# Patient Record
Sex: Female | Born: 1955
Health system: Southern US, Community
[De-identification: ages and names within clinical notes are randomized; demographics above are authoritative.]

## PROBLEM LIST (undated history)

## (undated) DIAGNOSIS — D649 Anemia, unspecified: Secondary | ICD-10-CM

## (undated) DIAGNOSIS — K2970 Gastritis, unspecified, without bleeding: Secondary | ICD-10-CM

## (undated) DIAGNOSIS — M549 Dorsalgia, unspecified: Secondary | ICD-10-CM

## (undated) DIAGNOSIS — M1990 Unspecified osteoarthritis, unspecified site: Secondary | ICD-10-CM

## (undated) DIAGNOSIS — E119 Type 2 diabetes mellitus without complications: Secondary | ICD-10-CM

## (undated) DIAGNOSIS — I10 Essential (primary) hypertension: Secondary | ICD-10-CM

## (undated) DIAGNOSIS — M25569 Pain in unspecified knee: Secondary | ICD-10-CM

## (undated) HISTORY — DX: Type 2 diabetes mellitus without complications: E11.9

## (undated) HISTORY — DX: Pain in unspecified knee: M25.569

## (undated) HISTORY — PX: OTHER SURGICAL HISTORY: SHX169

## (undated) HISTORY — DX: Essential (primary) hypertension: I10

## (undated) HISTORY — DX: Dorsalgia, unspecified: M54.9

## (undated) HISTORY — DX: Anemia, unspecified: D64.9

## (undated) HISTORY — DX: Gastritis, unspecified, without bleeding: K29.70

---

## 1999-06-21 ENCOUNTER — Encounter: Admission: RE | Admit: 1999-06-21 | Discharge: 1999-06-21 | Payer: Self-pay | Admitting: Neurosurgery

## 2000-10-22 ENCOUNTER — Other Ambulatory Visit: Admission: RE | Admit: 2000-10-22 | Discharge: 2000-10-22 | Payer: Self-pay | Admitting: *Deleted

## 2001-05-22 ENCOUNTER — Encounter: Payer: Self-pay | Admitting: Family Medicine

## 2001-05-22 ENCOUNTER — Ambulatory Visit (HOSPITAL_COMMUNITY): Admission: RE | Admit: 2001-05-22 | Discharge: 2001-05-22 | Payer: Self-pay | Admitting: Family Medicine

## 2001-06-18 ENCOUNTER — Other Ambulatory Visit: Admission: RE | Admit: 2001-06-18 | Discharge: 2001-06-18 | Payer: Self-pay | Admitting: Family Medicine

## 2002-01-27 ENCOUNTER — Emergency Department (HOSPITAL_COMMUNITY): Admission: EM | Admit: 2002-01-27 | Discharge: 2002-01-27 | Payer: Self-pay | Admitting: Emergency Medicine

## 2002-01-27 ENCOUNTER — Encounter: Payer: Self-pay | Admitting: Emergency Medicine

## 2003-05-18 ENCOUNTER — Ambulatory Visit (HOSPITAL_COMMUNITY): Admission: RE | Admit: 2003-05-18 | Discharge: 2003-05-18 | Payer: Self-pay | Admitting: Family Medicine

## 2004-10-05 ENCOUNTER — Ambulatory Visit (HOSPITAL_COMMUNITY): Admission: RE | Admit: 2004-10-05 | Discharge: 2004-10-05 | Payer: Self-pay | Admitting: Family Medicine

## 2005-09-18 ENCOUNTER — Ambulatory Visit (HOSPITAL_COMMUNITY): Admission: RE | Admit: 2005-09-18 | Discharge: 2005-09-18 | Payer: Self-pay | Admitting: Family Medicine

## 2006-09-24 ENCOUNTER — Emergency Department (HOSPITAL_COMMUNITY): Admission: EM | Admit: 2006-09-24 | Discharge: 2006-09-24 | Payer: Self-pay | Admitting: Emergency Medicine

## 2007-04-08 ENCOUNTER — Other Ambulatory Visit: Admission: RE | Admit: 2007-04-08 | Discharge: 2007-04-08 | Payer: Self-pay | Admitting: Obstetrics and Gynecology

## 2007-07-28 ENCOUNTER — Emergency Department (HOSPITAL_COMMUNITY): Admission: EM | Admit: 2007-07-28 | Discharge: 2007-07-28 | Payer: Self-pay | Admitting: Family Medicine

## 2008-04-23 ENCOUNTER — Ambulatory Visit (HOSPITAL_COMMUNITY): Admission: RE | Admit: 2008-04-23 | Discharge: 2008-04-23 | Payer: Self-pay | Admitting: Family Medicine

## 2008-04-27 ENCOUNTER — Other Ambulatory Visit: Admission: RE | Admit: 2008-04-27 | Discharge: 2008-04-27 | Payer: Self-pay | Admitting: Obstetrics and Gynecology

## 2008-05-01 ENCOUNTER — Ambulatory Visit (HOSPITAL_COMMUNITY): Admission: RE | Admit: 2008-05-01 | Discharge: 2008-05-01 | Payer: Self-pay | Admitting: Obstetrics and Gynecology

## 2010-06-29 ENCOUNTER — Other Ambulatory Visit
Admission: RE | Admit: 2010-06-29 | Discharge: 2010-06-29 | Payer: Self-pay | Source: Home / Self Care | Admitting: Obstetrics & Gynecology

## 2011-01-19 ENCOUNTER — Encounter: Payer: Self-pay | Admitting: Gastroenterology

## 2011-01-19 ENCOUNTER — Ambulatory Visit (INDEPENDENT_AMBULATORY_CARE_PROVIDER_SITE_OTHER): Payer: BC Managed Care – PPO | Admitting: Gastroenterology

## 2011-01-19 VITALS — BP 126/77 | HR 86 | Temp 97.7°F | Ht 66.0 in | Wt 272.0 lb

## 2011-01-19 DIAGNOSIS — D649 Anemia, unspecified: Secondary | ICD-10-CM

## 2011-01-19 NOTE — Progress Notes (Signed)
Referring Provider: Ardyth Gal, MD Primary Care Physician:  Harlow Asa, MD, MD Primary Gastroenterologist:  Dr. Darrick Penna   Chief Complaint  Patient presents with  . Anemia    HPI:   Tracey Turner presents today for a visit prior to initial screening colonoscopy. Somewhat overdue. Recent labs with incidental findings of anemia, Hgb 10, Hct 33, Iron slightly low at 33, Ferritin normal at 52. She has started taking supplemental iron. She actually saw Dr. Elnoria Howard prior to [place] for colonoscopy; however, they required payment up front. She has insurance, but was unable to pay the amount they were requesting. She denies abdominal pain. Hx of paper hematochezia in past. Reports hx of hemorrhoids. Denies exacerbation currently. BM at least twice per day. No melena. No N/V, no lack of appetite, has lost 30-40 lbs reportedly over last few years due to DM diagnosis. Denies dysphagia/odynophagia. No reflux.   Hemoccult negative X 3 from Dr. Fletcher Anon office. Remote hx of naprosyn for knee pain in past, stopped after blood work received in June.   Past Medical History  Diagnosis Date  . Knee pain   . Anemia   . Hypertension   . Diabetes mellitus   . Glaucoma     questionable, getting second opinion    Past Surgical History  Procedure Date  . None     Current Outpatient Prescriptions  Medication Sig Dispense Refill  . amLODipine (NORVASC) 10 MG tablet Take 10 mg by mouth daily.        Marland Kitchen aspirin 81 MG tablet Take 81 mg by mouth daily.        . ferrous sulfate 325 (65 FE) MG tablet Take 325 mg by mouth daily with breakfast.        . metFORMIN (GLUCOPHAGE) 1000 MG tablet Take 1,000 mg by mouth 2 (two) times daily with a meal.        . metoprolol (LOPRESSOR) 50 MG tablet Take 50 mg by mouth 2 (two) times daily.        . ranitidine (ZANTAC) 150 MG capsule Take 150 mg by mouth 2 (two) times daily.        . traMADol (ULTRAM) 50 MG tablet Take 50 mg by mouth every 6 (six) hours as needed.        .  Vitamin D, Ergocalciferol, (DRISDOL) 50000 UNITS CAPS Take 50,000 Units by mouth.        . cyclobenzaprine (FLEXERIL) 5 MG tablet       . naproxen (NAPROSYN) 500 MG tablet         Allergies as of 01/19/2011 - Review Complete 01/19/2011  Allergen Reaction Noted  . Ace inhibitors Shortness Of Breath and Swelling 01/19/2011    Family History  Problem Relation Age of Onset  . Colon cancer Neg Hx   . Prostate cancer Father     History   Social History  . Marital Status: Single    Spouse Name: N/A    Number of Children: N/A  . Years of Education: N/A   Occupational History  . [employer]    Social History Main Topics  . Smoking status: Former Smoker -- 1.0 packs/day    Types: Cigarettes  . Smokeless tobacco: Former Neurosurgeon    Quit date: 06/21/2007  . Alcohol Use: No  . Drug Use: No  . Sexually Active: Not on file   Other Topics Concern  . Not on file   Social History Narrative  . No narrative on file  Review of Systems: Gen: Denies any fever, chills, loss of appetite, fatigue, unintentional weight loss. CV: Denies chest pain, heart palpitations, syncope, peripheral edema. Resp: Denies shortness of breath with rest, cough, wheezing. +DOE, relieved with rest.  GI: Denies dysphagia or odynophagia. Denies hematemesis, fecal incontinence, or jaundice.  GU : Denies urinary burning, urinary frequency, urinary incontinence.  MS: Denies joint pain, muscle weakness, cramps, limited movement Derm: Denies rash, itching, dry skin Psych: Denies depression, anxiety, confusion or memory loss  Heme: Denies bruising, bleeding, and enlarged lymph nodes.  Physical Exam: BP 126/77  Pulse 86  Temp(Src) 97.7 F (36.5 C) (Temporal)  Ht 5\' 6"  (1.676 m)  Wt 272 lb (123.378 kg)  BMI 43.90 kg/m2 General:   Alert and oriented. Well-developed, well-nourished, pleasant and cooperative. Obese.  Head:  Normocephalic and atraumatic. Eyes:  Conjunctiva pink, sclera clear, no icterus.      Ears:  Normal auditory acuity. Nose:  No deformity, discharge,  or lesions. Mouth:  No deformity or lesions, mucosa pink and moist.  Neck:  Supple, without mass or thyromegaly. Lungs:  Clear to auscultation bilaterally, without wheezing, rales, or rhonchi.  Heart:  S1, S2 present without murmurs noted.  Abdomen:  +BS, soft, obese, non-tender and non-distended. Without mass or HSM. No rebound or guarding. No hernias noted. Rectal:  Deferred  Msk:  Symmetrical without gross deformities. Normal posture. Extremities:  Without clubbing or edema. Neurologic:  Alert and  oriented x4;  grossly normal neurologically. Skin:  Intact, warm and dry without significant lesions or rashes Cervical Nodes:  No significant cervical adenopathy. Psych:  Alert and cooperative. Normal mood and affect.

## 2011-01-19 NOTE — Patient Instructions (Addendum)
We have set you up for a colonoscopy and possible an endoscopy depending on the findings at time of the procedure.  Do not take your diabetes medicine the morning of the procedure.   Further recommendations to follow.

## 2011-01-20 DIAGNOSIS — D649 Anemia, unspecified: Secondary | ICD-10-CM | POA: Insufficient documentation

## 2011-01-20 NOTE — Assessment & Plan Note (Signed)
55 year old female with likely iron deficiency anemia, found incidentally on routine labs. No prior hx of colonoscopy. Completely devoid of upper GI symptoms at this time. No abdominal pain, constipation, change in bowel habits, melena. Has noted paper hematochezia in past; self-reports hx of hemorrhoids. Taking iron currently. Hemoccult cards negative X 3 from outside office. Needs colonoscopy, possible upper endoscopy depending on findings to assess for occult malignancy. Other non-GI source for anemia remains in differential.  Proceed with colonoscopy (possible EGD) with Dr. Darrick Penna in the near future. The risks, benefits, and alternatives have been discussed in detail with the patient. They state understanding and desire to proceed.  Hold iron X 7 days prior to procedure No diabetes medication morning of procedure

## 2011-01-20 NOTE — Progress Notes (Signed)
Cc to PCP 

## 2011-01-23 ENCOUNTER — Other Ambulatory Visit: Payer: Self-pay | Admitting: General Practice

## 2011-01-23 DIAGNOSIS — D509 Iron deficiency anemia, unspecified: Secondary | ICD-10-CM

## 2011-01-26 NOTE — Progress Notes (Signed)
?   EARLY FeDA V. ACD in light of normal Ferritin. Agree with TCS/?EGD. Pt may have metformin on AM of her procedure.

## 2011-02-10 MED ORDER — SODIUM CHLORIDE 0.45 % IV SOLN
Freq: Once | INTRAVENOUS | Status: AC
  Administered 2011-02-13: 12:00:00 via INTRAVENOUS

## 2011-02-13 ENCOUNTER — Encounter (HOSPITAL_COMMUNITY)
Admission: RE | Disposition: A | Discharge status: Home / Self Care | Payer: Self-pay | Source: Ambulatory Visit | Attending: Gastroenterology

## 2011-02-13 ENCOUNTER — Encounter (HOSPITAL_COMMUNITY): Payer: Self-pay | Admitting: *Deleted

## 2011-02-13 ENCOUNTER — Ambulatory Visit (HOSPITAL_COMMUNITY)
Admission: RE | Admit: 2011-02-13 | Discharge: 2011-02-13 | Disposition: A | Discharge status: Home / Self Care | Payer: BC Managed Care – PPO | Source: Ambulatory Visit | Attending: Gastroenterology | Admitting: Gastroenterology

## 2011-02-13 ENCOUNTER — Other Ambulatory Visit: Payer: Self-pay | Admitting: Gastroenterology

## 2011-02-13 DIAGNOSIS — K648 Other hemorrhoids: Secondary | ICD-10-CM

## 2011-02-13 DIAGNOSIS — K2980 Duodenitis without bleeding: Secondary | ICD-10-CM | POA: Insufficient documentation

## 2011-02-13 DIAGNOSIS — K2970 Gastritis, unspecified, without bleeding: Secondary | ICD-10-CM

## 2011-02-13 DIAGNOSIS — E119 Type 2 diabetes mellitus without complications: Secondary | ICD-10-CM | POA: Insufficient documentation

## 2011-02-13 DIAGNOSIS — Z79899 Other long term (current) drug therapy: Secondary | ICD-10-CM | POA: Insufficient documentation

## 2011-02-13 DIAGNOSIS — D509 Iron deficiency anemia, unspecified: Secondary | ICD-10-CM | POA: Insufficient documentation

## 2011-02-13 DIAGNOSIS — K921 Melena: Secondary | ICD-10-CM | POA: Insufficient documentation

## 2011-02-13 DIAGNOSIS — Z7982 Long term (current) use of aspirin: Secondary | ICD-10-CM | POA: Insufficient documentation

## 2011-02-13 DIAGNOSIS — K2940 Chronic atrophic gastritis without bleeding: Secondary | ICD-10-CM | POA: Insufficient documentation

## 2011-02-13 DIAGNOSIS — Z01812 Encounter for preprocedural laboratory examination: Secondary | ICD-10-CM | POA: Insufficient documentation

## 2011-02-13 DIAGNOSIS — I10 Essential (primary) hypertension: Secondary | ICD-10-CM | POA: Insufficient documentation

## 2011-02-13 DIAGNOSIS — D649 Anemia, unspecified: Secondary | ICD-10-CM

## 2011-02-13 DIAGNOSIS — R195 Other fecal abnormalities: Secondary | ICD-10-CM

## 2011-02-13 DIAGNOSIS — K2990 Gastroduodenitis, unspecified, without bleeding: Secondary | ICD-10-CM

## 2011-02-13 HISTORY — PX: ESOPHAGOGASTRODUODENOSCOPY: SHX5428

## 2011-02-13 HISTORY — PX: COLONOSCOPY: SHX5424

## 2011-02-13 HISTORY — DX: Unspecified osteoarthritis, unspecified site: M19.90

## 2011-02-13 LAB — GLUCOSE, CAPILLARY: Glucose-Capillary: 143 mg/dL — ABNORMAL HIGH (ref 70–99)

## 2011-02-13 SURGERY — COLONOSCOPY
Anesthesia: Moderate Sedation

## 2011-02-13 MED ORDER — OMEPRAZOLE 20 MG PO CPDR: DELAYED_RELEASE_CAPSULE | ORAL | Status: DC

## 2011-02-13 MED ORDER — BUTAMBEN-TETRACAINE-BENZOCAINE 2-2-14 % EX AERO
INHALATION_SPRAY | CUTANEOUS | Status: DC | PRN
  Administered 2011-02-13: 2 via TOPICAL

## 2011-02-13 MED ORDER — MEPERIDINE HCL 100 MG/ML IJ SOLN
INTRAMUSCULAR | Status: DC | PRN
  Administered 2011-02-13: 25 mg via INTRAVENOUS
  Administered 2011-02-13: 50 mg via INTRAVENOUS
  Administered 2011-02-13: 25 mg via INTRAVENOUS

## 2011-02-13 MED ORDER — MIDAZOLAM HCL 5 MG/5ML IJ SOLN
INTRAMUSCULAR | Status: DC | PRN
  Administered 2011-02-13: 1 mg via INTRAVENOUS
  Administered 2011-02-13: 2 mg via INTRAVENOUS
  Administered 2011-02-13 (×2): 1 mg via INTRAVENOUS
  Administered 2011-02-13: 2 mg via INTRAVENOUS

## 2011-02-13 MED ORDER — PROMETHAZINE HCL 25 MG/ML IJ SOLN
INTRAMUSCULAR | Status: DC | PRN
  Administered 2011-02-13: 12.5 mg via INTRAVENOUS

## 2011-02-13 MED ORDER — PROMETHAZINE HCL 25 MG/ML IJ SOLN
INTRAMUSCULAR | Status: AC
  Filled 2011-02-13: qty 1

## 2011-02-13 MED ORDER — MEPERIDINE HCL 100 MG/ML IJ SOLN
INTRAMUSCULAR | Status: AC
  Filled 2011-02-13: qty 2

## 2011-02-13 MED ORDER — MIDAZOLAM HCL 5 MG/5ML IJ SOLN
INTRAMUSCULAR | Status: AC
  Filled 2011-02-13: qty 10

## 2011-02-13 NOTE — H&P (Signed)
Current Vitals       Recorded User        01/19/2011  3:30 PM  Ginger L Walker, NT           BP Pulse Temp (Src) Resp Ht Wt    126/77  86  97.7 F (36.5 C) (Temporal)  N/A  5\' 6"  (1.676 m)  272 lb (123.378 kg)       BMI SpO2 PF LMP    43.90 kg/m2  N/A  N/A  N/A          Progress Notes     Gerrit Halls, NP  01/20/2011 11:36 AM  Signed   Referring Provider: Ardyth Gal, MD Primary Care Physician:  Harlow Asa, MD, MD Primary Gastroenterologist:  Dr. Darrick Penna     Chief Complaint   Patient presents with   .  Anemia      HPI:    Tracey Turner presents today for a visit prior to initial screening colonoscopy. Somewhat overdue. Recent labs with incidental findings of anemia, Hgb 10, Hct 33, Iron slightly low at 33, Ferritin normal at 52. She has started taking supplemental iron. She actually saw Dr. Elnoria Howard prior to [place] for colonoscopy; however, they required payment up front. She has insurance, but was unable to pay the amount they were requesting. She denies abdominal pain. Hx of paper hematochezia in past. Reports hx of hemorrhoids. Denies exacerbation currently. BM at least twice per day. No melena. No N/V, no lack of appetite, has lost 30-40 lbs reportedly over last few years due to DM diagnosis. Denies dysphagia/odynophagia. No reflux.    Hemoccult negative X 3 from Dr. Fletcher Anon office. Remote hx of naprosyn for knee pain in past, stopped after blood work received in June.     Past Medical History   Diagnosis  Date   .  Knee pain     .  Anemia     .  Hypertension     .  Diabetes mellitus     .  Glaucoma         questionable, getting second opinion       Past Surgical History   Procedure  Date   .  None         Current Outpatient Prescriptions   Medication  Sig  Dispense  Refill   .  amLODipine (NORVASC) 10 MG tablet  Take 10 mg by mouth daily.           Marland Kitchen  aspirin 81 MG tablet  Take 81 mg by mouth daily.           .  ferrous sulfate 325 (65 FE) MG tablet  Take 325 mg by  mouth daily with breakfast.           .  metFORMIN (GLUCOPHAGE) 1000 MG tablet  Take 1,000 mg by mouth 2 (two) times daily with a meal.           .  metoprolol (LOPRESSOR) 50 MG tablet  Take 50 mg by mouth 2 (two) times daily.           .  ranitidine (ZANTAC) 150 MG capsule  Take 150 mg by mouth 2 (two) times daily.           .  traMADol (ULTRAM) 50 MG tablet  Take 50 mg by mouth every 6 (six) hours as needed.           .  Vitamin D, Ergocalciferol, (DRISDOL) 50000 UNITS CAPS  Take 50,000 Units by mouth.           .  cyclobenzaprine (FLEXERIL) 5 MG tablet           .  naproxen (NAPROSYN) 500 MG tablet               Allergies as of 01/19/2011 - Review Complete 01/19/2011   Allergen  Reaction  Noted   .  Ace inhibitors  Shortness Of Breath and Swelling  01/19/2011       Family History   Problem  Relation  Age of Onset   .  Colon cancer  Neg Hx     .  Prostate cancer  Father         History       Social History   .  Marital Status:  Single       Spouse Name:  N/A       Number of Children:  N/A   .  Years of Education:  N/A       Occupational History   .  [employer]         Social History Main Topics   .  Smoking status:  Former Smoker -- 1.0 packs/day       Types:  Cigarettes   .  Smokeless tobacco:  Former Neurosurgeon       Quit date:  06/21/2007   .  Alcohol Use:  No   .  Drug Use:  No   .  Sexually Active:  Not on file       Other Topics  Concern   .  Not on file       Social History Narrative   .  No narrative on file      Review of Systems: Gen: Denies any fever, chills, loss of appetite, fatigue, unintentional weight loss. CV: Denies chest pain, heart palpitations, syncope, peripheral edema. Resp: Denies shortness of breath with rest, cough, wheezing. +DOE, relieved with rest.   GI: Denies dysphagia or odynophagia. Denies hematemesis, fecal incontinence, or jaundice.   GU : Denies urinary burning, urinary frequency, urinary incontinence.   MS: Denies  joint pain, muscle weakness, cramps, limited movement Derm: Denies rash, itching, dry skin Psych: Denies depression, anxiety, confusion or memory loss   Heme: Denies bruising, bleeding, and enlarged lymph nodes.   Physical Exam: BP 126/77  Pulse 86  Temp(Src) 97.7 F (36.5 C) (Temporal)  Ht 5\' 6"  (1.676 m)  Wt 272 lb (123.378 kg)  BMI 43.90 kg/m2 General:   Alert and oriented. Well-developed, well-nourished, pleasant and cooperative. Obese.   Head:  Normocephalic and atraumatic. Eyes:  Conjunctiva pink, sclera clear, no icterus.     Ears:  Normal auditory acuity. Nose:  No deformity, discharge,  or lesions. Mouth:  No deformity or lesions, mucosa pink and moist.   Neck:  Supple, without mass or thyromegaly. Lungs:  Clear to auscultation bilaterally, without wheezing, rales, or rhonchi.   Heart:  S1, S2 present without murmurs noted.   Abdomen:  +BS, soft, obese, non-tender and non-distended. Without mass or HSM. No rebound or guarding. No hernias noted. Rectal:  Deferred   Msk:  Symmetrical without gross deformities. Normal posture. Extremities:  Without clubbing or edema. Neurologic:  Alert and  oriented x4;  grossly normal neurologically. Skin:  Intact, warm and dry without significant lesions or rashes Cervical Nodes:  No significant cervical adenopathy. Psych:  Alert and cooperative. Normal mood and affect.  [place] Score  01/20/2011 11:43 AM  Signed Cc to PCP  Jonette Eva, MD  01/26/2011  2:24 PM  Signed ? EARLY FeDA V. ACD in light of normal Ferritin. Agree with TCS/?EGD. Pt may have metformin on AM of her procedure.        Anemia - Gerrit Halls, NP  01/20/2011 11:35 AM  Signed 54 year old female with likely iron deficiency anemia, found incidentally on routine labs. No prior hx of colonoscopy. Completely devoid of upper GI symptoms at this time. No abdominal pain, constipation, change in bowel habits, melena. Has noted paper hematochezia in past; self-reports hx of  hemorrhoids. Taking iron currently. Hemoccult cards negative X 3 from outside office. Needs colonoscopy, possible upper endoscopy depending on findings to assess for occult malignancy. Other non-GI source for anemia remains in differential.   Proceed with colonoscopy (possible EGD) with Dr. Darrick Penna in the near future. The risks, benefits, and alternatives have been discussed in detail with the patient. They state understanding and desire to proceed.  Hold iron X 7 days prior to procedure No diabetes medication morning of procedure

## 2011-02-13 NOTE — Interval H&P Note (Signed)
History and Physical Interval Note:   02/13/2011   11:53 AM   Franne Grip  has presented today for surgery, with the diagnosis of SCREENING COLONOSCOPY,IDA  The various methods of treatment have been discussed with the patient and family. After consideration of risks, benefits and other options for treatment, the patient has consented to  Procedure(s): COLONOSCOPY ESOPHAGOGASTRODUODENOSCOPY (EGD) as a surgical intervention .  I have reviewed the patients' chart and labs.  Questions were answered to the patient's satisfaction.     Jonette Eva  MD

## 2011-02-14 ENCOUNTER — Telehealth: Payer: Self-pay | Admitting: Gastroenterology

## 2011-02-14 NOTE — Telephone Encounter (Signed)
Please call pt. His Bx shows mild gastritis AND DUODENITIS 2o to ASA and Naproxen. Continue OMP 30 minutes prior to meals. OPV in 3 mos and will recheck blood (CBC/Ferritin) at that time. If her blood count or iron is low, she will need the capsule study.

## 2011-02-15 NOTE — Telephone Encounter (Signed)
Results Cc to PCP  

## 2011-02-16 ENCOUNTER — Telehealth: Payer: Self-pay

## 2011-02-16 NOTE — Telephone Encounter (Signed)
LMOM at home and on mobile for a return call.

## 2011-02-16 NOTE — Telephone Encounter (Signed)
Pt returned call and was informed of results. Please see phone note of 02/14/2011. Needs OV in 3 months.

## 2011-02-17 ENCOUNTER — Encounter (HOSPITAL_COMMUNITY): Payer: Self-pay | Admitting: Gastroenterology

## 2011-02-21 NOTE — Telephone Encounter (Signed)
Pt is aware of OV on 12/3 @ 1000 with AS

## 2011-02-23 ENCOUNTER — Encounter: Payer: Self-pay | Admitting: Orthopedic Surgery

## 2011-02-23 ENCOUNTER — Ambulatory Visit (INDEPENDENT_AMBULATORY_CARE_PROVIDER_SITE_OTHER): Payer: BC Managed Care – PPO | Admitting: Orthopedic Surgery

## 2011-02-23 VITALS — Resp 16 | Ht 65.5 in | Wt 270.0 lb

## 2011-02-23 DIAGNOSIS — M1710 Unilateral primary osteoarthritis, unspecified knee: Secondary | ICD-10-CM

## 2011-02-23 MED ORDER — METHYLPREDNISOLONE ACETATE 40 MG/ML IJ SUSP: 40.0000 mg | Freq: Once | INTRAMUSCULAR | Status: DC

## 2011-02-23 NOTE — Patient Instructions (Signed)
You have received a steroid shot. 15% of patients experience increased pain at the injection site with in the next 24 hours. This is best treated with ice and tylenol extra strength 2 tabs every 8 hours. If you are still having pain please call the office.    

## 2011-02-23 NOTE — Telephone Encounter (Signed)
Pt aware of OV for 12/3 at 10 with AS

## 2011-02-23 NOTE — Progress Notes (Signed)
Chief complaint: RIGHT knee pain HPI:(4) 58 female with RIGHT knee pain came on gradually, pain has become constant and associated with swelling.  She was treated with injection and Naprosyn and her pain is now 2/10 she also takes tramadol.  She also take some glucosamine and says that since then Her pain has decreased significantly  ROS:(2) Seasonal ALLERGIES and joint pain and swelling  PFSH: (1)  Past Medical History  Diagnosis Date  . Knee pain   . Anemia   . Hypertension   . Diabetes mellitus   . Glaucoma     questionable, getting second opinion  . Arthritis   . Gastritis   . Back pain    Past Surgical History  Procedure Date  . None   . Colonoscopy 02/13/2011    Procedure: COLONOSCOPY;  Surgeon: Arlyce Harman, MD;  Location: [facility];  Service: Endoscopy;  Laterality: N/A;  11:00AM  . Esophagogastroduodenoscopy 02/13/2011    Procedure: ESOPHAGOGASTRODUODENOSCOPY (EGD);  Surgeon: Arlyce Harman, MD;  Location: [facility];  Service: Endoscopy;  Laterality: N/A;     Physical Exam(12) GENERAL: normal development   CDV: pulses are normal   Skin: normal  Lymph: nodes were not palpable/normal  Psychiatric: awake, alert and oriented  Neuro: normal sensation  MSK RIGHT knee ambulation is normal.  There is no tenderness in the knee.  There is no joint effusion.  The knee flexion is 120.  Strength is normal knee is stable McMurray sign is negative  Imaging: Office films show patellofemoral arthritis with normal tibiofemoral joint space  Assessment: Osteoarthritis    Plan: Continue Ultram and Tylenol arthritis.  Patient is in sitting tolerance with anemia.  Injection today and every 3-4 months as needed.  Orthopaedic education from the [employer] website was given.  Knee  Injection Procedure Note  Pre-operative Diagnosis: right knee oa  Post-operative Diagnosis: same  Indications: pain  Anesthesia: ethyl chloride   Procedure Details   Verbal consent  was obtained for the procedure. Time out was completed.The joint was prepped with alcohol, followed by  Ethyl chloride spray and A 20 gauge needle was inserted into the knee via lateral approach; 4ml 1% lidocaine and 1 ml of depomedrol  was then injected into the joint . The needle was removed and the area cleansed and dressed.  Complications:  None; patient tolerated the procedure well.

## 2011-03-10 LAB — POCT URINALYSIS DIP (DEVICE)
Nitrite: NEGATIVE
Protein, ur: 30 — AB
Urobilinogen, UA: 1
pH: 6

## 2011-05-22 ENCOUNTER — Ambulatory Visit: Payer: BC Managed Care – PPO | Admitting: Gastroenterology

## 2011-11-17 ENCOUNTER — Encounter (HOSPITAL_COMMUNITY): Payer: Self-pay | Admitting: Emergency Medicine

## 2011-11-17 ENCOUNTER — Emergency Department (HOSPITAL_COMMUNITY)
Admission: EM | Admit: 2011-11-17 | Discharge: 2011-11-17 | Disposition: A | Discharge status: Home / Self Care | Payer: No Typology Code available for payment source | Attending: Emergency Medicine | Admitting: Emergency Medicine

## 2011-11-17 ENCOUNTER — Emergency Department (HOSPITAL_COMMUNITY): Payer: No Typology Code available for payment source

## 2011-11-17 DIAGNOSIS — S39012A Strain of muscle, fascia and tendon of lower back, initial encounter: Secondary | ICD-10-CM

## 2011-11-17 DIAGNOSIS — E119 Type 2 diabetes mellitus without complications: Secondary | ICD-10-CM | POA: Insufficient documentation

## 2011-11-17 DIAGNOSIS — I10 Essential (primary) hypertension: Secondary | ICD-10-CM | POA: Insufficient documentation

## 2011-11-17 DIAGNOSIS — S335XXA Sprain of ligaments of lumbar spine, initial encounter: Secondary | ICD-10-CM | POA: Insufficient documentation

## 2011-11-17 DIAGNOSIS — M549 Dorsalgia, unspecified: Secondary | ICD-10-CM | POA: Insufficient documentation

## 2011-11-17 MED ORDER — HYDROCODONE-ACETAMINOPHEN 5-325 MG PO TABS
1.0000 | ORAL_TABLET | Freq: Once | ORAL | Status: AC
  Administered 2011-11-17: 1 via ORAL
  Filled 2011-11-17: qty 1

## 2011-11-17 MED ORDER — OXYCODONE-ACETAMINOPHEN 5-325 MG PO TABS: 1.0000 | ORAL_TABLET | ORAL | Status: AC | PRN
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
## 2011-11-17 NOTE — ED Provider Notes (Signed)
Medical screening examination/treatment/procedure(s) were performed by non-physician practitioner and as supervising physician I was immediately available for consultation/collaboration.   Dayton Bailiff, MD 11/17/11 1341

## 2012-02-09 ENCOUNTER — Encounter: Payer: Self-pay | Admitting: Gastroenterology

## 2012-02-12 ENCOUNTER — Ambulatory Visit (INDEPENDENT_AMBULATORY_CARE_PROVIDER_SITE_OTHER): Payer: BC Managed Care – PPO | Admitting: Gastroenterology

## 2012-02-12 ENCOUNTER — Encounter: Payer: Self-pay | Admitting: Gastroenterology

## 2012-02-12 VITALS — BP 158/83 | HR 90 | Temp 97.6°F | Ht 66.0 in | Wt 269.4 lb

## 2012-02-12 DIAGNOSIS — D649 Anemia, unspecified: Secondary | ICD-10-CM

## 2012-02-12 MED ORDER — PANTOPRAZOLE SODIUM 40 MG PO TBEC: 40.0000 mg | DELAYED_RELEASE_TABLET | Freq: Every day | ORAL | Status: DC
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
## 2012-02-12 NOTE — Assessment & Plan Note (Addendum)
56 year old female with recent reports of heme + stools, historical anemia with questionable iron deficiency anemia. No overt signs of GI bleeding. Hgb overall stable from last year, iron slightly decreased to 29. Need updated ferritin. TCS, EGD on file. Discussed next step as capsule study to complete GI evaluation. As of note, hemoccults obtained while eating red meat. Will obtain updated ifobt.   ifobt Ferritin Possible capsule study in near future

## 2012-02-12 NOTE — Patient Instructions (Addendum)
Please complete the stool sample and return to our office.  Please complete the blood work; once this is done, we may decide if we need to do a capsule endoscopy to look at your small intestines for any source of blood loss.  Follow-up with the nurse practitioner at your job site once your sinus issues have calmed down to assess for any enlarged lymph nodes. I think this is just a reactive process.  Finally, stop Zantac. Start taking Protonix daily. Call us with any issues taking this. We will see you back in 3 months.     Gastroesophageal Reflux Disease, Adult Gastroesophageal reflux disease (GERD) happens when acid from your stomach flows up into the esophagus. When acid comes in contact with the esophagus, the acid causes soreness (inflammation) in the esophagus. Over time, GERD may create small holes (ulcers) in the lining of the esophagus. CAUSES    Increased body weight. This puts pressure on the stomach, making acid rise from the stomach into the esophagus.   Smoking. This increases acid production in the stomach.   Drinking alcohol. This causes decreased pressure in the lower esophageal sphincter (valve or ring of muscle between the esophagus and stomach), allowing acid from the stomach into the esophagus.   Late evening meals and a full stomach. This increases pressure and acid production in the stomach.   A malformed lower esophageal sphincter.  Sometimes, no cause is found. SYMPTOMS    Burning pain in the lower part of the mid-chest behind the breastbone and in the mid-stomach area. This may occur twice a week or more often.   Trouble swallowing.   Sore throat.   Dry cough.   Asthma-like symptoms including chest tightness, shortness of breath, or wheezing.  DIAGNOSIS   Your caregiver may be able to diagnose GERD based on your symptoms. In some cases, X-rays and other tests may be done to check for complications or to check the condition of your stomach and  esophagus. TREATMENT   Your caregiver may recommend over-the-counter or prescription medicines to help decrease acid production. Ask your caregiver before starting or adding any new medicines.   HOME CARE INSTRUCTIONS    Change the factors that you can control. Ask your caregiver for guidance concerning weight loss, quitting smoking, and alcohol consumption.   Avoid foods and drinks that make your symptoms worse, such as:   Caffeine or alcoholic drinks.   Chocolate.   Peppermint or mint flavorings.   Garlic and onions.   Spicy foods.   Citrus fruits, such as oranges, lemons, or limes.   Tomato-based foods such as sauce, chili, salsa, and pizza.   Fried and fatty foods.   Avoid lying down for the 3 hours prior to your bedtime or prior to taking a nap.   Eat small, frequent meals instead of large meals.   Wear loose-fitting clothing. Do not wear anything tight around your waist that causes pressure on your stomach.   Raise the head of your bed 6 to 8 inches with wood blocks to help you sleep. Extra pillows will not help.   Only take over-the-counter or prescription medicines for pain, discomfort, or fever as directed by your caregiver.   Do not take aspirin, ibuprofen, or other nonsteroidal anti-inflammatory drugs (NSAIDs).  SEEK IMMEDIATE MEDICAL CARE IF:    You have pain in your arms, neck, jaw, teeth, or back.   Your pain increases or changes in intensity or duration.   You develop nausea, vomiting, or sweating (  diaphoresis).   You develop shortness of breath, or you faint.   Your vomit is green, yellow, black, or looks like coffee grounds or blood.   Your stool is red, bloody, or black.  These symptoms could be signs of other problems, such as heart disease, gastric bleeding, or esophageal bleeding. MAKE SURE YOU:    Understand these instructions.   Will watch your condition.   Will get help right away if you are not doing well or get worse.  Document  Released: 03/15/2005 Document Revised: 05/25/2011 Document Reviewed: 12/23/2010 [facility] Patient Information 2012 [place], [place].

## 2012-02-13 LAB — CBC WITH DIFFERENTIAL/PLATELET
Hemoglobin: 10.9 g/dL — AB (ref 12.0–16.0)
Iron: 27
MCH: 24.9
MCHC: 30.9
RDW: 19.1

## 2012-02-13 NOTE — Progress Notes (Signed)
Faxed to PCP

## 2012-02-22 NOTE — Progress Notes (Signed)
Please have pt complete the ifobt. I haven't seen that come across yet.  Received updated ferritin, which is 46, slightly decreased but still normal.  Iron low at 27, decreased,  sats 7, decreased.   If ifobt +, consider capsule.

## 2012-02-23 NOTE — Progress Notes (Signed)
Pt called and said she will bring in iFOBT on Mon.

## 2012-02-23 NOTE — Progress Notes (Signed)
LMOM to call.

## 2012-02-26 ENCOUNTER — Ambulatory Visit (INDEPENDENT_AMBULATORY_CARE_PROVIDER_SITE_OTHER): Payer: BC Managed Care – PPO | Admitting: Gastroenterology

## 2012-02-26 DIAGNOSIS — D649 Anemia, unspecified: Secondary | ICD-10-CM

## 2012-02-26 NOTE — Progress Notes (Signed)
Quick Note:  Negative ifobt.  Need ferritin results. If dropping/low ferritin, we will do capsule.  If stable/normal, we will recheck CBC, ferritin, and iron in 6-8 weeks. ______

## 2012-02-29 NOTE — Progress Notes (Signed)
Quick Note:  Outside lab. Reviewed. ______

## 2012-03-06 NOTE — Progress Notes (Signed)
Quick Note:  Tobi Bastos, did you say you had some ferritin results on this pt?? ______

## 2012-03-06 NOTE — Progress Notes (Signed)
Quick Note:  See note of 02/26/2012 Clinical Support. ______

## 2012-03-06 NOTE — Progress Notes (Signed)
Quick Note:  Received updated ferritin, which is 46, slightly decreased but still normal.  Iron low at 27, decreased,  sats 7, decreased.   Due to slowly drifting ferritin, iron, sats, let's proceed with capsule study to complete GI evaluation. I realize the ifobt is negative; however, I want to make sure there is no GI etiology for anemia. ______

## 2012-03-07 ENCOUNTER — Other Ambulatory Visit: Payer: Self-pay | Admitting: Gastroenterology

## 2012-03-07 DIAGNOSIS — R790 Abnormal level of blood mineral: Secondary | ICD-10-CM

## 2012-03-07 NOTE — Progress Notes (Signed)
Quick Note:  Called and informed pt. She said she is home today, and will expect the call to schedule. ______

## 2012-03-07 NOTE — Progress Notes (Signed)
Patient is scheduled for [facility] on Thursday Sept 26th at 7:30 am instructions have been mailed to the patient and she is aware

## 2012-03-08 ENCOUNTER — Encounter (HOSPITAL_COMMUNITY): Payer: Self-pay | Admitting: Pharmacy Technician

## 2012-03-13 ENCOUNTER — Telehealth: Payer: Self-pay | Admitting: Gastroenterology

## 2012-03-13 NOTE — Telephone Encounter (Signed)
Patient is wanting to cancel her [employer] because she was called by the pre service center asking for $500 with no payment options so procedure was cancelled

## 2012-03-14 ENCOUNTER — Encounter (HOSPITAL_COMMUNITY): Admission: RE | Payer: Self-pay | Source: Ambulatory Visit

## 2012-03-14 ENCOUNTER — Ambulatory Visit (HOSPITAL_COMMUNITY)
Admission: RE | Admit: 2012-03-14 | Payer: BC Managed Care – PPO | Source: Ambulatory Visit | Admitting: Gastroenterology

## 2012-03-14 SURGERY — IMAGING PROCEDURE, GI TRACT, INTRALUMINAL, VIA CAPSULE

## 2012-03-18 ENCOUNTER — Other Ambulatory Visit: Payer: Self-pay | Admitting: Gastroenterology

## 2012-03-18 DIAGNOSIS — D649 Anemia, unspecified: Secondary | ICD-10-CM

## 2012-03-26 ENCOUNTER — Ambulatory Visit (HOSPITAL_COMMUNITY)
Admission: RE | Admit: 2012-03-26 | Discharge: 2012-03-26 | Disposition: A | Discharge status: Home / Self Care | Payer: BC Managed Care – PPO | Source: Ambulatory Visit | Attending: Gastroenterology | Admitting: Gastroenterology

## 2012-03-26 ENCOUNTER — Encounter (HOSPITAL_COMMUNITY): Payer: Self-pay | Admitting: *Deleted

## 2012-03-26 ENCOUNTER — Encounter (HOSPITAL_COMMUNITY)
Admission: RE | Disposition: A | Discharge status: Home / Self Care | Payer: Self-pay | Source: Ambulatory Visit | Attending: Gastroenterology

## 2012-03-26 DIAGNOSIS — R195 Other fecal abnormalities: Secondary | ICD-10-CM | POA: Insufficient documentation

## 2012-03-26 DIAGNOSIS — K2940 Chronic atrophic gastritis without bleeding: Secondary | ICD-10-CM | POA: Insufficient documentation

## 2012-03-26 DIAGNOSIS — D649 Anemia, unspecified: Secondary | ICD-10-CM | POA: Insufficient documentation

## 2012-03-26 DIAGNOSIS — K259 Gastric ulcer, unspecified as acute or chronic, without hemorrhage or perforation: Secondary | ICD-10-CM | POA: Insufficient documentation

## 2012-03-26 HISTORY — PX: GIVENS CAPSULE STUDY: SHX5432

## 2012-03-26 SURGERY — IMAGING PROCEDURE, GI TRACT, INTRALUMINAL, VIA CAPSULE

## 2012-03-28 ENCOUNTER — Encounter (HOSPITAL_COMMUNITY): Payer: Self-pay | Admitting: Gastroenterology

## 2012-03-28 ENCOUNTER — Telehealth: Payer: Self-pay | Admitting: Gastroenterology

## 2012-03-28 DIAGNOSIS — K2990 Gastroduodenitis, unspecified, without bleeding: Secondary | ICD-10-CM

## 2012-03-28 DIAGNOSIS — K259 Gastric ulcer, unspecified as acute or chronic, without hemorrhage or perforation: Secondary | ICD-10-CM

## 2012-03-28 DIAGNOSIS — R195 Other fecal abnormalities: Secondary | ICD-10-CM

## 2012-03-28 DIAGNOSIS — K2970 Gastritis, unspecified, without bleeding: Secondary | ICD-10-CM

## 2012-03-28 DIAGNOSIS — D649 Anemia, unspecified: Secondary | ICD-10-CM

## 2012-03-28 HISTORY — PX: GIVENS CAPSULE STUDY: SHX5432

## 2012-03-28 NOTE — Procedures (Signed)
  PRE-OPERATIVE DIAGNOSIS:  ANEMIA-HB 10.5-10.9 SINCE AUG 2012  POST-OPERATIVE DIAGNOSIS:  GASTRITIS/GASTRIC ULCER  SURGEON:  Surgeon(s): Arlyce Harman, MD  PATIENT DATA: WEIGHT 269 LBS, HEIGHT: 51 IN, WAIST: 51 IN, GASTRIC PASSAGE TIME: 2 HR 41 m, SB PASSAGE TIME: 2H 58m  RESULTS: LIMITED views of gastric mucosa due to retained contents. RARE SMALL ULCER. ANTRAL GASTRITIS. No blood in the stomach. NL SB. No masses, ULCERS, or AVMs. LIMITED VIEWS OF THE COLON DUE TO RETAINED CONTENTS.  DIAGNOSIS: NORMOCYTIC ANEMIA/HEME POS STOOLS DUE TO GASTRITIS.  Plan: 1. AVOID ASA AND NSAIDS UNLESS MEDICALLY NECESSARY 2. START PRILOSEC DAILY. ZANTAC PRN. 3. LOW FAT DIET 4. OPV IN 4 MOS

## 2012-03-28 NOTE — Telephone Encounter (Signed)
PLEASE CALL PT. HER GIVENS STUDY SHOWS GASTRIC ULCERS AND GASTRITIS. THIS IS CAUSING HER BORDERLINE LOW BLOOD COUNT. HER BLOOD COUNT HAS REMAINED UNCHANGED SINCE AUG 2012.  SHE SHOULD: 1. AVOID ASA AND NSAIDS UNLESS MEDICALLY NECESSARY 2. START PRILOSEC DAILY. ZANTAC PRN. 3. FOLLOW A LOW FAT DIET. SHE CAN PICK UP A HANDOUT. 4. OPV IN 4 MOS E 30 SLF

## 2012-03-28 NOTE — Telephone Encounter (Signed)
Recall made 

## 2012-04-01 ENCOUNTER — Encounter (HOSPITAL_COMMUNITY): Payer: Self-pay | Admitting: Gastroenterology

## 2012-04-01 NOTE — Telephone Encounter (Signed)
LMOM to call.

## 2012-04-02 ENCOUNTER — Telehealth: Payer: Self-pay

## 2012-04-02 MED ORDER — LANSOPRAZOLE 30 MG PO CPDR: DELAYED_RELEASE_CAPSULE | ORAL | Status: DC

## 2012-04-02 NOTE — Telephone Encounter (Signed)
Pt would like to have prescription Prevacid prescription sent to [facility]  3 month supply at the time.

## 2012-04-02 NOTE — Telephone Encounter (Signed)
Pt called back. She is just going to get the prescription that was sent in locally. She will try first and see how she does. Then if she decides to change to [facility] she will call and let us know.

## 2012-04-02 NOTE — Telephone Encounter (Signed)
Pt returned call and was informed.  

## 2012-04-02 NOTE — Telephone Encounter (Signed)
Pt returned call and was informed. ( Diet mailed) She said she could not take the Prilosec ( it gave her diarrhea). Could not take Protonix ( It drained her and she had to stay in bed all day) She takes the Zantac 75 mg at bedtime and occasionally will take one in the morning.

## 2012-04-02 NOTE — Telephone Encounter (Signed)
LMOM to call.

## 2012-04-02 NOTE — Addendum Note (Signed)
Addended by: [place] on: 04/02/2012 09:01 AM   Modules accepted: Orders

## 2012-04-02 NOTE — Telephone Encounter (Signed)
PLEASE CALL PT. SHE SHOULD TRY PREVACID. THE ZANTAC IS NOT WORKING. I SENT A RX TO [facility].

## 2012-04-04 NOTE — Progress Notes (Signed)
Please see above rec's per Dr. Darrick Penna. (Givens study results, etc).   4 months OV with SLF

## 2012-04-04 NOTE — Progress Notes (Signed)
OCT 2013: GIVENS STUDY SHOWS GASTRIC ULCERS AND GASTRITIS. THIS IS CAUSING HER BORDERLINE LOW BLOOD COUNT. HER BLOOD COUNT HAS REMAINED UNCHANGED SINCE AUG 2012.   SHE SHOULD:  1. AVOID ASA AND NSAIDS UNLESS MEDICALLY NECESSARY  2. START PRILOSEC DAILY. ZANTAC PRN.  3. FOLLOW A LOW FAT DIET. SHE CAN PICK UP A HANDOUT.  4. OPV IN 4 MOS E 30 SLF  REVIEWED.

## 2012-04-08 NOTE — Progress Notes (Signed)
LMOM to call.

## 2012-04-08 NOTE — Progress Notes (Signed)
Pt was informed and diet was mailed on 04/02/2012.

## 2012-04-17 ENCOUNTER — Telehealth: Payer: Self-pay

## 2012-04-17 MED ORDER — LANSOPRAZOLE 30 MG PO CPDR: DELAYED_RELEASE_CAPSULE | ORAL | Status: DC

## 2012-04-17 NOTE — Telephone Encounter (Signed)
Pt called and would like a prescription for her Prevacid ( generic) sent to [facility] at [phone]. She would like a 90 day supply.

## 2012-06-17 ENCOUNTER — Encounter: Payer: Self-pay | Admitting: Gastroenterology

## 2012-09-09 ENCOUNTER — Encounter: Payer: Self-pay | Admitting: Family Medicine

## 2012-09-10 ENCOUNTER — Encounter: Payer: Self-pay | Admitting: *Deleted

## 2012-09-12 ENCOUNTER — Ambulatory Visit (INDEPENDENT_AMBULATORY_CARE_PROVIDER_SITE_OTHER): Payer: BC Managed Care – PPO | Admitting: Family Medicine

## 2012-09-12 ENCOUNTER — Encounter: Payer: Self-pay | Admitting: Family Medicine

## 2012-09-12 VITALS — BP 120/90 | HR 80 | Ht 66.0 in | Wt 273.0 lb

## 2012-09-12 DIAGNOSIS — E1165 Type 2 diabetes mellitus with hyperglycemia: Secondary | ICD-10-CM | POA: Insufficient documentation

## 2012-09-12 DIAGNOSIS — I10 Essential (primary) hypertension: Secondary | ICD-10-CM

## 2012-09-12 DIAGNOSIS — E119 Type 2 diabetes mellitus without complications: Secondary | ICD-10-CM

## 2012-09-12 MED ORDER — TRAMADOL HCL 50 MG PO TABS: 50.0000 mg | ORAL_TABLET | Freq: Every day | ORAL | Status: DC

## 2012-09-12 MED ORDER — METFORMIN HCL 1000 MG PO TABS: ORAL_TABLET | ORAL | Status: DC

## 2012-09-12 MED ORDER — AMLODIPINE BESYLATE 10 MG PO TABS: 10.0000 mg | ORAL_TABLET | Freq: Every day | ORAL | Status: DC

## 2012-09-12 MED ORDER — GLUCOSE BLOOD VI STRP: ORAL_STRIP | Status: DC

## 2012-09-12 MED ORDER — METOPROLOL TARTRATE 50 MG PO TABS: 50.0000 mg | ORAL_TABLET | Freq: Two times a day (BID) | ORAL | Status: DC

## 2012-09-12 NOTE — Patient Instructions (Signed)
Follow up for in-depth visit late July.

## 2012-09-12 NOTE — Progress Notes (Signed)
Subjective:    Patient ID: Tracey Turner, female    DOB: [birth year], 57 y.o.   MRN: [id number]  HPISugars lately running mostly good. A couple low days. But overall numbers are good. Patient arrives office because she has been advised by her work place that we need to attest to her ability to do her job. She brings in a work Camera operator. They found a handicap sticker on her vehicle. In an assisted that her primary care doctor state that she can perform her duties.    Review of Systems  All other systems reviewed and are negative.    Results for orders placed in visit on 02/29/12  CBC WITH DIFFERENTIAL      Result Value Range   WBC 8.0     RBC 4.38     Hemoglobin 10.9 (*) 12.0 - 16.0 g/dL   HCT 35     MCV 66.4     MCH 24.9     MCHC 30.9     RDW 19.1     platelet count 351     Iron 27     TIBC 391     %SAT 7     Ferritin 46.0  9.0 - 150.0 ng/mL       Objective:   Physical Exam Alert no apparent distress. HEENT normal. Lungs clear. Heart regular rate and rhythm. Ankles without edema.       Assessment & Plan:  Impression #1 type 2 diabetes. Control decent #2 workplace concerns see above. Easily 15 minutes spent most in discussion. Plan we'll fill out work note. WSL

## 2012-09-16 ENCOUNTER — Telehealth: Payer: Self-pay | Admitting: Family Medicine

## 2012-09-16 NOTE — Telephone Encounter (Signed)
Patient came by today to get the letter that you dictated for her which I found on your desk, but could not locate the papers that she dropped off to go with it from the Personnel from her work([employer]).  She has to turn the letter and that paperwork in.

## 2012-09-19 ENCOUNTER — Telehealth: Payer: Self-pay | Admitting: Family Medicine

## 2012-09-19 ENCOUNTER — Other Ambulatory Visit: Payer: Self-pay | Admitting: *Deleted

## 2012-09-19 DIAGNOSIS — E119 Type 2 diabetes mellitus without complications: Secondary | ICD-10-CM

## 2012-09-19 MED ORDER — GLUCOSE BLOOD VI STRP: ORAL_STRIP | Status: DC

## 2012-09-19 NOTE — Telephone Encounter (Signed)
Patient needs a prescription for one touch ultra strips 90 day supply sent to [facility] by the end of the day so they will honor and pay for them.  You originally sent in a 30 day.

## 2012-09-19 NOTE — Telephone Encounter (Signed)
Rx written per patient request and dr. orders

## 2013-03-18 LAB — CBC
%SAT: 8
HCT: 35 %
HGB: 10.5 g/dL
platelet count: 333

## 2013-06-10 ENCOUNTER — Encounter: Payer: Self-pay | Admitting: Family Medicine

## 2013-06-10 ENCOUNTER — Ambulatory Visit (INDEPENDENT_AMBULATORY_CARE_PROVIDER_SITE_OTHER): Payer: BC Managed Care – PPO | Admitting: Family Medicine

## 2013-06-10 VITALS — BP 110/78 | Temp 98.6°F | Ht 65.0 in | Wt 276.0 lb

## 2013-06-10 DIAGNOSIS — E119 Type 2 diabetes mellitus without complications: Secondary | ICD-10-CM

## 2013-06-10 DIAGNOSIS — M1710 Unilateral primary osteoarthritis, unspecified knee: Secondary | ICD-10-CM

## 2013-06-10 DIAGNOSIS — I10 Essential (primary) hypertension: Secondary | ICD-10-CM

## 2013-06-10 MED ORDER — CYCLOBENZAPRINE HCL 10 MG PO TABS: 10.0000 mg | ORAL_TABLET | Freq: Three times a day (TID) | ORAL | Status: DC | PRN

## 2013-06-10 MED ORDER — TRAMADOL HCL 50 MG PO TABS: 50.0000 mg | ORAL_TABLET | Freq: Two times a day (BID) | ORAL | Status: DC
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
## 2013-09-08 ENCOUNTER — Ambulatory Visit: Payer: BC Managed Care – PPO | Admitting: Family Medicine

## 2013-09-22 ENCOUNTER — Ambulatory Visit (INDEPENDENT_AMBULATORY_CARE_PROVIDER_SITE_OTHER): Payer: BC Managed Care – PPO | Admitting: Family Medicine

## 2013-09-22 ENCOUNTER — Encounter: Payer: Self-pay | Admitting: Family Medicine

## 2013-09-22 VITALS — BP 128/86 | Ht 65.0 in | Wt 276.0 lb

## 2013-09-22 DIAGNOSIS — E119 Type 2 diabetes mellitus without complications: Secondary | ICD-10-CM

## 2013-09-22 DIAGNOSIS — D649 Anemia, unspecified: Secondary | ICD-10-CM

## 2013-09-22 DIAGNOSIS — I10 Essential (primary) hypertension: Secondary | ICD-10-CM

## 2013-09-22 DIAGNOSIS — IMO0002 Reserved for concepts with insufficient information to code with codable children: Secondary | ICD-10-CM

## 2013-09-22 DIAGNOSIS — M1710 Unilateral primary osteoarthritis, unspecified knee: Secondary | ICD-10-CM

## 2013-09-22 LAB — POCT GLYCOSYLATED HEMOGLOBIN (HGB A1C): HEMOGLOBIN A1C: 7.2

## 2013-09-22 LAB — POCT HEMOGLOBIN: HEMOGLOBIN: 13.2 g/dL (ref 12.2–16.2)

## 2013-09-22 MED ORDER — CYCLOBENZAPRINE HCL 10 MG PO TABS
10.0000 mg | ORAL_TABLET | Freq: Three times a day (TID) | ORAL | Status: DC | PRN
Start: 2013-09-22 — End: 2014-03-13

## 2013-09-22 MED ORDER — METFORMIN HCL 1000 MG PO TABS: ORAL_TABLET | ORAL | Status: DC

## 2013-09-22 MED ORDER — TRAMADOL HCL 50 MG PO TABS: 50.0000 mg | ORAL_TABLET | Freq: Two times a day (BID) | ORAL | Status: DC

## 2013-09-22 MED ORDER — LANSOPRAZOLE 30 MG PO CPDR: DELAYED_RELEASE_CAPSULE | ORAL | Status: DC

## 2013-09-22 MED ORDER — METOPROLOL TARTRATE 50 MG PO TABS: 50.0000 mg | ORAL_TABLET | Freq: Two times a day (BID) | ORAL | Status: DC

## 2013-09-22 NOTE — Patient Instructions (Signed)
Renal function and liver function

## 2013-09-22 NOTE — Progress Notes (Signed)
Subjective:    Patient ID: Tracey Turner, female    DOB: Aug 14, 1955, 58 y.o.   MRN: [id number]  HPI Patient arrives for a follow up on diabetes . Patient reports most of her sugars in good control. Patient states not missing any medications. No significant low sugar spells. Mostly watching diet considerably. Results for orders placed in visit on 09/22/13  POCT GLYCOSYLATED HEMOGLOBIN (HGB A1C)      Result Value Ref Range   Hemoglobin A1C 7.2    POCT HEMOGLOBIN      Result Value Ref Range   Hemoglobin 13.2  12.2 - 16.2 g/dL    . and anemia.   Patient also states she having continued problems with knees.History of arthritis in the knees. Has had an injection in the past which definitely seemed to help.  Claims compliance with blood pressure medicine. No obvious side effects.  Sugars better recently, up at times. Watching diet. Trying to exercise, walking some with exercise.   Review of Systems No chest pain no back pain no abdominal pain no change in bowel habits no blood in stool     Objective:   Physical Exam  Alert HEENT normal vitals reviewed. Lungs clear. Heart regular rate and rhythm. Right knee crepitations palpable. No effusion. No joint laxity. C. diabetic foot exam      Assessment & Plan:  Impression 1 type 2 diabetes good control. # hypertension good control. #3 arthritis right knee discussed. #4 anemia improved. Plan maintain same meds. Diet exercise discussed. Wellness exam strongly encourage. Appropriate blood work to be done through work. Micro-proteinuria now since. Recheck as scheduled. Offered knee injection in the future if patient would like. WSL2

## 2013-09-23 LAB — MICROALBUMIN, URINE: Microalb, Ur: 2.53 mg/dL — ABNORMAL HIGH (ref 0.00–1.89)

## 2013-09-30 ENCOUNTER — Ambulatory Visit (INDEPENDENT_AMBULATORY_CARE_PROVIDER_SITE_OTHER): Payer: BC Managed Care – PPO | Admitting: Family Medicine

## 2013-09-30 ENCOUNTER — Encounter: Payer: Self-pay | Admitting: Family Medicine

## 2013-09-30 VITALS — BP 142/90 | Ht 66.0 in | Wt 282.0 lb

## 2013-09-30 DIAGNOSIS — M1710 Unilateral primary osteoarthritis, unspecified knee: Secondary | ICD-10-CM

## 2013-09-30 DIAGNOSIS — IMO0002 Reserved for concepts with insufficient information to code with codable children: Secondary | ICD-10-CM

## 2013-09-30 NOTE — Progress Notes (Signed)
Subjective:    Patient ID: Tracey Turner, female    DOB: 1955-12-08, 58 y.o.   MRN: [id number]  HPIRight knee pain. Seen on 09/22/13. Would like to get injection today.    Patient has known arthritis in right knee. Injection definitely helped in the past.  Review of Systems Otherwise negative    Objective:   Physical Exam  Vitals stable. Next  Procedure note. Patient was prepped, anesthetized, draped, sterilized, injected with 1 cc Depo-Medrol and 2 cc Xylocaine.      Assessment & Plan:  Impression knee an injection plan local measures discussed.

## 2013-10-28 ENCOUNTER — Other Ambulatory Visit: Payer: Self-pay | Admitting: Family Medicine

## 2013-10-28 DIAGNOSIS — Z1231 Encounter for screening mammogram for malignant neoplasm of breast: Secondary | ICD-10-CM

## 2013-11-03 ENCOUNTER — Ambulatory Visit (HOSPITAL_COMMUNITY)
Admission: RE | Admit: 2013-11-03 | Discharge: 2013-11-03 | Disposition: A | Discharge status: Home / Self Care | Payer: BC Managed Care – PPO | Source: Ambulatory Visit | Attending: Family Medicine | Admitting: Family Medicine

## 2013-11-03 DIAGNOSIS — Z1231 Encounter for screening mammogram for malignant neoplasm of breast: Secondary | ICD-10-CM | POA: Insufficient documentation

## 2013-11-12 ENCOUNTER — Encounter: Payer: Self-pay | Admitting: Nurse Practitioner

## 2013-11-12 ENCOUNTER — Ambulatory Visit (INDEPENDENT_AMBULATORY_CARE_PROVIDER_SITE_OTHER): Payer: BC Managed Care – PPO | Admitting: Nurse Practitioner

## 2013-11-12 VITALS — BP 128/86 | Ht 66.0 in | Wt 282.2 lb

## 2013-11-12 DIAGNOSIS — Z01419 Encounter for gynecological examination (general) (routine) without abnormal findings: Secondary | ICD-10-CM

## 2013-11-12 DIAGNOSIS — Z0000 Encounter for general adult medical examination without abnormal findings: Secondary | ICD-10-CM

## 2013-11-12 DIAGNOSIS — Z124 Encounter for screening for malignant neoplasm of cervix: Secondary | ICD-10-CM

## 2013-11-12 MED ORDER — PHENTERMINE HCL 37.5 MG PO TABS: 37.5000 mg | ORAL_TABLET | Freq: Every day | ORAL | Status: DC

## 2013-11-13 LAB — PAP IG W/ RFLX HPV ASCU

## 2013-11-15 ENCOUNTER — Encounter: Payer: Self-pay | Admitting: Nurse Practitioner

## 2013-11-15 NOTE — Progress Notes (Signed)
Subjective:    Patient ID: Tracey Turner, female    DOB: 11/05/1955, 58 y.o.   MRN: [id number]  HPI presents for her wellness exam. Has labs done at work; will have them fax [place] a copy. Unsure what was included. Had her mammogram in May. Last menses 2009. Has been with her current partner for about 1 1/2 years. Has had a neg colposcopy in the past with normal PAPs since. Regular eye exams. Only has a few teeth; remainder are partial plates. Sugars at home approx 120. No pain or numbness in the feet.     Review of Systems  Constitutional: Negative for activity change, appetite change and fatigue.  HENT: Negative for dental problem, ear pain, mouth sores, sinus pressure and sore throat.   Respiratory: Negative for cough, chest tightness, shortness of breath and wheezing.   Cardiovascular: Negative for chest pain and leg swelling.       Significant superficial varicose veins lower extremities.  Gastrointestinal: Negative for nausea, vomiting, abdominal pain, diarrhea, constipation, blood in stool and abdominal distention.  Genitourinary: Negative for dysuria, urgency, frequency, vaginal bleeding, vaginal discharge, enuresis, difficulty urinating, genital sores and pelvic pain.       Objective:   Physical Exam  Vitals reviewed. Constitutional: She is oriented to person, place, and time. She appears well-developed. No distress.  HENT:  Right Ear: External ear normal.  Left Ear: External ear normal.  Mouth/Throat: Oropharynx is clear and moist.  Neck: Normal range of motion. Neck supple. No tracheal deviation present. No thyromegaly present.  Cardiovascular: Normal rate, regular rhythm and normal heart sounds.  Exam reveals no gallop.   No murmur heard. Pulmonary/Chest: Effort normal and breath sounds normal.  Abdominal: Soft. She exhibits no distension. There is no tenderness.  Genitourinary: Vagina normal and uterus normal. No vaginal discharge found.  External GU: no lesions or rashes.  Vagina no discharge. No CMT. Bimanual exam no tenderness or obvious masses but limited due to abd girth. Rectal exam no masses; no stool for hemoccult.  Musculoskeletal: She exhibits no edema.  Lymphadenopathy:    She has no cervical adenopathy.  Neurological: She is alert and oriented to person, place, and time.  See diabetic foot exam.  Skin: Skin is warm and dry. No rash noted.  Psychiatric: She has a normal mood and affect. Her behavior is normal.  Breast exam: no masses; axillae no adenopathy.        Assessment & Plan:  Well woman exam - Plan: POC Hemoccult Bld/Stl (3-Cd Home Screen)  Screening for cervical cancer - Plan: Pap IG w/ reflex to HPV when ASC-U  Morbid obesity  Meds ordered this encounter  Medications  . phentermine (ADIPEX-P) 37.5 MG tablet    Sig: Take 1 tablet (37.5 mg total) by mouth daily before breakfast.    Dispense:  30 tablet    Refill:  0    Order Specific Question:  Supervising Provider    Answer:  [name] [[id number]]   Encouraged healthy diet, regular exercise and weight loss. Stop phentermine and call if any adverse effects (reviewed with patient).  Also recommend daily calcium and vitamin D. To have her employer fax recent labs.  Return in about 1 month (around 12/13/2013).

## 2013-12-12 ENCOUNTER — Ambulatory Visit (INDEPENDENT_AMBULATORY_CARE_PROVIDER_SITE_OTHER): Payer: BC Managed Care – PPO | Admitting: Nurse Practitioner

## 2013-12-12 ENCOUNTER — Encounter: Payer: Self-pay | Admitting: Nurse Practitioner

## 2013-12-12 VITALS — BP 128/86 | Ht 66.0 in | Wt 283.8 lb

## 2013-12-12 DIAGNOSIS — I10 Essential (primary) hypertension: Secondary | ICD-10-CM

## 2013-12-12 DIAGNOSIS — T50905A Adverse effect of unspecified drugs, medicaments and biological substances, initial encounter: Secondary | ICD-10-CM

## 2013-12-17 ENCOUNTER — Telehealth: Payer: Self-pay | Admitting: *Deleted

## 2013-12-17 NOTE — Telephone Encounter (Signed)
Please review bloodwork results on paper chart. [facility] done at [facility].

## 2013-12-18 ENCOUNTER — Encounter: Payer: Self-pay | Admitting: Nurse Practitioner

## 2013-12-18 NOTE — Progress Notes (Signed)
Subjective:  Presents for recheck. Tried taking the full dose of phentermine 37.5 mg, had to stop after 5 days due to palpitations and nausea. No chest pain or shortness of breath. No edema.  Objective:   BP 128/86  Ht 5\' 6"  (1.676 m)  Wt 283 lb 12.8 oz (128.731 kg)  BMI 45.83 kg/m2 NAD. Alert, oriented. Lungs clear. Heart regular rate rhythm. No murmur or gallop noted. No tachycardia.  Assessment:  Problem List Items Addressed This Visit     Cardiovascular and Mediastinum   HTN (hypertension) - Primary     Other   Morbid obesity    Other Visit Diagnoses   Medication side effect, initial encounter            Plan: Patient wishes to try medication again, reviewed other choices the patient states they're too expensive. Recommend she try a half of a tablet, if side effects persist stop med and call back. Lower dose is available if needed.

## 2014-02-17 NOTE — Telephone Encounter (Signed)
LMRC

## 2014-02-17 NOTE — Telephone Encounter (Signed)
Notify pt we reviewed the blood work, sorry for delay was in a stack on dr [employer]  All bw good except vit d low at 19, if pt follows carolyns advice of two ca and vit d tabs daily ths should fix itself in time for next blood draw

## 2014-02-18 NOTE — Telephone Encounter (Signed)
Discussed with patient

## 2014-03-13 ENCOUNTER — Encounter: Payer: Self-pay | Admitting: Family Medicine

## 2014-03-13 ENCOUNTER — Ambulatory Visit (INDEPENDENT_AMBULATORY_CARE_PROVIDER_SITE_OTHER): Payer: BC Managed Care – PPO | Admitting: Family Medicine

## 2014-03-13 VITALS — BP 138/70 | Ht 66.0 in | Wt 285.0 lb

## 2014-03-13 DIAGNOSIS — Z23 Encounter for immunization: Secondary | ICD-10-CM

## 2014-03-13 DIAGNOSIS — E118 Type 2 diabetes mellitus with unspecified complications: Secondary | ICD-10-CM

## 2014-03-13 DIAGNOSIS — D649 Anemia, unspecified: Secondary | ICD-10-CM

## 2014-03-13 LAB — POCT HEMOGLOBIN: HEMOGLOBIN: 10.3 g/dL — AB (ref 12.2–16.2)

## 2014-03-13 LAB — POCT GLYCOSYLATED HEMOGLOBIN (HGB A1C): Hemoglobin A1C: 7.7

## 2014-03-13 MED ORDER — TRIAMTERENE-HCTZ 37.5-25 MG PO CAPS: 1.0000 | ORAL_CAPSULE | Freq: Every day | ORAL | Status: DC

## 2014-03-13 MED ORDER — AMLODIPINE BESYLATE 10 MG PO TABS: 10.0000 mg | ORAL_TABLET | Freq: Every day | ORAL | Status: DC

## 2014-03-13 MED ORDER — CYCLOBENZAPRINE HCL 10 MG PO TABS: 10.0000 mg | ORAL_TABLET | Freq: Three times a day (TID) | ORAL | Status: DC | PRN

## 2014-03-13 MED ORDER — TRAMADOL HCL 50 MG PO TABS
50.0000 mg | ORAL_TABLET | Freq: Two times a day (BID) | ORAL | Status: DC
Start: 2014-03-13 — End: 2014-06-29

## 2014-03-13 MED ORDER — METOPROLOL TARTRATE 50 MG PO TABS: 50.0000 mg | ORAL_TABLET | Freq: Two times a day (BID) | ORAL | Status: DC

## 2014-03-13 MED ORDER — CITALOPRAM HYDROBROMIDE 20 MG PO TABS: 20.0000 mg | ORAL_TABLET | Freq: Every day | ORAL | Status: DC

## 2014-03-13 MED ORDER — AMLODIPINE BESYLATE 5 MG PO TABS: 5.0000 mg | ORAL_TABLET | Freq: Every day | ORAL | Status: DC

## 2014-03-13 MED ORDER — METFORMIN HCL 1000 MG PO TABS: ORAL_TABLET | ORAL | Status: DC

## 2014-03-13 MED ORDER — LANSOPRAZOLE 30 MG PO CPDR: DELAYED_RELEASE_CAPSULE | ORAL | Status: DC

## 2014-03-13 NOTE — Progress Notes (Signed)
Subjective:    Patient ID: Tracey Turner, female    DOB: Apr 02, 1956, 58 y.o.   MRN: [id number]  Diabetes She presents for her follow-up diabetic visit. She has type 2 diabetes mellitus. Current diabetic treatment includes oral agent (dual therapy). She is compliant with treatment all of the time. She participates in exercise three times a week. Her home blood glucose trend is increasing steadily. Her breakfast blood glucose range is generally 130-140 mg/dl. She does not see a podiatrist.Eye exam is current.  A1C 7.7 today. Patient having fatigue. Hemoglobin today 10.3  glus somewhat elevated, pt has been eating more. depr sec to brothers passing   Depression since brother passed a few months ago.  Dizziness off and on for awhile.  Pt bro had passed away suddenly fr heart disease   Left ankle swelling.  Having bloodwork done at work next month.   Pt req something mild fpor depr . Feeling down. No suicidal thoughts. He chest pain and back pain no abdominal pain no change in bowel habits no blood in stool ROS otherwise than Flu vaccine today.   Review of Systems See above    Objective:   Physical Exam   Alert vitals stable H&T normal. Lungs clear heart rare rhythm. Ankles without major edema. Trace edema actually bilateral left greater than right negative Homans good arterial pulses     Assessment & Plan:  Impression type 2 diabetes decent control but not great discussed #2 hypertension good control. #3 venous stasis exacerbated by Norvasc and discuss #4 depression plan initiate proper therapy for depression. Hemoccult cards x3 of positive will require referral back to GI due to ongoing anemia. Resume iron discussed. Cut down Norvasc and add diuretic rationale discussed. Recheck in several months. WSL

## 2014-04-15 LAB — HM DIABETES EYE EXAM

## 2014-06-15 ENCOUNTER — Ambulatory Visit (INDEPENDENT_AMBULATORY_CARE_PROVIDER_SITE_OTHER): Payer: BC Managed Care – PPO | Admitting: Family Medicine

## 2014-06-15 ENCOUNTER — Encounter: Payer: Self-pay | Admitting: Family Medicine

## 2014-06-15 VITALS — BP 142/82 | Ht 66.0 in | Wt 280.0 lb

## 2014-06-15 DIAGNOSIS — E119 Type 2 diabetes mellitus without complications: Secondary | ICD-10-CM

## 2014-06-15 DIAGNOSIS — K2100 Gastro-esophageal reflux disease with esophagitis, without bleeding: Secondary | ICD-10-CM

## 2014-06-15 DIAGNOSIS — I10 Essential (primary) hypertension: Secondary | ICD-10-CM

## 2014-06-15 DIAGNOSIS — K219 Gastro-esophageal reflux disease without esophagitis: Secondary | ICD-10-CM | POA: Insufficient documentation

## 2014-06-15 LAB — POCT GLYCOSYLATED HEMOGLOBIN (HGB A1C): Hemoglobin A1C: 8.2

## 2014-06-15 MED ORDER — METFORMIN HCL 1000 MG PO TABS: 1000.0000 mg | ORAL_TABLET | Freq: Two times a day (BID) | ORAL | Status: DC

## 2014-06-15 MED ORDER — GLIPIZIDE 5 MG PO TABS: 5.0000 mg | ORAL_TABLET | Freq: Two times a day (BID) | ORAL | Status: DC

## 2014-06-15 NOTE — Progress Notes (Signed)
Subjective:    Patient ID: Tracey Turner, female    DOB: [birth year], 58 y.o.   MRN: [id number]  Diabetes She presents for her follow-up diabetic visit. She has type 2 diabetes mellitus. Hypoglycemia symptoms include sleepiness. There are no diabetic associated symptoms. Current diabetic treatment includes oral agent (monotherapy). She is compliant with treatment all of the time. She rarely participates in exercise. She monitors blood glucose at home 3-4 x per week. Her highest blood glucose is 180-200 mg/dl. Her overall blood glucose range is 130-140 mg/dl. She does not see a podiatrist.Eye exam is current.    BM's in the eve, loose in nature  Swelling is now better with new combo  No diab complic fr eye exam, sl incr pressure eyes  Saw eye doc in oct  Patient notes elevated sugars at times.  Reports overall that her mood has improved.  Reports gastrointestinal symptoms are improved on her medication. Reflux is generally stable  Review of Systems reports loose stools intermittently. No headache no chest pain no back pain ongoing weight gain no shortness of breath no rash ROS otherwise negative    Objective:   Physical Exam Alert vitals stable HEENT normal. Lungs clear. Heart regular in rhythm. Ankles without edema.       Assessment & Plan:  Impression 1 type 2 diabetes suboptimum. #2 hypertension good control #3 reflux discussed #4 depression improved plan add glipizide rationale discussed. Decrease metformin dose. Recheck in several months. Maintain other meds. Diet exercise discussed. WSL

## 2014-06-29 ENCOUNTER — Telehealth: Payer: Self-pay | Admitting: Family Medicine

## 2014-06-29 MED ORDER — TRIAMTERENE-HCTZ 37.5-25 MG PO CAPS: 1.0000 | ORAL_CAPSULE | Freq: Every day | ORAL | Status: DC

## 2014-06-29 MED ORDER — METOPROLOL TARTRATE 50 MG PO TABS: 50.0000 mg | ORAL_TABLET | Freq: Two times a day (BID) | ORAL | Status: DC

## 2014-06-29 MED ORDER — CYCLOBENZAPRINE HCL 10 MG PO TABS: 10.0000 mg | ORAL_TABLET | Freq: Three times a day (TID) | ORAL | Status: DC | PRN

## 2014-06-29 MED ORDER — METFORMIN HCL 1000 MG PO TABS: 1000.0000 mg | ORAL_TABLET | Freq: Two times a day (BID) | ORAL | Status: DC

## 2014-06-29 MED ORDER — LANSOPRAZOLE 30 MG PO CPDR: DELAYED_RELEASE_CAPSULE | ORAL | Status: DC

## 2014-06-29 MED ORDER — AMLODIPINE BESYLATE 5 MG PO TABS: 5.0000 mg | ORAL_TABLET | Freq: Every day | ORAL | Status: DC

## 2014-06-29 MED ORDER — CITALOPRAM HYDROBROMIDE 20 MG PO TABS: 20.0000 mg | ORAL_TABLET | Freq: Every day | ORAL | Status: DC

## 2014-06-29 MED ORDER — GLIPIZIDE 5 MG PO TABS: 5.0000 mg | ORAL_TABLET | Freq: Two times a day (BID) | ORAL | Status: DC

## 2014-06-29 MED ORDER — TRAMADOL HCL 50 MG PO TABS: 50.0000 mg | ORAL_TABLET | Freq: Two times a day (BID) | ORAL | Status: DC

## 2014-06-29 NOTE — Telephone Encounter (Signed)
Rx's sent to pharmacy. Patient notified. 

## 2014-06-29 NOTE — Telephone Encounter (Addendum)
Pt is requesting that all of her meds [facility] except for  Citalopram and the cyclobenzaprine. The scripts that are sent to [facility] needs to be 90 day supply. She needs all of these as soon as possible.

## 2014-06-29 NOTE — Telephone Encounter (Signed)
The [facility] to [facility].

## 2014-06-30 ENCOUNTER — Encounter: Payer: Self-pay | Admitting: Family Medicine

## 2014-09-17 ENCOUNTER — Encounter: Payer: Self-pay | Admitting: Family Medicine

## 2014-09-17 ENCOUNTER — Ambulatory Visit (INDEPENDENT_AMBULATORY_CARE_PROVIDER_SITE_OTHER): Payer: BLUE CROSS/BLUE SHIELD | Admitting: Family Medicine

## 2014-09-17 VITALS — BP 132/84 | Ht 66.0 in | Wt 289.0 lb

## 2014-09-17 DIAGNOSIS — D509 Iron deficiency anemia, unspecified: Secondary | ICD-10-CM | POA: Diagnosis not present

## 2014-09-17 DIAGNOSIS — I10 Essential (primary) hypertension: Secondary | ICD-10-CM | POA: Diagnosis not present

## 2014-09-17 DIAGNOSIS — E119 Type 2 diabetes mellitus without complications: Secondary | ICD-10-CM

## 2014-09-17 NOTE — Progress Notes (Signed)
Subjective:    Patient ID: Tracey Turner, female    DOB: Dec 01, 1955, 59 y.o.   MRN: [id number]  Diabetes She has type 2 diabetes mellitus. Risk factors for coronary artery disease include diabetes mellitus, obesity and hypertension. Current diabetic treatment includes oral agent (dual therapy). She is compliant with treatment all of the time. She has not had a previous visit with a dietitian. She does not see a podiatrist.Eye exam is current.  HgA1c done at work 7.3  Patient having abd pain since bending over Tues. Less pain now, feels it is i a strain. Pain overall has improved somewha  Compliant with blood pressure medicine. Has cut down salt intake. No obvious side effects from the medication.  Had significant blood work at her workplace. It revealed her anemia is stable. Also of note showed low vitamin D. Was prescribed 50,000 units which is causing her difficulties t   and wants to discuss Vit D supplement. Patient states it makes her feel bad.  Review of Systems No headache no chest pain chronic back pain no change in bowel habits upper mid abdominal pain improving worse with motion    Objective:   Physical Exam Alert vital stable HEENT normal lungs clear heart regular rate and rhythm. Ankles without edema       Assessment & Plan:  Impression 1 type 2 diabetes control good #2 hypertension good control #3 abdominal pain muscle strain discussed #4 low vitamin D discussed plan add vitamin D3 1000 units daily. Stop high-dose supplementation diet exercise discussed maintain other medications. Recheck in several months.

## 2014-09-17 NOTE — Patient Instructions (Signed)
Add vitamin d 1000 units every day to the normal calcium vit d combo

## 2014-09-28 ENCOUNTER — Other Ambulatory Visit: Payer: Self-pay | Admitting: Family Medicine

## 2014-10-15 ENCOUNTER — Ambulatory Visit (INDEPENDENT_AMBULATORY_CARE_PROVIDER_SITE_OTHER): Payer: BLUE CROSS/BLUE SHIELD | Admitting: Nurse Practitioner

## 2014-10-15 ENCOUNTER — Ambulatory Visit (HOSPITAL_COMMUNITY)
Admission: RE | Admit: 2014-10-15 | Discharge: 2014-10-15 | Disposition: A | Discharge status: Home / Self Care | Payer: BLUE CROSS/BLUE SHIELD | Source: Ambulatory Visit | Attending: Nurse Practitioner | Admitting: Nurse Practitioner

## 2014-10-15 ENCOUNTER — Encounter: Payer: Self-pay | Admitting: Family Medicine

## 2014-10-15 ENCOUNTER — Encounter: Payer: Self-pay | Admitting: Nurse Practitioner

## 2014-10-15 VITALS — BP 142/80 | Ht 66.0 in | Wt 286.4 lb

## 2014-10-15 DIAGNOSIS — G8929 Other chronic pain: Secondary | ICD-10-CM | POA: Insufficient documentation

## 2014-10-15 DIAGNOSIS — M79675 Pain in left toe(s): Secondary | ICD-10-CM | POA: Diagnosis not present

## 2014-10-15 DIAGNOSIS — R600 Localized edema: Secondary | ICD-10-CM

## 2014-10-15 DIAGNOSIS — M7989 Other specified soft tissue disorders: Secondary | ICD-10-CM | POA: Diagnosis not present

## 2014-10-15 DIAGNOSIS — R0600 Dyspnea, unspecified: Secondary | ICD-10-CM

## 2014-10-15 DIAGNOSIS — M79605 Pain in left leg: Secondary | ICD-10-CM | POA: Diagnosis not present

## 2014-10-15 LAB — URIC ACID: Uric Acid: 5.3 mg/dL (ref 2.5–7.1)

## 2014-10-15 LAB — D-DIMER, QUANTITATIVE: D-DIMER: 0.58 mg/L FEU — ABNORMAL HIGH (ref 0.00–0.49)

## 2014-10-15 LAB — SEDIMENTATION RATE: Sed Rate: 38 mm/hr (ref 0–40)

## 2014-10-16 ENCOUNTER — Other Ambulatory Visit: Payer: Self-pay | Admitting: Nurse Practitioner

## 2014-10-16 ENCOUNTER — Other Ambulatory Visit: Payer: Self-pay | Admitting: *Deleted

## 2014-10-16 ENCOUNTER — Telehealth: Payer: Self-pay | Admitting: *Deleted

## 2014-10-16 DIAGNOSIS — R7989 Other specified abnormal findings of blood chemistry: Secondary | ICD-10-CM

## 2014-10-16 DIAGNOSIS — M25562 Pain in left knee: Secondary | ICD-10-CM

## 2014-10-16 DIAGNOSIS — M7989 Other specified soft tissue disorders: Secondary | ICD-10-CM

## 2014-10-16 MED ORDER — DOXYCYCLINE HYCLATE 100 MG PO TABS
100.0000 mg | ORAL_TABLET | Freq: Two times a day (BID) | ORAL | Status: DC
Start: 2014-10-16 — End: 2014-12-25

## 2014-10-16 MED ORDER — DOXYCYCLINE HYCLATE 100 MG PO TABS: 100.0000 mg | ORAL_TABLET | Freq: Two times a day (BID) | ORAL | Status: DC

## 2014-10-16 NOTE — Telephone Encounter (Signed)
See result note on leg ultrasound. Discussed results with pt. Pt notifed antibiotic sent to pharm. Repeat ultrasound on Thursday may 5th at 4 pm scheduled aph. Pt notified of appt. Pt verbalized understanding.

## 2014-10-18 ENCOUNTER — Encounter: Payer: Self-pay | Admitting: Nurse Practitioner

## 2014-10-18 NOTE — Progress Notes (Signed)
Subjective:  Presents with complaints of unilateral left lower leg edema worse over the past 48 hours. No fever. No chest pain or unusual shortness of breath. We'll get "winded" at times but relates this to being out of shape. Area is nontender except near her left great toe.  Objective:   BP 142/80 mmHg  Ht 5\' 6"  (1.676 m)  Wt 286 lb 6.4 oz (129.91 kg)  BMI 46.25 kg/m2 NAD. Alert, oriented. Lungs clear. Heart regular rate rhythm. 3+ pitting edema noted left lower leg just above the ankle into the foot area. Calf circumference: No significant difference. Strong DP pulse, toes warm with good capillary refill. Very faint erythema and warmth, nontender to palpation. No specific tenderness at the podagra but on the sole of the foot just beneath the left great toe. No lesions.  Assessment: Leg edema, left - Plan: Sedimentation rate, D-dimer, quantitative, Uric acid, Venous Img Lower Unilateral Left  Dyspnea - Plan: Sedimentation rate, D-dimer, quantitative, Uric acid  Pain of left great toe - Plan: Sedimentation rate, D-dimer, quantitative, Uric acid  Plan: Further follow-up based on stat labs. Warm compresses. Elevation. Given work note. Warning signs reviewed. Seek help immediately if any problems.

## 2014-10-22 ENCOUNTER — Ambulatory Visit (HOSPITAL_COMMUNITY)
Admission: RE | Admit: 2014-10-22 | Discharge: 2014-10-22 | Disposition: A | Discharge status: Home / Self Care | Payer: BLUE CROSS/BLUE SHIELD | Source: Ambulatory Visit | Attending: Nurse Practitioner | Admitting: Nurse Practitioner

## 2014-10-22 DIAGNOSIS — M7989 Other specified soft tissue disorders: Secondary | ICD-10-CM | POA: Diagnosis not present

## 2014-10-22 DIAGNOSIS — M79605 Pain in left leg: Secondary | ICD-10-CM | POA: Insufficient documentation

## 2014-11-04 DIAGNOSIS — Z029 Encounter for administrative examinations, unspecified: Secondary | ICD-10-CM

## 2014-12-18 ENCOUNTER — Ambulatory Visit: Payer: BLUE CROSS/BLUE SHIELD | Admitting: Family Medicine

## 2014-12-25 ENCOUNTER — Encounter: Payer: Self-pay | Admitting: Nurse Practitioner

## 2014-12-25 ENCOUNTER — Ambulatory Visit (INDEPENDENT_AMBULATORY_CARE_PROVIDER_SITE_OTHER): Payer: BLUE CROSS/BLUE SHIELD | Admitting: Nurse Practitioner

## 2014-12-25 VITALS — BP 110/82 | Ht 66.0 in | Wt 282.4 lb

## 2014-12-25 DIAGNOSIS — M129 Arthropathy, unspecified: Secondary | ICD-10-CM

## 2014-12-25 DIAGNOSIS — E119 Type 2 diabetes mellitus without complications: Secondary | ICD-10-CM

## 2014-12-25 DIAGNOSIS — K219 Gastro-esophageal reflux disease without esophagitis: Secondary | ICD-10-CM | POA: Diagnosis not present

## 2014-12-25 DIAGNOSIS — I10 Essential (primary) hypertension: Secondary | ICD-10-CM | POA: Diagnosis not present

## 2014-12-25 DIAGNOSIS — M1710 Unilateral primary osteoarthritis, unspecified knee: Secondary | ICD-10-CM

## 2014-12-25 LAB — POCT GLYCOSYLATED HEMOGLOBIN (HGB A1C): Hemoglobin A1C: 6.9

## 2014-12-25 MED ORDER — GLIPIZIDE 5 MG PO TABS: 5.0000 mg | ORAL_TABLET | Freq: Two times a day (BID) | ORAL | Status: DC

## 2014-12-25 MED ORDER — AMLODIPINE BESYLATE 5 MG PO TABS: 5.0000 mg | ORAL_TABLET | Freq: Every day | ORAL | Status: DC

## 2014-12-25 MED ORDER — LANSOPRAZOLE 30 MG PO CPDR: DELAYED_RELEASE_CAPSULE | ORAL | Status: DC

## 2014-12-25 MED ORDER — TRIAMTERENE-HCTZ 37.5-25 MG PO CAPS: 1.0000 | ORAL_CAPSULE | Freq: Every day | ORAL | Status: DC

## 2014-12-25 MED ORDER — CITALOPRAM HYDROBROMIDE 20 MG PO TABS: 20.0000 mg | ORAL_TABLET | Freq: Every day | ORAL | Status: DC

## 2014-12-25 MED ORDER — METOPROLOL TARTRATE 50 MG PO TABS: 50.0000 mg | ORAL_TABLET | Freq: Two times a day (BID) | ORAL | Status: DC

## 2014-12-25 MED ORDER — METFORMIN HCL 1000 MG PO TABS: 1000.0000 mg | ORAL_TABLET | Freq: Two times a day (BID) | ORAL | Status: DC

## 2014-12-25 MED ORDER — TRAMADOL HCL 50 MG PO TABS: 50.0000 mg | ORAL_TABLET | Freq: Two times a day (BID) | ORAL | Status: DC

## 2014-12-25 MED ORDER — CYCLOBENZAPRINE HCL 10 MG PO TABS: 10.0000 mg | ORAL_TABLET | Freq: Three times a day (TID) | ORAL | Status: DC | PRN

## 2014-12-25 MED ORDER — LATANOPROST 0.005 % OP SOLN: 1.0000 [drp] | Freq: Every day | OPHTHALMIC | Status: DC

## 2014-12-26 LAB — MICROALBUMIN, URINE: MICROALBUM., U, RANDOM: 17.3 ug/mL

## 2014-12-27 ENCOUNTER — Encounter: Payer: Self-pay | Admitting: Nurse Practitioner

## 2014-12-27 NOTE — Progress Notes (Signed)
Subjective:  Presents for routine follow up. Reflux well controlled with prevacid. Diet good. Decreased appetite. Limited activity due to chronic knee pain. Gets regular eye exams. No retinopathy. No CP/ischemic type pain or SOB. Compliant with meds.   Objective:   BP 110/82 mmHg  Ht 5\' 6"  (1.676 m)  Wt 282 lb 6 oz (128.084 kg)  BMI 45.60 kg/m2 NAD. Alert, oriented. Lungs clear. Heart RRR. Abdomen obese, non distended non tender.  Hemoglobin A1c 6.9.   Assessment:  Problem List Items Addressed This Visit      Cardiovascular and Mediastinum   HTN (hypertension)   Relevant Medications   triamterene-hydrochlorothiazide (DYAZIDE) 37.5-25 MG per capsule   amLODipine (NORVASC) 5 MG tablet   metoprolol (LOPRESSOR) 50 MG tablet     Digestive   Esophageal reflux   Relevant Medications   lansoprazole (PREVACID) 30 MG capsule     Endocrine   Diabetes - Primary   Relevant Medications   metFORMIN (GLUCOPHAGE) 1000 MG tablet   glipiZIDE (GLUCOTROL) 5 MG tablet   Other Relevant Orders   POCT glycosylated hemoglobin (Hb A1C) (Completed)   Microalbumin, urine (Completed)     Musculoskeletal and Integument   Arthritis of knee   Relevant Medications   cyclobenzaprine (FLEXERIL) 10 MG tablet   traMADol (ULTRAM) 50 MG tablet     Other   Morbid obesity   Relevant Medications   metFORMIN (GLUCOPHAGE) 1000 MG tablet   glipiZIDE (GLUCOTROL) 5 MG tablet     Plan:  Meds ordered this encounter  Medications  . DISCONTD: lansoprazole (PREVACID) 30 MG capsule    Sig: 1 po BID for acid reflux    Dispense:  270 capsule    Refill:  1    Pt had adverse reaction to PRILOSEC AND PROTONIX.    Order Specific Question:  Supervising Provider    Answer:  03-25-1992 [[id number]]  . cyclobenzaprine (FLEXERIL) 10 MG tablet    Sig: Take 1 tablet (10 mg total) by mouth 3 (three) times daily as needed. Muscle spasms    Dispense:  30 tablet    Refill:  5    Order Specific Question:  Supervising  Provider    Answer:  [name] [[id number]]  . citalopram (CELEXA) 20 MG tablet    Sig: Take 1 tablet (20 mg total) by mouth daily.    Dispense:  90 tablet    Refill:  1    Order Specific Question:  Supervising Provider    Answer:  [name] [[id number]]  . triamterene-hydrochlorothiazide (DYAZIDE) 37.5-25 MG per capsule    Sig: Take 1 each (1 capsule total) by mouth daily.    Dispense:  90 capsule    Refill:  1    Order Specific Question:  Supervising Provider    Answer:  03-25-1992 [[id number]]  . metFORMIN (GLUCOPHAGE) 1000 MG tablet    Sig: Take 1 tablet (1,000 mg total) by mouth 2 (two) times daily with a meal.    Dispense:  180 tablet    Refill:  1    Order Specific Question:  Supervising Provider    Answer:  [name] [[id number]]  . amLODipine (NORVASC) 5 MG tablet    Sig: Take 1 tablet (5 mg total) by mouth daily.    Dispense:  90 tablet    Refill:  1    Order Specific Question:  Supervising Provider    Answer:  [name] [[id number]]  . metoprolol (LOPRESSOR) 50 MG  tablet    Sig: Take 1 tablet (50 mg total) by mouth 2 (two) times daily.    Dispense:  180 tablet    Refill:  1    Order Specific Question:  Supervising Provider    Answer:  [name] [[id number]]  . glipiZIDE (GLUCOTROL) 5 MG tablet    Sig: Take 1 tablet (5 mg total) by mouth 2 (two) times daily.    Dispense:  180 tablet    Refill:  1    Order Specific Question:  Supervising Provider    Answer:  [name] [[id number]]  . lansoprazole (PREVACID) 30 MG capsule    Sig: 1 po BID for acid reflux    Dispense:  180 capsule    Refill:  1    Pt had adverse reaction to PRILOSEC AND PROTONIX.    Order Specific Question:  Supervising Provider    Answer:  [name] [[id number]]  . traMADol (ULTRAM) 50 MG tablet    Sig: Take 1 tablet (50 mg total) by mouth 2 (two) times daily. pain    Dispense:  60 tablet    Refill:  5    Order Specific Question:  Supervising Provider    Answer:  [name] [[id number]]  . latanoprost (XALATAN) 0.005 % ophthalmic solution    Sig: Place 1 drop into both eyes at bedtime.    Dispense:  7.5 mL    Refill:  1    Order Specific Question:  Supervising Provider    Answer:  [name] [[id number]]   Discussed importance of healthy diet, regular activity and weight loss. Return in about 3 months (around 03/27/2015) for physical. Plan pneumonia vaccine at that time.

## 2015-03-16 LAB — HM DIABETES EYE EXAM

## 2015-03-24 ENCOUNTER — Encounter: Payer: Self-pay | Admitting: *Deleted

## 2015-03-29 ENCOUNTER — Ambulatory Visit (INDEPENDENT_AMBULATORY_CARE_PROVIDER_SITE_OTHER): Payer: BLUE CROSS/BLUE SHIELD | Admitting: Nurse Practitioner

## 2015-03-29 ENCOUNTER — Encounter: Payer: Self-pay | Admitting: Nurse Practitioner

## 2015-03-29 VITALS — BP 130/78 | Ht 66.0 in | Wt 286.2 lb

## 2015-03-29 DIAGNOSIS — Z01419 Encounter for gynecological examination (general) (routine) without abnormal findings: Secondary | ICD-10-CM

## 2015-03-29 DIAGNOSIS — Z780 Asymptomatic menopausal state: Secondary | ICD-10-CM | POA: Diagnosis not present

## 2015-03-29 DIAGNOSIS — Z1231 Encounter for screening mammogram for malignant neoplasm of breast: Secondary | ICD-10-CM

## 2015-03-29 DIAGNOSIS — Z0000 Encounter for general adult medical examination without abnormal findings: Secondary | ICD-10-CM | POA: Diagnosis not present

## 2015-03-29 DIAGNOSIS — Z23 Encounter for immunization: Secondary | ICD-10-CM

## 2015-03-30 ENCOUNTER — Encounter: Payer: Self-pay | Admitting: Nurse Practitioner

## 2015-03-30 NOTE — Progress Notes (Signed)
Subjective:    Patient ID: Tracey Turner, female    DOB: 1955/06/27, 59 y.o.   MRN: [id number]  HPI presents for her wellness exam. Overall healthy diet. Due to have labs at work next week. Will send [place] a copy. No vaginal bleeding or pelvic pain. No new sexual partners. Regular vision exams. Has dentures. No oral lesions or sores. Sees Dr. Darrick Penna for GI problems. Walking for exercise.     Review of Systems  Constitutional: Negative for activity change, appetite change and fatigue.  HENT: Negative for dental problem, ear pain, sinus pressure and sore throat.   Respiratory: Negative for cough, chest tightness, shortness of breath and wheezing.   Cardiovascular: Negative for chest pain.       Varicose veins with occasional swelling.   Gastrointestinal: Negative for nausea, vomiting, abdominal pain, diarrhea, constipation, blood in stool and abdominal distention.  Genitourinary: Negative for dysuria, urgency, frequency, vaginal bleeding, vaginal discharge, enuresis, difficulty urinating, genital sores and pelvic pain.       Objective:   Physical Exam  Constitutional: She is oriented to person, place, and time. She appears well-developed. No distress.  Large waist circumference.   HENT:  Right Ear: External ear normal.  Left Ear: External ear normal.  Mouth/Throat: Oropharynx is clear and moist.  Neck: Normal range of motion. Neck supple. No tracheal deviation present. No thyromegaly present.  Cardiovascular: Normal rate, regular rhythm and normal heart sounds.  Exam reveals no gallop.   No murmur heard. Significant varicose veins noted. Trace pitting edema.   Pulmonary/Chest: Effort normal and breath sounds normal.  Abdominal: Soft. She exhibits no distension. There is no tenderness.  Genitourinary: Vagina normal and uterus normal. No vaginal discharge found.  External GU: no lesions or rash noted. Vagina: no discharge. Bimanual exam: no tenderness or obvious masses, but exam very  limited due to abd girth. Rectal exam: no masses; no stool for hemoccult.   Musculoskeletal: She exhibits edema.  Mild kyphosis noted.   Lymphadenopathy:    She has no cervical adenopathy.  Neurological: She is alert and oriented to person, place, and time.  Skin: Skin is warm and dry. No rash noted.  Psychiatric: She has a normal mood and affect. Her behavior is normal.  Vitals reviewed. breast exam: no masses; axillae no adenopathy. Diabetic Foot Exam - Simple   Simple Foot Form  Diabetic Foot exam was performed with the following findings:  Yes 03/29/2015  8:45 AM  Visual Inspection  No deformities, no ulcerations, no other skin breakdown bilaterally:  Yes  Sensation Testing  Intact to touch and monofilament testing bilaterally:  Yes  Pulse Check  See comments:  Yes  Comments  DP pulses present bilat. Skin very dry but intact.            Assessment & Plan:   Problem List Items Addressed This Visit      Other   Morbid obesity (HCC)    Other Visit Diagnoses    Well woman exam    -  Primary    Encounter for immunization        Encounter for screening mammogram for malignant neoplasm of breast        Relevant Orders    MM DIGITAL SCREENING BILATERAL    Menopause        Relevant Orders    DG Bone Density      Recommend weight loss, regular activity and daily vitamin D and calcium supplement.  Return in about  3 months (around 06/29/2015) for diabetes check up.

## 2015-04-05 ENCOUNTER — Ambulatory Visit (HOSPITAL_COMMUNITY)
Admission: RE | Admit: 2015-04-05 | Discharge: 2015-04-05 | Disposition: A | Discharge status: Home / Self Care | Payer: BLUE CROSS/BLUE SHIELD | Source: Ambulatory Visit | Attending: Nurse Practitioner | Admitting: Nurse Practitioner

## 2015-04-05 DIAGNOSIS — Z780 Asymptomatic menopausal state: Secondary | ICD-10-CM | POA: Insufficient documentation

## 2015-04-05 DIAGNOSIS — E559 Vitamin D deficiency, unspecified: Secondary | ICD-10-CM | POA: Diagnosis not present

## 2015-04-05 DIAGNOSIS — Z1231 Encounter for screening mammogram for malignant neoplasm of breast: Secondary | ICD-10-CM | POA: Insufficient documentation

## 2015-04-05 DIAGNOSIS — M069 Rheumatoid arthritis, unspecified: Secondary | ICD-10-CM | POA: Insufficient documentation

## 2015-04-22 ENCOUNTER — Ambulatory Visit (INDEPENDENT_AMBULATORY_CARE_PROVIDER_SITE_OTHER): Payer: BLUE CROSS/BLUE SHIELD | Admitting: Gastroenterology

## 2015-04-22 ENCOUNTER — Encounter: Payer: Self-pay | Admitting: Gastroenterology

## 2015-04-22 DIAGNOSIS — K219 Gastro-esophageal reflux disease without esophagitis: Secondary | ICD-10-CM

## 2015-04-22 NOTE — Assessment & Plan Note (Signed)
SYMPTOMS NOT IDEALLY CONTROLLED DUE TO DIETARY NONADHERENCE/WEIGHT GAIN.  DRINK WATER TO KEEP YOUR URINE LIGHT YELLOW. CONTINUE WEIGHT LOSS EFFORTS. FOLLOW A LOW FAT/DIABETIC diet.  CONTINUE PREVACID. TAKE 30 MINUTES PRIOR TO BREAKFAST and supper. AVOID FOOD THAT TRIGGERS REFLUX.  HANDOUT GIVEN. FOLLOW UP IN 1 YEAR.  CALL IF YOUR REFLUX FAILS TO RESPOND TO THE PREVACID TWICE DAILY.

## 2015-04-22 NOTE — Patient Instructions (Addendum)
CONTINUE YOUR WEIGHT LOSS EFFORTS. LOSE 10 LBS.  COMPLETE LABS AND HAVE THEM FAXED TO [phone].  DRINK WATER TO KEEP YOUR URINE LIGHT YELLOW.  FOLLOW A LOW FAT/DIABETIC diet. MEATS SHOULD BE BAKED, BROILED, OR BOILED. AVOID FRIED FOODS.  CONTINUE PREVACID. TAKE 30 MINUTES PRIOR TO BREAKFAST and supper.  AVOID FOOD THAT TRIGGERS REFLUX. SEE INFO BELOW.   FOLLOW UP IN 1 YEAR.   PLEASE CALL IF YOUR REFLUX FAILS TO RESPOND TO THE PREVACID TWICE DAILY.   Lifestyle and home remedies TO MANAGE REFLUX/CHEST PAIN  You may eliminate or reduce the frequency of heartburn by making the following lifestyle changes:  . Control your weight. Being overweight is a major risk factor for heartburn and GERD. Excess pounds put pressure on your abdomen, pushing up your stomach and causing acid to back up into your esophagus.   . Eat smaller meals. 4 TO 6 MEALS A DAY. This reduces pressure on the lower esophageal sphincter, helping to prevent the valve from opening and acid from washing back into your esophagus.   [place] Earing your belt. Clothes that fit tightly around your waist put pressure on your abdomen and the lower esophageal sphincter.   . Eliminate heartburn triggers. Everyone has specific triggers. Common triggers such as fatty or fried foods, spicy food, tomato sauce, carbonated beverages, alcohol, chocolate, mint, garlic, onion, caffeine and nicotine may make heartburn worse.   Marland Kitchen Avoid stooping or bending. Tying your shoes is OK. Bending over for longer periods to weed your garden isn't, especially soon after eating.   . Don't lie down after a meal. Wait at least three to four hours after eating before going to bed, and don't lie down right after eating.   Marland Kitchen PUT THE HEAD OF YOUR BED ON 6 INCH BLOCKS.   Alternative medicine . Several home remedies exist for treating GERD, but they provide only temporary relief. They include drinking baking soda (sodium bicarbonate) added to water or  drinking other fluids such as baking soda mixed with cream of tartar and water.  . Although these liquids create temporary relief by neutralizing, washing away or buffering acids, eventually they aggravate the situation by adding gas and fluid to your stomach, increasing pressure and causing more acid reflux. Further, adding more sodium to your diet may increase your blood pressure and add stress to your heart, and excessive bicarbonate ingestion can alter the acid-base balance in your body.   Low-Fat Diet BREADS, CEREALS, PASTA, RICE, DRIED PEAS, AND BEANS These products are high in carbohydrates and most are low in fat. Therefore, they can be increased in the diet as substitutes for fatty foods. They too, however, contain calories and should not be eaten in excess. Cereals can be eaten for snacks as well as for breakfast.   FRUITS AND VEGETABLES It is good to eat fruits and vegetables. Besides being sources of fiber, both are rich in vitamins and some minerals. They help you get the daily allowances of these nutrients. Fruits and vegetables can be used for snacks and desserts.  MEATS Limit lean meat, chicken, [place], and fish to no more than 6 ounces per day. Beef, Pork, and Lamb Use lean cuts of beef, pork, and lamb. Lean cuts include:  Extra-lean ground beef.  Arm roast.  Sirloin tip.  Center-cut ham.  Round steak.  Loin chops.  Rump roast.  Tenderloin.  Trim all fat off the outside of meats before cooking. It is not necessary to severely decrease the intake of  red meat, but lean choices should be made. Lean meat is rich in protein and contains a highly absorbable form of iron. Premenopausal women, in particular, should avoid reducing lean red meat because this could increase the risk for low red blood cells (iron-deficiency anemia).  Chicken and [place] These are good sources of protein. The fat of poultry can be reduced by removing the skin and underlying fat layers before cooking.  Chicken and [place] can be substituted for lean red meat in the diet. Poultry should not be fried or covered with high-fat sauces. Fish and Shellfish Fish is a good source of protein. Shellfish contain cholesterol, but they usually are low in saturated fatty acids. The preparation of fish is important. Like chicken and [place], they should not be fried or covered with high-fat sauces. EGGS Egg whites contain no fat or cholesterol. They can be eaten often. Try 1 to 2 egg whites instead of whole eggs in recipes or use egg substitutes that do not contain yolk. MILK AND DAIRY PRODUCTS Use skim or 1% milk instead of 2% or whole milk. Decrease whole milk, natural, and processed cheeses. Use nonfat or low-fat (2%) cottage cheese or low-fat cheeses made from vegetable oils. Choose nonfat or low-fat (1 to 2%) yogurt. Experiment with evaporated skim milk in recipes that call for heavy cream. Substitute low-fat yogurt or low-fat cottage cheese for sour cream in dips and salad dressings. Have at least 2 servings of low-fat dairy products, such as 2 glasses of skim (or 1%) milk each day to help get your daily calcium intake. FATS AND OILS Reduce the total intake of fats, especially saturated fat. Butterfat, lard, and beef fats are high in saturated fat and cholesterol. These should be avoided as much as possible. Vegetable fats do not contain cholesterol, but certain vegetable fats, such as coconut oil, palm oil, and palm kernel oil are very high in saturated fats. These should be limited. These fats are often used in bakery goods, processed foods, popcorn, oils, and nondairy creamers. Vegetable shortenings and some peanut butters contain hydrogenated oils, which are also saturated fats. Read the labels on these foods and check for saturated vegetable oils. Unsaturated vegetable oils and fats do not raise blood cholesterol. However, they should be limited because they are fats and are high in calories. Total fat should  still be limited to 30% of your daily caloric intake. Desirable liquid vegetable oils are corn oil, cottonseed oil, olive oil, canola oil, safflower oil, soybean oil, and sunflower oil. Peanut oil is not as good, but small amounts are acceptable. Buy a heart-healthy tub margarine that has no partially hydrogenated oils in the ingredients. Mayonnaise and salad dressings often are made from unsaturated fats, but they should also be limited because of their high calorie and fat content. Seeds, nuts, peanut butter, olives, and avocados are high in fat, but the fat is mainly the unsaturated type. These foods should be limited mainly to avoid excess calories and fat. OTHER EATING TIPS Snacks  Most sweets should be limited as snacks. They tend to be rich in calories and fats, and their caloric content outweighs their nutritional value. Some good choices in snacks are graham crackers, melba toast, soda crackers, bagels (no egg), English muffins, fruits, and vegetables. These snacks are preferable to snack crackers, [place] fries, TORTILLA CHIPS, and POTATO chips. Popcorn should be air-popped or cooked in small amounts of liquid vegetable oil. Desserts Eat fruit, low-fat yogurt, and fruit ices instead of pastries, cake, and  cookies. Sherbet, angel food cake, gelatin dessert, frozen low-fat yogurt, or other frozen products that do not contain saturated fat (pure fruit juice bars, frozen ice pops) are also acceptable.  COOKING METHODS Choose those methods that use little or no fat. They include: Poaching.  Braising.  Steaming.  Grilling.  Baking.  Stir-frying.  Broiling.  Microwaving.  Foods can be cooked in a nonstick pan without added fat, or use a nonfat cooking spray in regular cookware. Limit fried foods and avoid frying in saturated fat. Add moisture to lean meats by using water, broth, cooking wines, and other nonfat or low-fat sauces along with the cooking methods mentioned above. Soups and stews  should be chilled after cooking. The fat that forms on top after a few hours in the refrigerator should be skimmed off. When preparing meals, avoid using excess salt. Salt can contribute to raising blood pressure in some people.  EATING AWAY FROM HOME Order entres, potatoes, and vegetables without sauces or butter. When meat exceeds the size of a deck of cards (3 to 4 ounces), the rest can be taken home for another meal. Choose vegetable or fruit salads and ask for low-calorie salad dressings to be served on the side. Use dressings sparingly. Limit high-fat toppings, such as bacon, crumbled eggs, cheese, sunflower seeds, and olives. Ask for heart-healthy tub margarine instead of butter.

## 2015-04-22 NOTE — Progress Notes (Signed)
cc'ed to pcp °

## 2015-04-22 NOTE — Assessment & Plan Note (Addendum)
WEIGHT UP ~20 LBS SINCE 2013 & LIKELY CONTRIBUTING TO UNCONTROLLED GERD/NEED TO INCREASE PREVACID.   SCREEN FOR [facility]. DRAW HFP AT WORK AND FAX TO [facility]. DRINK WATER TO KEEP YOUR URINE LIGHT YELLOW. CONTINUE WEIGHT LOSS EFFORTS. FOLLOW A LOW FAT/DIABETIC diet.  CONTINUE PREVACID. TAKE 30 MINUTES PRIOR TO BREAKFAST and supper. AVOID FOOD THAT TRIGGERS REFLUX.  HANDOUT GIVEN. FOLLOW UP IN 1 YEAR.  CALL IF YOUR REFLUX FAILS TO RESPOND TO THE PREVACID TWICE DAILY.

## 2015-04-22 NOTE — Progress Notes (Signed)
ON RECALL  °

## 2015-04-22 NOTE — Progress Notes (Signed)
Subjective:    Patient ID: Tracey Turner, female    DOB: 12-30-1955, 59 y.o.   MRN: [id number]  Lubertha South, MD  HPI May have burning sensation in her epigastrium and under her breast. NOT SMOKING SINCE 2009. WAS HAVING TROUBLE WITH BURNING PAIN ~1 MO AGO. HAD SINUS INFECTION A  COUPLE OF WEEKS AGO. MAY GET NAUSEATED. FEELS LIKE GOING TO THROW UP BUT DOESN'T/ HAPPEN 1-2X/MO. NOW TAKING PREVACID BID(AM/PM). CHANGING TO MAIL ORDER JAN 2017. BOWEL MOVE GOOD. [employer] USE AND WITH SML AMOUNT OF BRBPR. NO DIARRHEA BUT CAN HAVE LOOSE STOOLS: 1-2X/WK FOR PAST MO. MAY HAVE ACID REGURGITATION: 1-2X/WEEK. WEIGHT: 269 LBS 2013 AND NOW 288 LBS.  PT DENIES FEVER, CHILLS, HEMATEMESIS, vomiting, melena, CHEST PAIN, SHORTNESS OF BREATH, CHANGE IN BOWEL IN HABITS,  OR problems swallowing.   Past Medical History  Diagnosis Date  . Knee pain   . Anemia   . Hypertension   . Diabetes mellitus   . Glaucoma     questionable, getting second opinion  . Arthritis   . Gastritis   . Back pain      Past Surgical History  Procedure Laterality Date  . None    . Colonoscopy  02/13/2011    internal hemorrhoids  . Esophagogastroduodenoscopy  02/13/2011    mild gatritis  . Givens capsule study  03/28/2012       . Givens capsule study  03/26/2012    Procedure: GIVENS CAPSULE STUDY;  Surgeon: West Bali, MD;  Location: [facility];  Service: Endoscopy;  Laterality: N/A;  7:30   Allergies  Allergen Reactions  . Ace Inhibitors Shortness Of Breath and Swelling  . Amoxicillin     Nausea/aggravates gastritis  . Nsaids Other (See Comments)    Gastritis, sharp decrease in iron  . Prilosec [Omeprazole]     diarrhea  . Victoza [Liraglutide] Other (See Comments)    "out of it" while taking med    Current Outpatient Prescriptions  Medication Sig Dispense Refill  . acetaminophen (TYLENOL) 650 MG CR tablet Take 650 mg by mouth every 8 (eight) hours as needed. arthritis    . amLODipine  (NORVASC) 5 MG tablet Take 1 tablet (5 mg total) by mouth daily. 90 tablet 1  . azithromycin (ZITHROMAX) 250 MG tablet     . benzonatate (TESSALON) 100 MG capsule     . Calcium Carbonate-Vitamin D (CALCIUM + D PO) Take by mouth.    . cetirizine (ZYRTEC) 10 MG tablet Take 10 mg by mouth daily.    . citalopram (CELEXA) 20 MG tablet Take 1 tablet (20 mg total) by mouth daily. 90 tablet 1  . cyclobenzaprine (FLEXERIL) 10 MG tablet Take 1 tablet (10 mg total) by mouth 3 (three) times daily as needed. Muscle spasms 30 tablet 5  . ferrous sulfate dried (SLOW FE) 160 (50 FE) MG TBCR Take 160 mg by mouth 3 (three) times daily with meals.     . fluticasone (FLONASE) 50 MCG/ACT nasal spray     . glipiZIDE (GLUCOTROL) 5 MG tablet Take 1 tablet (5 mg total) by mouth 2 (two) times daily. 180 tablet 1  . glucose blood (ONE TOUCH ULTRA TEST) test strip Use as instructed 100 each 11  . HYDROcodone-homatropine (HYCODAN) 5-1.5 MG/5ML syrup     . lansoprazole (PREVACID) 30 MG capsule 1 po BID for acid reflux 180 capsule 1  . latanoprost (XALATAN) 0.005 % ophthalmic solution Place 1 drop into both eyes  at bedtime. 7.5 mL 1  . metFORMIN (GLUCOPHAGE) 1000 MG tablet Take 1 tablet (1,000 mg total) by mouth 2 (two) times daily with a meal. 180 tablet 1  . metoprolol (LOPRESSOR) 50 MG tablet Take 1 tablet (50 mg total) by mouth 2 (two) times daily. 180 tablet 1  . Multiple Vitamin (MULTIVITAMIN WITH MINERALS) TABS Take 1 tablet by mouth daily.    . traMADol (ULTRAM) 50 MG tablet Take 1 tablet (50 mg total) by mouth 2 (two) times daily. pain 60 tablet 5  . triamterene-hydrochlorothiazide (DYAZIDE) 37.5-25 MG per capsule Take 1 each (1 capsule total) by mouth daily. 90 capsule 1   Current Facility-Administered Medications  Medication Dose Route Frequency Provider Last Rate Last Dose  . methylPREDNISolone acetate (DEPO-MEDROL) injection 40 mg  40 mg Intra-articular Once Vickki Hearing, MD         Review of  Systems     Objective:   Physical Exam        Assessment & Plan:

## 2015-05-25 ENCOUNTER — Telehealth: Payer: Self-pay | Admitting: Nurse Practitioner

## 2015-05-25 NOTE — Telephone Encounter (Signed)
Patient stated that she last saw Dr. Darrick Penna in November. She has to do some repeat blood work for her in January.

## 2015-05-25 NOTE — Telephone Encounter (Signed)
I reviewed her labs from work dated 05/06/15. She still has chronic anemia and her iron stores have dropped again. When was the last time she saw her GI specialist? Is anyone taking care of this for her at this time?

## 2015-05-25 NOTE — Telephone Encounter (Signed)
Noted  

## 2015-05-25 NOTE — Telephone Encounter (Signed)
LMRC

## 2015-06-30 ENCOUNTER — Ambulatory Visit: Payer: BLUE CROSS/BLUE SHIELD | Admitting: Nurse Practitioner

## 2015-08-24 ENCOUNTER — Ambulatory Visit (INDEPENDENT_AMBULATORY_CARE_PROVIDER_SITE_OTHER): Payer: BLUE CROSS/BLUE SHIELD | Admitting: Nurse Practitioner

## 2015-08-24 ENCOUNTER — Encounter: Payer: Self-pay | Admitting: Nurse Practitioner

## 2015-08-24 VITALS — BP 118/82 | Ht 66.0 in | Wt 294.0 lb

## 2015-08-24 DIAGNOSIS — R609 Edema, unspecified: Secondary | ICD-10-CM | POA: Diagnosis not present

## 2015-08-24 DIAGNOSIS — M1710 Unilateral primary osteoarthritis, unspecified knee: Secondary | ICD-10-CM

## 2015-08-24 DIAGNOSIS — I10 Essential (primary) hypertension: Secondary | ICD-10-CM

## 2015-08-24 DIAGNOSIS — D649 Anemia, unspecified: Secondary | ICD-10-CM | POA: Diagnosis not present

## 2015-08-24 DIAGNOSIS — E119 Type 2 diabetes mellitus without complications: Secondary | ICD-10-CM

## 2015-08-24 DIAGNOSIS — M129 Arthropathy, unspecified: Secondary | ICD-10-CM | POA: Diagnosis not present

## 2015-08-24 LAB — POCT GLYCOSYLATED HEMOGLOBIN (HGB A1C): Hemoglobin A1C: 7.1

## 2015-08-24 LAB — POCT HEMOGLOBIN: Hemoglobin: 12.9 g/dL (ref 12.2–16.2)

## 2015-08-24 MED ORDER — AMLODIPINE BESYLATE 5 MG PO TABS: 5.0000 mg | ORAL_TABLET | Freq: Every day | ORAL | Status: DC

## 2015-08-24 MED ORDER — TRAMADOL HCL 50 MG PO TABS: 50.0000 mg | ORAL_TABLET | Freq: Two times a day (BID) | ORAL | Status: DC

## 2015-08-24 MED ORDER — FLUTICASONE PROPIONATE 50 MCG/ACT NA SUSP: 2.0000 | Freq: Every day | NASAL | Status: DC

## 2015-08-24 MED ORDER — GLUCOSE BLOOD VI STRP: ORAL_STRIP | Status: DC

## 2015-08-24 MED ORDER — TRIAMTERENE-HCTZ 37.5-25 MG PO CAPS: 1.0000 | ORAL_CAPSULE | Freq: Every day | ORAL | Status: DC

## 2015-08-24 MED ORDER — CYCLOBENZAPRINE HCL 10 MG PO TABS: 10.0000 mg | ORAL_TABLET | Freq: Three times a day (TID) | ORAL | Status: DC | PRN

## 2015-08-24 MED ORDER — LATANOPROST 0.005 % OP SOLN: 1.0000 [drp] | Freq: Every day | OPHTHALMIC | Status: DC

## 2015-08-24 MED ORDER — METFORMIN HCL 1000 MG PO TABS: 1000.0000 mg | ORAL_TABLET | Freq: Two times a day (BID) | ORAL | Status: DC

## 2015-08-24 NOTE — Progress Notes (Signed)
Subjective:  Presents for routine follow-up. Has been doing regular walking program as much as possible. Limited due to her chronic orthopedic issues particularly knee pain. Has minimal sodium in her diet but has noticed increase edema over the past several days. Note that she's been out of her fluid pill 2 days. No chest pain or shortness of breath. No orthopnea. History of anemia, would like to have this rechecked today.  Objective:   BP 118/82 mmHg  Ht 5\' 6"  (1.676 m)  Wt 294 lb (133.358 kg)  BMI 47.48 kg/m2 NAD. Alert, oriented. Cheerful affect. Lungs clear. Heart regular rate rhythm. Lower extremities 3+ pitting edema. Results for orders placed or performed in visit on 08/24/15  POCT glycosylated hemoglobin (Hb A1C)  Result Value Ref Range   Hemoglobin A1C 7.1   POCT hemoglobin  Result Value Ref Range   Hemoglobin 12.9 12.2 - 16.2 g/dL     Assessment:  Problem List Items Addressed This Visit      Cardiovascular and Mediastinum   HTN (hypertension)   Relevant Medications   triamterene-hydrochlorothiazide (DYAZIDE) 37.5-25 MG capsule   amLODipine (NORVASC) 5 MG tablet     Endocrine   Diabetes (HCC) - Primary   Relevant Medications   metFORMIN (GLUCOPHAGE) 1000 MG tablet   glucose blood (ONE TOUCH ULTRA TEST) test strip   Other Relevant Orders   POCT glycosylated hemoglobin (Hb A1C) (Completed)     Musculoskeletal and Integument   Arthritis of knee   Relevant Medications   cyclobenzaprine (FLEXERIL) 10 MG tablet   traMADol (ULTRAM) 50 MG tablet     Other   Anemia   Relevant Orders   POCT hemoglobin (Completed)    Other Visit Diagnoses    Peripheral edema          Refills given on medications. Follow-up with GI specialist regarding her GERD and medications. Patient agrees with this plan. Low-sodium diet. Slowly increase activity. Encourage weight loss of 5-10 pounds. Warning signs reviewed. Return in about 3 months (around 11/24/2015) for recheck. Call back sooner  if any problems.

## 2015-09-02 ENCOUNTER — Telehealth: Payer: Self-pay

## 2015-09-02 NOTE — Telephone Encounter (Signed)
Tried to do a PA for lansoprazole 30mg  bid. Pts insurance has denied bid and will only allow her to get it qd. Only way to get it covered is to do an appeal letter.

## 2015-09-02 NOTE — Telephone Encounter (Signed)
Noted  

## 2015-09-03 ENCOUNTER — Encounter: Payer: Self-pay | Admitting: Nurse Practitioner

## 2015-09-03 NOTE — Telephone Encounter (Signed)
Pt is aware.  

## 2015-09-03 NOTE — Telephone Encounter (Signed)
Appeal letter generated and printed. CC sent to PCP and patient. Will await insurance response. Please notify patient.

## 2015-09-03 NOTE — Telephone Encounter (Signed)
Faxed appeal letter to the insurance co

## 2015-09-07 NOTE — Telephone Encounter (Signed)
Somehow, I was contacted for a peer to peer. I informed that patient has tried multiple modalities and OTC agents of BID therapy. MD will pass this on. I am not sure if it will be approved, but it sounds as if it may.

## 2015-09-08 ENCOUNTER — Telehealth: Payer: Self-pay | Admitting: Family Medicine

## 2015-09-08 NOTE — Telephone Encounter (Signed)
Pt dropped off a handicap placard form to be filled out. Pt had one filled out by Eber Jones but the [facility] no longer accepts them if they are signed by a PA. Form is in yellow folder at nurse station.

## 2015-09-09 NOTE — Telephone Encounter (Signed)
Received fax from pts insurance. Appeal was approved. Tried to call pt- NA- LMOM informing her that appeal was approved and she could order her medication now. I believe she gets it thru mail order.  If she has any problems with it, she can call me back.

## 2015-09-22 ENCOUNTER — Telehealth: Payer: Self-pay | Admitting: Nurse Practitioner

## 2015-09-22 NOTE — Telephone Encounter (Signed)
Spoke with patient and patient stated that she last had Colonoscopy about 5 years ago with Dr.Fields at [facility]. She last saw Dr.Fields in November of 2016 and will be returning to see Dr.Fields this November of 2017.

## 2015-09-22 NOTE — Telephone Encounter (Signed)
Left a message for patient to return call. Tracey Turner received fax from [place] of lab orders (see folder in yellow folder at nurses station). Tracey Turner wants to know when last time patient had colonoscopy? Has had any other work-up? When is her next appointment with Dr.Fields?

## 2015-09-22 NOTE — Telephone Encounter (Signed)
noted 

## 2015-11-11 DIAGNOSIS — Z6841 Body Mass Index (BMI) 40.0 and over, adult: Secondary | ICD-10-CM | POA: Diagnosis not present

## 2015-11-11 DIAGNOSIS — D509 Iron deficiency anemia, unspecified: Secondary | ICD-10-CM | POA: Diagnosis not present

## 2015-11-11 DIAGNOSIS — I10 Essential (primary) hypertension: Secondary | ICD-10-CM | POA: Diagnosis not present

## 2015-11-24 ENCOUNTER — Ambulatory Visit: Payer: BLUE CROSS/BLUE SHIELD | Admitting: Nurse Practitioner

## 2015-12-02 DIAGNOSIS — Z6841 Body Mass Index (BMI) 40.0 and over, adult: Secondary | ICD-10-CM | POA: Diagnosis not present

## 2015-12-02 DIAGNOSIS — I10 Essential (primary) hypertension: Secondary | ICD-10-CM | POA: Diagnosis not present

## 2015-12-02 DIAGNOSIS — D509 Iron deficiency anemia, unspecified: Secondary | ICD-10-CM | POA: Diagnosis not present

## 2015-12-16 DIAGNOSIS — Z029 Encounter for administrative examinations, unspecified: Secondary | ICD-10-CM

## 2016-01-11 DIAGNOSIS — Z6841 Body Mass Index (BMI) 40.0 and over, adult: Secondary | ICD-10-CM | POA: Diagnosis not present

## 2016-01-11 DIAGNOSIS — I10 Essential (primary) hypertension: Secondary | ICD-10-CM | POA: Diagnosis not present

## 2016-01-11 DIAGNOSIS — K219 Gastro-esophageal reflux disease without esophagitis: Secondary | ICD-10-CM | POA: Diagnosis not present

## 2016-01-18 DIAGNOSIS — E119 Type 2 diabetes mellitus without complications: Secondary | ICD-10-CM | POA: Diagnosis not present

## 2016-01-18 DIAGNOSIS — D509 Iron deficiency anemia, unspecified: Secondary | ICD-10-CM | POA: Diagnosis not present

## 2016-01-18 DIAGNOSIS — E559 Vitamin D deficiency, unspecified: Secondary | ICD-10-CM | POA: Diagnosis not present

## 2016-01-18 DIAGNOSIS — Z139 Encounter for screening, unspecified: Secondary | ICD-10-CM | POA: Diagnosis not present

## 2016-01-18 DIAGNOSIS — I10 Essential (primary) hypertension: Secondary | ICD-10-CM | POA: Diagnosis not present

## 2016-01-18 DIAGNOSIS — Z6841 Body Mass Index (BMI) 40.0 and over, adult: Secondary | ICD-10-CM | POA: Diagnosis not present

## 2016-01-18 DIAGNOSIS — Z79899 Other long term (current) drug therapy: Secondary | ICD-10-CM | POA: Diagnosis not present

## 2016-01-27 DIAGNOSIS — I10 Essential (primary) hypertension: Secondary | ICD-10-CM | POA: Diagnosis not present

## 2016-01-27 DIAGNOSIS — Z6841 Body Mass Index (BMI) 40.0 and over, adult: Secondary | ICD-10-CM | POA: Diagnosis not present

## 2016-01-27 DIAGNOSIS — D509 Iron deficiency anemia, unspecified: Secondary | ICD-10-CM | POA: Diagnosis not present

## 2016-02-02 ENCOUNTER — Telehealth: Payer: Self-pay | Admitting: Gastroenterology

## 2016-02-02 NOTE — Telephone Encounter (Signed)
REVIEWED LABS AUG 2017-NORMOCYTIC ANEMIA. NEEDS FERRITIN AND OPV E30 WITHIN THE NEXT 2-3 WEEKS TO DISCUSS NEED FOR REPEAT EGD/TCS/? GIVENS.

## 2016-02-03 NOTE — Telephone Encounter (Signed)
CALLED PATIENT AND GAVE HER APPOINTMENT DATE AND TIME

## 2016-02-22 ENCOUNTER — Encounter: Payer: Self-pay | Admitting: Family Medicine

## 2016-02-22 DIAGNOSIS — E119 Type 2 diabetes mellitus without complications: Secondary | ICD-10-CM | POA: Diagnosis not present

## 2016-02-22 DIAGNOSIS — H40053 Ocular hypertension, bilateral: Secondary | ICD-10-CM | POA: Diagnosis not present

## 2016-02-22 DIAGNOSIS — H25813 Combined forms of age-related cataract, bilateral: Secondary | ICD-10-CM | POA: Diagnosis not present

## 2016-02-22 DIAGNOSIS — Z7984 Long term (current) use of oral hypoglycemic drugs: Secondary | ICD-10-CM | POA: Diagnosis not present

## 2016-02-22 LAB — HM DIABETES EYE EXAM

## 2016-02-23 ENCOUNTER — Ambulatory Visit: Payer: BLUE CROSS/BLUE SHIELD | Admitting: Gastroenterology

## 2016-02-24 DIAGNOSIS — Z6841 Body Mass Index (BMI) 40.0 and over, adult: Secondary | ICD-10-CM | POA: Diagnosis not present

## 2016-03-07 ENCOUNTER — Telehealth: Payer: Self-pay | Admitting: Family Medicine

## 2016-03-07 DIAGNOSIS — H209 Unspecified iridocyclitis: Secondary | ICD-10-CM | POA: Diagnosis not present

## 2016-03-07 DIAGNOSIS — H40053 Ocular hypertension, bilateral: Secondary | ICD-10-CM | POA: Diagnosis not present

## 2016-03-07 NOTE — Telephone Encounter (Signed)
Received a fax from of patient's labs ordered by Dr.Cheryl Chestine Spore at [facility] with attention to [place]. Labs in yellow folder on [employer].

## 2016-03-13 DIAGNOSIS — H209 Unspecified iridocyclitis: Secondary | ICD-10-CM | POA: Diagnosis not present

## 2016-03-16 DIAGNOSIS — Z6841 Body Mass Index (BMI) 40.0 and over, adult: Secondary | ICD-10-CM | POA: Diagnosis not present

## 2016-03-16 DIAGNOSIS — E119 Type 2 diabetes mellitus without complications: Secondary | ICD-10-CM | POA: Diagnosis not present

## 2016-03-16 NOTE — Telephone Encounter (Signed)
We will discuss at her appointment in October.

## 2016-03-16 NOTE — Telephone Encounter (Signed)
Patient stated that this is not being handled by the ordering agent and wants it handled by Eber Jones. Patient states she is due her physical in early October and will call back and schedule a physical with Eber Jones to discuss

## 2016-03-16 NOTE — Telephone Encounter (Signed)
Left message to return call 

## 2016-03-16 NOTE — Telephone Encounter (Signed)
She has several issues on her labs. Is the NP at work dealing with this since she is the one that ordered it? Thanks.

## 2016-03-29 DIAGNOSIS — Z23 Encounter for immunization: Secondary | ICD-10-CM | POA: Diagnosis not present

## 2016-03-30 ENCOUNTER — Encounter: Payer: Self-pay | Admitting: Gastroenterology

## 2016-04-05 ENCOUNTER — Encounter: Payer: Self-pay | Admitting: Nurse Practitioner

## 2016-04-05 ENCOUNTER — Ambulatory Visit (INDEPENDENT_AMBULATORY_CARE_PROVIDER_SITE_OTHER): Payer: BLUE CROSS/BLUE SHIELD | Admitting: Nurse Practitioner

## 2016-04-05 VITALS — Ht 66.0 in | Wt 287.0 lb

## 2016-04-05 DIAGNOSIS — D649 Anemia, unspecified: Secondary | ICD-10-CM | POA: Diagnosis not present

## 2016-04-05 DIAGNOSIS — M2550 Pain in unspecified joint: Secondary | ICD-10-CM | POA: Diagnosis not present

## 2016-04-05 DIAGNOSIS — E119 Type 2 diabetes mellitus without complications: Secondary | ICD-10-CM

## 2016-04-05 LAB — POCT HEMOGLOBIN: Hemoglobin: 12.8 g/dL (ref 12.2–16.2)

## 2016-04-05 LAB — POCT GLYCOSYLATED HEMOGLOBIN (HGB A1C): Hemoglobin A1C: 8

## 2016-04-05 NOTE — Progress Notes (Signed)
Subjective:  Presents for routine follow up. Had eye exam recently. Taking daily vitamin D. Also taking iron for chronic anemia. No CP/ischemic type pain or SOB. No pain numbness or tingling in the feet. Has migratory arthralgias. No fever. Occasion mild swelling in the finger joints. Was told she has RA by Dr. Renard Matter which she says was based on an xray of her hand.   Objective:   Ht 5\' 6"  (1.676 m)   Wt 287 lb (130.2 kg)   BMI 46.32 kg/m  NAD. Alert, oriented. Lungs clear. Heart RRR.  Results for orders placed or performed in visit on 04/05/16  POCT hemoglobin  Result Value Ref Range   Hemoglobin 12.8 12.2 - 16.2 g/dL  POCT glycosylated hemoglobin (Hb A1C)  Result Value Ref Range   Hemoglobin A1C 8.0    Diabetic Foot Exam - Simple   Simple Foot Form Diabetic Foot exam was performed with the following findings:  Yes 04/05/2016  9:30 AM  Visual Inspection No deformities, no ulcerations, no other skin breakdown bilaterally:  Yes Sensation Testing Intact to touch and monofilament testing bilaterally:  Yes Pulse Check See comments:  Yes Comments DP pulses present bilat; toes warm with normal cap refill.       Assessment:  Problem List Items Addressed This Visit      Endocrine   Diabetes (HCC) - Primary   Relevant Orders   POCT glycosylated hemoglobin (Hb A1C) (Completed)   Microalbumin / creatinine urine ratio     Other   Anemia   Relevant Orders   POCT hemoglobin (Completed)    Other Visit Diagnoses    Arthralgia of multiple joints       Relevant Orders   Rheumatoid factor   Antinuclear Antib (ANA)     Plan: continue current meds. Advised that A1C goal is around 7.5. Continue weight loss efforts.  Return in about 3 months (around 07/06/2016) for diabetes.

## 2016-04-05 NOTE — Patient Instructions (Signed)
Bydureon Trulicity

## 2016-04-06 DIAGNOSIS — Z6841 Body Mass Index (BMI) 40.0 and over, adult: Secondary | ICD-10-CM | POA: Diagnosis not present

## 2016-04-06 DIAGNOSIS — E119 Type 2 diabetes mellitus without complications: Secondary | ICD-10-CM | POA: Diagnosis not present

## 2016-04-06 LAB — MICROALBUMIN / CREATININE URINE RATIO
Creatinine, Urine: 158.2 mg/dL
MICROALB/CREAT RATIO: 34.3 mg/g{creat} — AB (ref 0.0–30.0)
Microalbumin, Urine: 54.3 ug/mL

## 2016-04-06 LAB — RHEUMATOID FACTOR

## 2016-04-06 LAB — ANA: Anti Nuclear Antibody(ANA): NEGATIVE

## 2016-04-07 ENCOUNTER — Other Ambulatory Visit: Payer: Self-pay | Admitting: *Deleted

## 2016-04-07 ENCOUNTER — Telehealth: Payer: Self-pay | Admitting: Nurse Practitioner

## 2016-04-07 MED ORDER — LANSOPRAZOLE 30 MG PO CPDR: DELAYED_RELEASE_CAPSULE | ORAL | 1 refills | Status: DC

## 2016-04-07 MED ORDER — TRAMADOL HCL 50 MG PO TABS: 50.0000 mg | ORAL_TABLET | Freq: Two times a day (BID) | ORAL | 5 refills | Status: DC

## 2016-04-07 MED ORDER — GLIPIZIDE 5 MG PO TABS: 5.0000 mg | ORAL_TABLET | Freq: Two times a day (BID) | ORAL | 1 refills | Status: DC

## 2016-04-07 MED ORDER — FLUTICASONE PROPIONATE 50 MCG/ACT NA SUSP: 2.0000 | Freq: Every day | NASAL | 5 refills | Status: DC

## 2016-04-07 MED ORDER — METFORMIN HCL 1000 MG PO TABS: 1000.0000 mg | ORAL_TABLET | Freq: Two times a day (BID) | ORAL | 1 refills | Status: DC

## 2016-04-07 MED ORDER — METOPROLOL TARTRATE 50 MG PO TABS
50.0000 mg | ORAL_TABLET | Freq: Two times a day (BID) | ORAL | 1 refills | Status: DC
Start: 2016-04-07 — End: 2016-08-14

## 2016-04-07 MED ORDER — CYCLOBENZAPRINE HCL 10 MG PO TABS: 10.0000 mg | ORAL_TABLET | Freq: Three times a day (TID) | ORAL | 5 refills | Status: DC | PRN

## 2016-04-07 MED ORDER — TRIAMTERENE-HCTZ 37.5-25 MG PO CAPS: 1.0000 | ORAL_CAPSULE | Freq: Every day | ORAL | 1 refills | Status: DC

## 2016-04-07 MED ORDER — AMLODIPINE BESYLATE 5 MG PO TABS: 5.0000 mg | ORAL_TABLET | Freq: Every day | ORAL | 1 refills | Status: DC

## 2016-04-07 MED ORDER — CITALOPRAM HYDROBROMIDE 20 MG PO TABS: 20.0000 mg | ORAL_TABLET | Freq: Every day | ORAL | 1 refills | Status: DC

## 2016-04-07 NOTE — Telephone Encounter (Signed)
meds sent to pharm. Pt notified.  

## 2016-04-07 NOTE — Telephone Encounter (Signed)
Refill all meds six mo

## 2016-04-07 NOTE — Telephone Encounter (Signed)
Patient was seen on 04/05/2016 by Tracey Turner and wanted to have a refill on all her medications because she is almost out.  She said the pharmacy doesn't have anything.  She needs Tramadol, generic flexeril and citalopram to go to [place].  She said the rest of her medications will need to be sent to [facility].

## 2016-04-11 DIAGNOSIS — H209 Unspecified iridocyclitis: Secondary | ICD-10-CM | POA: Diagnosis not present

## 2016-04-25 DIAGNOSIS — Z6841 Body Mass Index (BMI) 40.0 and over, adult: Secondary | ICD-10-CM | POA: Diagnosis not present

## 2016-04-27 ENCOUNTER — Encounter: Payer: Self-pay | Admitting: Nurse Practitioner

## 2016-04-27 ENCOUNTER — Ambulatory Visit (INDEPENDENT_AMBULATORY_CARE_PROVIDER_SITE_OTHER): Payer: BLUE CROSS/BLUE SHIELD | Admitting: Nurse Practitioner

## 2016-04-27 VITALS — BP 128/74 | HR 66 | Ht 66.0 in | Wt 292.6 lb

## 2016-04-27 DIAGNOSIS — Z0000 Encounter for general adult medical examination without abnormal findings: Secondary | ICD-10-CM | POA: Diagnosis not present

## 2016-04-29 ENCOUNTER — Encounter: Payer: Self-pay | Admitting: Nurse Practitioner

## 2016-04-29 NOTE — Progress Notes (Signed)
Subjective:    Patient ID: Tracey Turner, female    DOB: 12/16/1955, 60 y.o.   MRN: [id number]  HPI presents for her wellness exam. Same sexual partner. No pelvic pain. No vaginal bleeding. Regular vision and dental exams. Regular activity; rides a bicycle 20 minutes 5-6 days per week. Plans colonoscopy with Dr. Kendell Bane.     Review of Systems  Constitutional: Negative for activity change, appetite change and fatigue.  HENT: Negative for dental problem, ear pain, sinus pressure and sore throat.   Respiratory: Negative for cough, chest tightness, shortness of breath and wheezing.   Cardiovascular: Negative for chest pain.  Gastrointestinal: Negative for abdominal distention, abdominal pain, blood in stool, constipation, diarrhea, nausea and vomiting.  Genitourinary: Negative for difficulty urinating, dysuria, enuresis, frequency, genital sores, pelvic pain, urgency, vaginal bleeding and vaginal discharge.       Objective:   Physical Exam  Constitutional: She is oriented to person, place, and time. She appears well-developed. No distress.  HENT:  Right Ear: External ear normal.  Left Ear: External ear normal.  Mouth/Throat: Oropharynx is clear and moist.  Neck: Normal range of motion. Neck supple. No tracheal deviation present. No thyromegaly present.  Cardiovascular: Normal rate, regular rhythm and normal heart sounds.  Exam reveals no gallop.   No murmur heard. Pulmonary/Chest: Effort normal and breath sounds normal.  Abdominal: Soft. She exhibits no distension. There is no tenderness.  Genitourinary: Vagina normal and uterus normal. No vaginal discharge found.  Genitourinary Comments: External GU: no rashes or lesions. Vagina: no discharge. No CMT. Bimanual exam: no tenderness or obvious masses; exam limited due to abd girth. Rectal exam: no masses; no stool for hemoccult.   Musculoskeletal: She exhibits no edema.  Lymphadenopathy:    She has no cervical adenopathy.  Neurological:  She is alert and oriented to person, place, and time.  Skin: Skin is warm and dry. No rash noted.  Psychiatric: She has a normal mood and affect. Her behavior is normal.  Vitals reviewed. Breast exam: no masses; axillae no adenopathy.        Assessment & Plan:  Routine general medical examination at a health care facility  Recommend daily vitamin D/calcium and weight loss. Return in about 4 months (around 08/25/2016) for diabetes check up.

## 2016-05-25 DIAGNOSIS — Z6841 Body Mass Index (BMI) 40.0 and over, adult: Secondary | ICD-10-CM | POA: Diagnosis not present

## 2016-07-07 ENCOUNTER — Ambulatory Visit: Payer: BLUE CROSS/BLUE SHIELD | Admitting: Nurse Practitioner

## 2016-07-20 DIAGNOSIS — Z6841 Body Mass Index (BMI) 40.0 and over, adult: Secondary | ICD-10-CM | POA: Diagnosis not present

## 2016-07-20 DIAGNOSIS — D509 Iron deficiency anemia, unspecified: Secondary | ICD-10-CM | POA: Diagnosis not present

## 2016-07-20 DIAGNOSIS — E559 Vitamin D deficiency, unspecified: Secondary | ICD-10-CM | POA: Diagnosis not present

## 2016-08-07 ENCOUNTER — Other Ambulatory Visit: Payer: Self-pay | Admitting: Family Medicine

## 2016-08-07 DIAGNOSIS — Z1231 Encounter for screening mammogram for malignant neoplasm of breast: Secondary | ICD-10-CM

## 2016-08-14 ENCOUNTER — Ambulatory Visit (INDEPENDENT_AMBULATORY_CARE_PROVIDER_SITE_OTHER): Payer: BLUE CROSS/BLUE SHIELD | Admitting: Nurse Practitioner

## 2016-08-14 ENCOUNTER — Encounter: Payer: Self-pay | Admitting: Nurse Practitioner

## 2016-08-14 VITALS — BP 128/82 | Ht 66.0 in | Wt 288.6 lb

## 2016-08-14 DIAGNOSIS — I10 Essential (primary) hypertension: Secondary | ICD-10-CM

## 2016-08-14 DIAGNOSIS — E119 Type 2 diabetes mellitus without complications: Secondary | ICD-10-CM

## 2016-08-14 DIAGNOSIS — D649 Anemia, unspecified: Secondary | ICD-10-CM

## 2016-08-14 LAB — POCT GLYCOSYLATED HEMOGLOBIN (HGB A1C): Hemoglobin A1C: 7.8

## 2016-08-14 LAB — POCT HEMOGLOBIN: HEMOGLOBIN: 14 g/dL (ref 12.2–16.2)

## 2016-08-14 MED ORDER — METOPROLOL TARTRATE 50 MG PO TABS: 50.0000 mg | ORAL_TABLET | Freq: Two times a day (BID) | ORAL | 1 refills | Status: DC

## 2016-08-14 MED ORDER — CITALOPRAM HYDROBROMIDE 20 MG PO TABS: 20.0000 mg | ORAL_TABLET | Freq: Every day | ORAL | 1 refills | Status: DC

## 2016-08-14 MED ORDER — GLIPIZIDE 5 MG PO TABS: 5.0000 mg | ORAL_TABLET | Freq: Two times a day (BID) | ORAL | 1 refills | Status: DC

## 2016-08-14 MED ORDER — LATANOPROST 0.005 % OP SOLN: 1.0000 [drp] | Freq: Every day | OPHTHALMIC | 5 refills | Status: DC

## 2016-08-14 MED ORDER — CYCLOBENZAPRINE HCL 10 MG PO TABS: 10.0000 mg | ORAL_TABLET | Freq: Three times a day (TID) | ORAL | 5 refills | Status: DC | PRN

## 2016-08-14 MED ORDER — AMLODIPINE BESYLATE 5 MG PO TABS: 5.0000 mg | ORAL_TABLET | Freq: Every day | ORAL | 1 refills | Status: DC

## 2016-08-14 MED ORDER — LANSOPRAZOLE 30 MG PO CPDR: DELAYED_RELEASE_CAPSULE | ORAL | 1 refills | Status: DC

## 2016-08-14 MED ORDER — METFORMIN HCL 1000 MG PO TABS: 1000.0000 mg | ORAL_TABLET | Freq: Two times a day (BID) | ORAL | 1 refills | Status: DC

## 2016-08-14 MED ORDER — TRIAMTERENE-HCTZ 37.5-25 MG PO CAPS: 1.0000 | ORAL_CAPSULE | Freq: Every day | ORAL | 1 refills | Status: DC

## 2016-08-14 MED ORDER — TRAMADOL HCL 50 MG PO TABS: 50.0000 mg | ORAL_TABLET | Freq: Two times a day (BID) | ORAL | 5 refills | Status: DC

## 2016-08-16 ENCOUNTER — Encounter: Payer: Self-pay | Admitting: Nurse Practitioner

## 2016-08-16 NOTE — Progress Notes (Signed)
Subjective:  Presents for routine follow-up on her diabetes. No chest pain/ischemic type pain or shortness of breath. Eating healthy. Trying to stay active. Gets regular follow-up with Dr. Darrick Penna for gastroenterology. Has a mammogram scheduled for next week. Fluid pill has been helping her swelling.  Objective:   BP 128/82   Ht 5\' 6"  (1.676 m)   Wt 288 lb 9.6 oz (130.9 kg)   BMI 46.58 kg/m  NAD. Alert, oriented. Lungs clear. Heart regular rate rhythm. Lower extremities trace pitting edema. Results for orders placed or performed in visit on 08/14/16  POCT glycosylated hemoglobin (Hb A1C)  Result Value Ref Range   Hemoglobin A1C 7.8   POCT hemoglobin  Result Value Ref Range   Hemoglobin 14 12.2 - 16.2 g/dL     Assessment:   Problem List Items Addressed This Visit      Cardiovascular and Mediastinum   HTN (hypertension)   Relevant Medications   amLODipine (NORVASC) 5 MG tablet   metoprolol (LOPRESSOR) 50 MG tablet   triamterene-hydrochlorothiazide (DYAZIDE) 37.5-25 MG capsule     Other   Anemia   Relevant Orders   POCT hemoglobin (Completed)    Other Visit Diagnoses    Diabetes mellitus without complication (HCC)    -  Primary   Relevant Medications   glipiZIDE (GLUCOTROL) 5 MG tablet   metFORMIN (GLUCOPHAGE) 1000 MG tablet   Other Relevant Orders   POCT glycosylated hemoglobin (Hb A1C) (Completed)       Plan:   Meds ordered this encounter  Medications  . cyclobenzaprine (FLEXERIL) 10 MG tablet    Sig: Take 1 tablet (10 mg total) by mouth 3 (three) times daily as needed. Muscle spasms    Dispense:  30 tablet    Refill:  5    Order Specific Question:   Supervising Provider    Answer:   03-25-1992 [[id number]]  . traMADol (ULTRAM) 50 MG tablet    Sig: Take 1 tablet (50 mg total) by mouth 2 (two) times daily. pain    Dispense:  60 tablet    Refill:  5    Order Specific Question:   Supervising Provider    Answer:   [name] [[id number]]  . amLODipine (NORVASC)  5 MG tablet    Sig: Take 1 tablet (5 mg total) by mouth daily.    Dispense:  90 tablet    Refill:  1    Order Specific Question:   Supervising Provider    Answer:   [name] [[id number]]  . citalopram (CELEXA) 20 MG tablet    Sig: Take 1 tablet (20 mg total) by mouth daily.    Dispense:  90 tablet    Refill:  1    Order Specific Question:   Supervising Provider    Answer:   [name] [[id number]]  . glipiZIDE (GLUCOTROL) 5 MG tablet    Sig: Take 1 tablet (5 mg total) by mouth 2 (two) times daily.    Dispense:  180 tablet    Refill:  1    Order Specific Question:   Supervising Provider    Answer:   [name] [[id number]]  . lansoprazole (PREVACID) 30 MG capsule    Sig: 1 po BID for acid reflux    Dispense:  180 capsule    Refill:  1    Pt had adverse reaction to PRILOSEC AND PROTONIX.    Order Specific Question:   Supervising Provider    Answer:  [name] [[id number]]  . latanoprost (XALATAN) 0.005 % ophthalmic solution    Sig: Place 1 drop into both eyes at bedtime.    Dispense:  7.5 mL    Refill:  5    Order Specific Question:   Supervising Provider    Answer:   [name] [[id number]]  . metFORMIN (GLUCOPHAGE) 1000 MG tablet    Sig: Take 1 tablet (1,000 mg total) by mouth 2 (two) times daily with a meal.    Dispense:  180 tablet    Refill:  1    Order Specific Question:   Supervising Provider    Answer:   [name] [[id number]]  . metoprolol (LOPRESSOR) 50 MG tablet    Sig: Take 1 tablet (50 mg total) by mouth 2 (two) times daily.    Dispense:  180 tablet    Refill:  1    Order Specific Question:   Supervising Provider    Answer:   [name] [[id number]]  . triamterene-hydrochlorothiazide (DYAZIDE) 37.5-25 MG capsule    Sig: Take 1 each (1 capsule total) by mouth daily.    Dispense:  90 capsule    Refill:  1    Order Specific Question:   Supervising Provider    Answer:   Riccardo Dubin   Continue follow-up with gastroenterologist.  Encouraged continued healthy diet activity and weight loss. Return in about 6 months (around 02/11/2017) for recheck.

## 2016-08-17 DIAGNOSIS — J309 Allergic rhinitis, unspecified: Secondary | ICD-10-CM | POA: Diagnosis not present

## 2016-08-17 DIAGNOSIS — Z6841 Body Mass Index (BMI) 40.0 and over, adult: Secondary | ICD-10-CM | POA: Diagnosis not present

## 2016-08-21 ENCOUNTER — Ambulatory Visit (HOSPITAL_COMMUNITY)
Admission: RE | Admit: 2016-08-21 | Discharge: 2016-08-21 | Disposition: A | Discharge status: Home / Self Care | Payer: BLUE CROSS/BLUE SHIELD | Source: Ambulatory Visit | Attending: Family Medicine | Admitting: Family Medicine

## 2016-08-21 DIAGNOSIS — Z1231 Encounter for screening mammogram for malignant neoplasm of breast: Secondary | ICD-10-CM | POA: Insufficient documentation

## 2016-08-22 DIAGNOSIS — H40053 Ocular hypertension, bilateral: Secondary | ICD-10-CM | POA: Diagnosis not present

## 2016-09-12 DIAGNOSIS — Z79899 Other long term (current) drug therapy: Secondary | ICD-10-CM | POA: Diagnosis not present

## 2016-09-12 DIAGNOSIS — E559 Vitamin D deficiency, unspecified: Secondary | ICD-10-CM | POA: Diagnosis not present

## 2016-09-12 DIAGNOSIS — E119 Type 2 diabetes mellitus without complications: Secondary | ICD-10-CM | POA: Diagnosis not present

## 2016-09-12 DIAGNOSIS — Z139 Encounter for screening, unspecified: Secondary | ICD-10-CM | POA: Diagnosis not present

## 2016-09-12 DIAGNOSIS — D509 Iron deficiency anemia, unspecified: Secondary | ICD-10-CM | POA: Diagnosis not present

## 2016-09-21 DIAGNOSIS — E559 Vitamin D deficiency, unspecified: Secondary | ICD-10-CM | POA: Diagnosis not present

## 2016-09-21 DIAGNOSIS — Z6841 Body Mass Index (BMI) 40.0 and over, adult: Secondary | ICD-10-CM | POA: Diagnosis not present

## 2016-09-21 DIAGNOSIS — D509 Iron deficiency anemia, unspecified: Secondary | ICD-10-CM | POA: Diagnosis not present

## 2016-09-21 DIAGNOSIS — Z008 Encounter for other general examination: Secondary | ICD-10-CM | POA: Diagnosis not present

## 2016-09-21 DIAGNOSIS — I10 Essential (primary) hypertension: Secondary | ICD-10-CM | POA: Diagnosis not present

## 2016-10-25 ENCOUNTER — Ambulatory Visit (INDEPENDENT_AMBULATORY_CARE_PROVIDER_SITE_OTHER): Payer: BLUE CROSS/BLUE SHIELD | Admitting: Family Medicine

## 2016-10-25 ENCOUNTER — Encounter: Payer: Self-pay | Admitting: Family Medicine

## 2016-10-25 DIAGNOSIS — M1710 Unilateral primary osteoarthritis, unspecified knee: Secondary | ICD-10-CM

## 2016-10-25 NOTE — Progress Notes (Signed)
Subjective:    Patient ID: Tracey Turner, female    DOB: 1955-07-15, 61 y.o.   MRN: [id number]  Knee Pain   The incident occurred more than 1 week ago. The pain is present in the right knee. The symptoms are aggravated by movement. Treatments tried: Tramodol, Flexeril    Patient states no other concerns this visit.   Right knee really bad pain  Hx of injetion in the knee  Took a flexeril helped a little  Knee injection  Some pain and some swelling, did a lot of walking and up and down step before  Pos knee arthritis hx, fam hx of gout   Patient had steroid knee injection the past and very much help. Having progressive pain and swelling                                        Pt was wondering about another shotrs Review of Systems No headache, no major weight loss or weight gain, no chest pain no back pain abdominal pain no change in bowel habits complete ROS otherwise negative     Objective:   Physical Exam    Alert vitals stable, NAD. Blood pressure good on repeat. HEENT normal. Lungs clear. Heart regular rate and rhythm.   Patient was prepped draped anesthetized and injected 1 mL Depo-Medrol 2 mL Xylocaine    Assessment & Plan:  Impression injection of chronic knee pain with acute exacerbation. Local measures discussed

## 2016-10-26 MED ORDER — METHYLPREDNISOLONE ACETATE 40 MG/ML IJ SUSP: 40.0000 mg | Freq: Once | INTRAMUSCULAR | Status: DC

## 2016-11-16 DIAGNOSIS — Z713 Dietary counseling and surveillance: Secondary | ICD-10-CM | POA: Diagnosis not present

## 2016-11-16 DIAGNOSIS — Z6841 Body Mass Index (BMI) 40.0 and over, adult: Secondary | ICD-10-CM | POA: Diagnosis not present

## 2016-11-28 DIAGNOSIS — J0100 Acute maxillary sinusitis, unspecified: Secondary | ICD-10-CM | POA: Diagnosis not present

## 2016-11-30 DIAGNOSIS — Z029 Encounter for administrative examinations, unspecified: Secondary | ICD-10-CM

## 2016-12-04 ENCOUNTER — Telehealth: Payer: Self-pay | Admitting: Family Medicine

## 2016-12-04 NOTE — Telephone Encounter (Signed)
MESSAGE FOR CAROLYN--patient brought by FML[employer] to be filled out. Please review,date and sign.In your box

## 2016-12-14 DIAGNOSIS — Z6841 Body Mass Index (BMI) 40.0 and over, adult: Secondary | ICD-10-CM | POA: Diagnosis not present

## 2016-12-14 DIAGNOSIS — E119 Type 2 diabetes mellitus without complications: Secondary | ICD-10-CM | POA: Diagnosis not present

## 2016-12-14 DIAGNOSIS — Z713 Dietary counseling and surveillance: Secondary | ICD-10-CM | POA: Diagnosis not present

## 2017-01-22 ENCOUNTER — Telehealth: Payer: Self-pay | Admitting: Gastroenterology

## 2017-01-22 NOTE — Telephone Encounter (Signed)
Forwarding to Dr. Darrick Penna for recommendations.

## 2017-01-22 NOTE — Telephone Encounter (Signed)
Pt called to make OV with [facility] only. I told her how far out [facility] was and offered her to be seen tomorrow with EG. Patient refused. Pt said that she is running out of her medication (generic prevacid) and her insurance will no longer cover it. She is wanting something else. She is aware of OV with [facility] at 330 on 9/26. Please advise and call either [phone] or [phone]

## 2017-01-23 MED ORDER — PANTOPRAZOLE SODIUM 40 MG PO TBEC: 40.0000 mg | DELAYED_RELEASE_TABLET | Freq: Every day | ORAL | 3 refills | Status: DC

## 2017-01-23 NOTE — Telephone Encounter (Signed)
REVIEWED-NO ADDITIONAL RECOMMENDATIONS. 

## 2017-01-23 NOTE — Addendum Note (Signed)
Addended by: West Bali on: 01/23/2017 03:14 PM   Modules accepted: Orders

## 2017-01-23 NOTE — Telephone Encounter (Addendum)
PT is aware but said they no longer have [facility] they use [facility].  I have sent the prescription to [facility].

## 2017-01-23 NOTE — Telephone Encounter (Signed)
PLEASE CALL PT. RX SENT FOR PANTOPRAZOLE TO [facility]. OPV SEP 2018.

## 2017-01-23 NOTE — Addendum Note (Signed)
Addended by: Lavena Bullion on: 01/23/2017 03:30 PM   Modules accepted: Orders

## 2017-02-12 ENCOUNTER — Telehealth: Payer: Self-pay | Admitting: Gastroenterology

## 2017-02-12 ENCOUNTER — Encounter: Payer: Self-pay | Admitting: Nurse Practitioner

## 2017-02-12 ENCOUNTER — Ambulatory Visit (INDEPENDENT_AMBULATORY_CARE_PROVIDER_SITE_OTHER): Payer: BLUE CROSS/BLUE SHIELD | Admitting: Nurse Practitioner

## 2017-02-12 VITALS — BP 110/72 | Ht 66.0 in | Wt 288.0 lb

## 2017-02-12 DIAGNOSIS — E1142 Type 2 diabetes mellitus with diabetic polyneuropathy: Secondary | ICD-10-CM | POA: Diagnosis not present

## 2017-02-12 DIAGNOSIS — E1169 Type 2 diabetes mellitus with other specified complication: Secondary | ICD-10-CM | POA: Diagnosis not present

## 2017-02-12 DIAGNOSIS — E119 Type 2 diabetes mellitus without complications: Secondary | ICD-10-CM | POA: Diagnosis not present

## 2017-02-12 DIAGNOSIS — Z87891 Personal history of nicotine dependence: Secondary | ICD-10-CM | POA: Diagnosis not present

## 2017-02-12 DIAGNOSIS — Z122 Encounter for screening for malignant neoplasm of respiratory organs: Secondary | ICD-10-CM

## 2017-02-12 LAB — POCT HEMOGLOBIN: Hemoglobin: 7.1 g/dL — AB (ref 12.2–16.2)

## 2017-02-12 MED ORDER — GABAPENTIN 100 MG PO CAPS: 100.0000 mg | ORAL_CAPSULE | Freq: Two times a day (BID) | ORAL | 2 refills | Status: DC

## 2017-02-12 NOTE — Telephone Encounter (Signed)
[phone]   Patient came in and stated that she can not take pantoprazole, it makes her sick and would like to see if [facility] can send in a prescription for prevacid. Patient has upcoming appt in September and has not been here for an office visit in 2 yrs .  Stated the prevacid will need a prior authorization for her insurance

## 2017-02-12 NOTE — Progress Notes (Addendum)
Subjective:  Presents for recheck on diabetes. Doing very well with her diet. Blood sugar this morning was 108. Continues follow-up with GI specialist. Could not take pantoprazole due to diarrhea and itching. Her insurance may pay for Prevacid which is the only medicine she can tolerate. She plans to contact her GI specialist today. No chest pain/ischemic type pain or shortness of breath. Mild edema in the lower legs at times. Has noticed some persistent pain in the toes on the right foot, especially the right great toe. 30+ pack year smoker; quit 2009. Wants screening for lung cancer; willing to undergo biopsy and treatment if needed. No signs or symptoms of lung cancer. No history of cancer.   Objective:   BP 110/72   Ht 5\' 6"  (1.676 m)   Wt 288 lb (130.6 kg)   BMI 46.48 kg/m  NAD. Alert, oriented. Lungs clear. Heart regular rate rhythm. Lower extremities 1+ pitting edema with multiple superficial varicosities noted. Diabetic Foot Exam - Simple   Simple Foot Form Diabetic Foot exam was performed with the following findings:  Yes 02/12/2017  9:00 AM  Visual Inspection No deformities, no ulcerations, no other skin breakdown bilaterally:  Yes Sensation Testing Intact to touch and monofilament testing bilaterally:  Yes Pulse Check See comments:  Yes Comments 1 + pitting edema in the ankles with multiple fine varicosities in the lower legs. DP pulses present bilat. Toes cool with normal cap refill. Monofilament testing: sensation present bilat but slightly diminished in toes on right as compared to left.       Assessment:   Type 2 diabetes mellitus with other specified complication, without long-term current use of insulin (HCC) - Plan: POCT hemoglobin, Microalbumin / creatinine urine ratio  Diabetic peripheral neuropathy (HCC)  Personal history of tobacco use, presenting hazards to health - Plan: CT CHEST LUNG CA SCREEN LOW DOSE W/O CM  Encounter for screening for malignant neoplasm of  respiratory organs - Plan: CT CHEST LUNG CA SCREEN LOW DOSE W/O CM     Plan:   Meds ordered this encounter  Medications  . gabapentin (NEURONTIN) 100 MG capsule    Sig: Take 1 capsule (100 mg total) by mouth 2 (two) times daily.    Dispense:  60 capsule    Refill:  2    Order Specific Question:   Supervising Provider    Answer:   02/14/2017 [[id number]]   Start low dose gabapentin to see if this will help with her foot pain. Patient to call back if no improvement. DC med and call if any problems. Patient gets her labs through work, given a note for HIV and hepatitis screening to be added to her next labs. Also a note for them to give her a Tdap vaccine. Patient to schedule physical for this fall.  Urine microalbumin/creatinine ratio pending. Follow-up with GI specialist regarding her reflux disease.

## 2017-02-13 ENCOUNTER — Encounter: Payer: Self-pay | Admitting: Family Medicine

## 2017-02-13 LAB — MICROALBUMIN / CREATININE URINE RATIO
CREATININE, UR: 167.8 mg/dL
MICROALB/CREAT RATIO: 14.4 mg/g{creat} (ref 0.0–30.0)
MICROALBUM., U, RANDOM: 24.1 ug/mL

## 2017-02-13 NOTE — Telephone Encounter (Signed)
Pt said she cannot take Pantoprazole and is requesting Prevacid. Would like it sent to [facility]. Her OV appt is 03/14/2017 .

## 2017-02-13 NOTE — Addendum Note (Signed)
Addended by: Campbell Riches on: 02/13/2017 07:54 AM   Modules accepted: Orders

## 2017-02-14 MED ORDER — LANSOPRAZOLE 30 MG PO CPDR: DELAYED_RELEASE_CAPSULE | ORAL | 5 refills | Status: DC

## 2017-02-14 NOTE — Addendum Note (Signed)
Addended by: West Bali on: 02/14/2017 05:48 PM   Modules accepted: Orders

## 2017-02-14 NOTE — Telephone Encounter (Signed)
PLEASE CALL PT. RX SENT FOR PREVACID BID.

## 2017-02-15 ENCOUNTER — Telehealth: Payer: Self-pay | Admitting: Gastroenterology

## 2017-02-15 NOTE — Telephone Encounter (Signed)
Pt wanted me to tell DS, "Thank you" and her medicine was coming today.

## 2017-02-15 NOTE — Telephone Encounter (Signed)
Noted  

## 2017-02-15 NOTE — Telephone Encounter (Signed)
LMOM that Rx has been sent in.  

## 2017-03-06 ENCOUNTER — Telehealth: Payer: Self-pay

## 2017-03-06 NOTE — Telephone Encounter (Signed)
Pt is calling because her insurance will not cover prevacid. She is wanting to know what to do? She said that everything is giving her diarrhea

## 2017-03-08 DIAGNOSIS — E559 Vitamin D deficiency, unspecified: Secondary | ICD-10-CM | POA: Diagnosis not present

## 2017-03-08 DIAGNOSIS — Z008 Encounter for other general examination: Secondary | ICD-10-CM | POA: Diagnosis not present

## 2017-03-08 DIAGNOSIS — Z719 Counseling, unspecified: Secondary | ICD-10-CM | POA: Diagnosis not present

## 2017-03-08 DIAGNOSIS — E119 Type 2 diabetes mellitus without complications: Secondary | ICD-10-CM | POA: Diagnosis not present

## 2017-03-08 DIAGNOSIS — D509 Iron deficiency anemia, unspecified: Secondary | ICD-10-CM | POA: Diagnosis not present

## 2017-03-08 NOTE — Telephone Encounter (Signed)
I have already done a PA for this and the insurance company denied it. It is not covered at all by her policy.

## 2017-03-08 NOTE — Telephone Encounter (Signed)
PLEASE CALL PT. WE WILL CONTACT HER INSURANCE COMPANY FOR PA.

## 2017-03-08 NOTE — Telephone Encounter (Signed)
PLEASE OBTAIN A LIST OF PRE-APPROVED PPIs ON HER PLAN. IF SH CANNOT TOLERATE ANY OF THEM SH HAS NO OTHER OPTION THAN TO PAYOUT OF POCKET FOR HER MEDS.

## 2017-03-09 NOTE — Telephone Encounter (Signed)
I checked in cover my meds and last year PA request was sent to [place] for lansoprazole bid, and it was finally able to get approval. This year it was sent to [place] as prevacid and it was denied. I have redone the PA for lansoprazole bid to see if it will be approved. Waiting for insurance response.

## 2017-03-14 ENCOUNTER — Ambulatory Visit (INDEPENDENT_AMBULATORY_CARE_PROVIDER_SITE_OTHER): Payer: BLUE CROSS/BLUE SHIELD | Admitting: Gastroenterology

## 2017-03-14 ENCOUNTER — Encounter: Payer: Self-pay | Admitting: Gastroenterology

## 2017-03-14 DIAGNOSIS — K529 Noninfective gastroenteritis and colitis, unspecified: Secondary | ICD-10-CM | POA: Diagnosis not present

## 2017-03-14 DIAGNOSIS — K219 Gastro-esophageal reflux disease without esophagitis: Secondary | ICD-10-CM

## 2017-03-14 MED ORDER — FAMOTIDINE 20 MG PO TABS: 20.0000 mg | ORAL_TABLET | Freq: Two times a day (BID) | ORAL | 11 refills | Status: DC

## 2017-03-14 NOTE — Assessment & Plan Note (Addendum)
EXACERBATING UNCONTROLLED GERD. WEIGH UP 25 LBS SINCE 2013.  CONTINUE WEIGHT LOSS EFFORTS.

## 2017-03-14 NOTE — Assessment & Plan Note (Signed)
Etiology unclear. SYMPTOMS NOT CONTROLLED. DIFFERENTIAL DIAGNOSIS INCLUDES:  MICROSCOPIC COLITIS due to PPI, OR MED SIDE EFFECT, LESS LIKELY CELIAC SPRUE, GIARDIASIS, C DIFF COLITIS, OR IBD.  STOP PREVACID. START PEPCID 20 MG BID 30 MINS PRIOR TO MEALS MON-FRI. PEPTO BISMOL 3 PILLS OR DOSES TID. HOLD FOR CONSTIPATION. SUBMIT STOOL STUDIES: GIARDIA Ag, FECAL LACTOFERRIN, C DIFF PCR. CALL IN 3 WEEKS IF YOUR DIARRHEA IS NOT BETTER. FOLLOW UP IN 3 MOS.

## 2017-03-14 NOTE — Assessment & Plan Note (Signed)
SYMPTOMS NOT CONTROLLED OFF PPT BUT CANNOT TOLERTAE DUE TO DIARRHEA.  ADD PEPCID M-FRI. AVOID TRIGGERS. FOLLOW UP IN 3 MOS.

## 2017-03-14 NOTE — Progress Notes (Signed)
Subjective:    Patient ID: Tracey Turner, female    DOB: 02-Jul-1955, 61 y.o.   MRN: [id number]  [name], MD   HPI WAS TAKING PREVACID AND TOLERATING BUT UNABLE TO TOLERATE FOR PAST LAST HURT. WATERY STOOLS/DAY: 6 OR 7 TIMES. WENT WITHOUT PREVACID FOR 1 MO AT LEAST. STOPPED AND THEN RE-STARTED IT AND STARTED HAVING LOOSE STOOLS.  HAVEN'T TRIED PEPCID OR ZANTAC. NO TRAVEL OUTSIDE. ABX: > 6 MOS. WELL WATER: BOTTLED WATER BUT WELL WATER AT HOME. HAVING HEARTBURN AND MAKES HER TIRED QUICK. PEPTO BISMOL WORKS.    PT DENIES FEVER, CHILLS, HEMATOCHEZIA, nausea, vomiting, melena, diarrhea, CHEST PAIN, SHORTNESS OF BREATH, CHANGE IN BOWEL IN HABITS, constipation, abdominal pain, problems swallowing, OR problems with sedation.  Past Medical History:  Diagnosis Date  . Anemia   . Arthritis   . Back pain   . Diabetes mellitus   . Gastritis   . Glaucoma    questionable, getting second opinion  . Hypertension   . Knee pain    Past Surgical History:  Procedure Laterality Date  . COLONOSCOPY  02/13/2011   internal hemorrhoids  . ESOPHAGOGASTRODUODENOSCOPY  02/13/2011   mild gatritis  . GIVENS CAPSULE STUDY  03/28/2012      . GIVENS CAPSULE STUDY  03/26/2012   Procedure: GIVENS CAPSULE STUDY;  Surgeon: West Bali, MD;  Location: [facility];  Service: Endoscopy;  Laterality: N/A;  7:30  . None     Allergies  Allergen Reactions  . Ace Inhibitors Shortness Of Breath and Swelling  . Protonix [Pantoprazole] Diarrhea and Itching    Diarrhea; can take Prevacid without difficulty  . Amoxicillin     Nausea/aggravates gastritis  . Nexium [Esomeprazole Magnesium] Diarrhea  . Nsaids Other (See Comments)    Gastritis, sharp decrease in iron  . Prilosec [Omeprazole]     diarrhea  . Victoza [Liraglutide] Other (See Comments)    "out of it" while taking med   Current Outpatient Prescriptions  Medication Sig Dispense Refill  . acetaminophen (TYLENOL) 650 MG CR tablet Take 650 mg by  mouth every 8 (eight) hours as needed. arthritis    . amLODipine (NORVASC) 5 MG tablet Take 1 tablet (5 mg total) by mouth daily.    . Calcium Carbonate-Vitamin D (CALCIUM + D PO) Take by mouth daily.     . cetirizine (ZYRTEC) 10 MG tablet Take 10 mg by mouth daily.    . citalopram (CELEXA) 20 MG tablet Take 1 tablet (20 mg total) by mouth daily. (Patient taking differently: Take 20 mg by mouth as needed. )    . cyclobenzaprine (FLEXERIL) 10 MG tablet Take 1 tablet (10 mg total) by mouth 3 (three) times daily as needed. Muscle spasms    . ferrous sulfate dried (SLOW FE) 160 (50 FE) MG TBCR Take 160 mg by mouth 3 (three) times daily with meals.     . fluticasone (FLONASE) 50 MCG/ACT nasal spray Place 2 sprays into both nostrils daily.    Marland Kitchen gabapentin (NEURONTIN) 100 MG capsule Take 1 capsule (100 mg total) by mouth 2 (two) times daily.    Marland Kitchen glipiZIDE (GLUCOTROL) 5 MG tablet Take 1 tablet (5 mg total) by mouth 2 (two) times daily.    Marland Kitchen glucose blood (ONE TOUCH ULTRA TEST) test strip Use as instructed BID prn    .      . latanoprost (XALATAN) 0.005 % ophthalmic solution Place 1 drop into both eyes at bedtime.    Marland Kitchen  metFORMIN (GLUCOPHAGE) 1000 MG tablet Take 1 tablet (1,000 mg total) by mouth 2 (two) times daily with a meal.    . metoprolol (LOPRESSOR) 50 MG tablet Take 1 tablet (50 mg total) by mouth 2 (two) times daily.    . Multiple Vitamin (MULTIVITAMIN WITH MINERALS) TABS Take 1 tablet by mouth daily.    . traMADol (ULTRAM) 50 MG tablet Take 1 tablet (50 mg total) by mouth 2 (two) times daily. pain    . triamterene-hydrochlorothiazide (DYAZIDE) 37.5-25 MG capsule Take 1 each (1 capsule total) by mouth daily.    Marland Kitchen nystatin (MYCOSTATIN) 100000 UNIT/ML suspension Take 5 mLs by mouth 4 (four) times daily.     Review of Systems PER HPI OTHERWISE ALL SYSTEMS ARE NEGATIVE.    Objective:   Physical Exam  Constitutional: She is oriented to person, place, and time. She appears well-developed and  well-nourished. No distress.  HENT:  Head: Normocephalic and atraumatic.  Mouth/Throat: Oropharynx is clear and moist. No oropharyngeal exudate.  TOP PLATES, TEETH oN BOTTOM  Eyes: Pupils are equal, round, and reactive to light. No scleral icterus.  Neck: Normal range of motion. Neck supple.  Cardiovascular: Normal rate, regular rhythm and normal heart sounds.   Pulmonary/Chest: Effort normal and breath sounds normal. No respiratory distress.  Abdominal: Soft. Bowel sounds are normal. She exhibits no distension. There is no tenderness.  Musculoskeletal: She exhibits edema.  TRACE BILATERAL LOWER EXTREMITIES  Lymphadenopathy:    She has no cervical adenopathy.  Neurological: She is alert and oriented to person, place, and time.  NO  NEW FOCAL DEFICITS  Psychiatric:  FLAT AFFECT, NL MOOD  Vitals reviewed.     Assessment & Plan:

## 2017-03-14 NOTE — Patient Instructions (Signed)
STOP PREVACID. START PEPCID 20 MG BID 30 MINS PRIOR TO MEALS MON-FRI.  AVOID REFLUX TRIGGERS. SEE INFO BELOW.  TO CONTROL DIARRHEA, PEPTO BISMOL 3 PILLS OR DOSES THREE TIMES A DAY. HOLD FOR CONSTIPATION.  SUBMIT STOOL STUDIES: GIARDIA Ag, FECAL LACTOFERRIN, C DIFF PCR.  PLEASE CALL IN 3 WEEKS IF YOUR DIARRHEA IS NOT BETTER.  FOLLOW UP IN 3 MOS.     Lifestyle and home remedies TO MANAGE HEARTBURN  You may eliminate or reduce the frequency of heartburn by making the following lifestyle changes:  . Control your weight. Being overweight is a major risk factor for heartburn and GERD. Excess pounds put pressure on your abdomen, pushing up your stomach and causing acid to back up into your esophagus.   . Eat smaller meals. 4 TO 6 MEALS A DAY. This reduces pressure on the lower esophageal sphincter, helping to prevent the valve from opening and acid from washing back into your esophagus.   Allena Earing your belt. Clothes that fit tightly around your waist put pressure on your abdomen and the lower esophageal sphincter.   . Eliminate heartburn triggers. Everyone has specific triggers. Common triggers such as fatty or fried foods, spicy food, tomato sauce, carbonated beverages, alcohol, chocolate, mint, garlic, onion, caffeine and nicotine may make heartburn worse.   Marland Kitchen Avoid stooping or bending. Tying your shoes is OK. Bending over for longer periods to weed your garden isn't, especially soon after eating.   . Don't lie down after a meal. Wait at least three to four hours after eating before going to bed, and don't lie down right after eating.   Marland Kitchen PUT THE HEAD OF YOUR BED ON 6 INCH BLOCKS.   Alternative medicine . Several home remedies exist for treating GERD, but they provide only temporary relief. They include drinking baking soda (sodium bicarbonate) added to water or drinking other fluids such as baking soda mixed with cream of tartar and water.  . Although these liquids create temporary  relief by neutralizing, washing away or buffering acids, eventually they aggravate the situation by adding gas and fluid to your stomach, increasing pressure and causing more acid reflux. Further, adding more sodium to your diet may increase your blood pressure and add stress to your heart, and excessive bicarbonate ingestion can alter the acid-base balance in your body.

## 2017-03-14 NOTE — Telephone Encounter (Signed)
I was finally able to get lansoprazole approved with the patients insurance. She gets this by mail order.  I have sent her a message in [place].

## 2017-03-15 NOTE — Progress Notes (Signed)
cc'ed to pcp °

## 2017-03-16 ENCOUNTER — Other Ambulatory Visit: Payer: Self-pay | Admitting: Nurse Practitioner

## 2017-03-19 NOTE — Progress Notes (Signed)
On recall  °

## 2017-03-27 DIAGNOSIS — E559 Vitamin D deficiency, unspecified: Secondary | ICD-10-CM | POA: Diagnosis not present

## 2017-03-27 DIAGNOSIS — Z79899 Other long term (current) drug therapy: Secondary | ICD-10-CM | POA: Diagnosis not present

## 2017-03-27 DIAGNOSIS — E119 Type 2 diabetes mellitus without complications: Secondary | ICD-10-CM | POA: Diagnosis not present

## 2017-03-27 DIAGNOSIS — J069 Acute upper respiratory infection, unspecified: Secondary | ICD-10-CM | POA: Diagnosis not present

## 2017-03-27 DIAGNOSIS — D509 Iron deficiency anemia, unspecified: Secondary | ICD-10-CM | POA: Diagnosis not present

## 2017-04-03 ENCOUNTER — Telehealth: Payer: Self-pay | Admitting: Family Medicine

## 2017-04-03 DIAGNOSIS — Z6841 Body Mass Index (BMI) 40.0 and over, adult: Secondary | ICD-10-CM | POA: Diagnosis not present

## 2017-04-03 DIAGNOSIS — E559 Vitamin D deficiency, unspecified: Secondary | ICD-10-CM | POA: Diagnosis not present

## 2017-04-03 DIAGNOSIS — Z719 Counseling, unspecified: Secondary | ICD-10-CM | POA: Diagnosis not present

## 2017-04-03 DIAGNOSIS — E119 Type 2 diabetes mellitus without complications: Secondary | ICD-10-CM | POA: Diagnosis not present

## 2017-04-03 DIAGNOSIS — D509 Iron deficiency anemia, unspecified: Secondary | ICD-10-CM | POA: Diagnosis not present

## 2017-04-03 DIAGNOSIS — Z008 Encounter for other general examination: Secondary | ICD-10-CM | POA: Diagnosis not present

## 2017-04-03 NOTE — Telephone Encounter (Signed)
Review lab results in results folder. °

## 2017-04-18 DIAGNOSIS — H40053 Ocular hypertension, bilateral: Secondary | ICD-10-CM | POA: Diagnosis not present

## 2017-04-30 ENCOUNTER — Ambulatory Visit (INDEPENDENT_AMBULATORY_CARE_PROVIDER_SITE_OTHER): Payer: BLUE CROSS/BLUE SHIELD | Admitting: Nurse Practitioner

## 2017-04-30 ENCOUNTER — Encounter: Payer: Self-pay | Admitting: Nurse Practitioner

## 2017-04-30 ENCOUNTER — Telehealth: Payer: Self-pay | Admitting: Nurse Practitioner

## 2017-04-30 VITALS — BP 136/74 | Ht 65.0 in | Wt 293.0 lb

## 2017-04-30 DIAGNOSIS — Z124 Encounter for screening for malignant neoplasm of cervix: Secondary | ICD-10-CM

## 2017-04-30 DIAGNOSIS — Z0000 Encounter for general adult medical examination without abnormal findings: Secondary | ICD-10-CM

## 2017-04-30 DIAGNOSIS — Z1151 Encounter for screening for human papillomavirus (HPV): Secondary | ICD-10-CM | POA: Diagnosis not present

## 2017-04-30 DIAGNOSIS — R5383 Other fatigue: Secondary | ICD-10-CM

## 2017-04-30 NOTE — Telephone Encounter (Signed)
Patient wants to know if she can take Prednisone for her back.  If so, please call in to [facility] and call patient when complete.

## 2017-05-01 ENCOUNTER — Encounter: Payer: Self-pay | Admitting: Nurse Practitioner

## 2017-05-01 ENCOUNTER — Encounter: Payer: Self-pay | Admitting: Gastroenterology

## 2017-05-01 DIAGNOSIS — Z6841 Body Mass Index (BMI) 40.0 and over, adult: Secondary | ICD-10-CM | POA: Diagnosis not present

## 2017-05-01 DIAGNOSIS — Z7689 Persons encountering health services in other specified circumstances: Secondary | ICD-10-CM | POA: Diagnosis not present

## 2017-05-01 DIAGNOSIS — E119 Type 2 diabetes mellitus without complications: Secondary | ICD-10-CM | POA: Diagnosis not present

## 2017-05-01 NOTE — Progress Notes (Addendum)
Subjective:    Patient ID: Tracey Turner, female    DOB: 1956-02-01, 61 y.o.   MRN: [id number]  HPI presents for her wellness exam.  Overall healthy diet.  Regular eye exams, needs dental exam.  No vaginal bleeding or pelvic pain.  No new sexual partners.  No regular activity.  No shortness of breath chest pain or ischemic type pain.  Has noticed feeling more tired with activity.  Has a strong family history of heart disease.  Has not had a cardiac workup at this point.  Sees Dr. Darrick Penna for her GI issues.  Contacted pulmonology but did not get her low-dose CT scan for lung cancer screening.    Review of Systems  Constitutional: Positive for fatigue. Negative for activity change and appetite change.  HENT: Negative for dental problem, ear pain, sinus pressure and sore throat.   Respiratory: Negative for cough, chest tightness, shortness of breath and wheezing.   Cardiovascular: Positive for leg swelling. Negative for chest pain.       Edema much better with her fluid pill.  Gastrointestinal: Negative for abdominal distention, abdominal pain, constipation, diarrhea, nausea and vomiting.  Genitourinary: Negative for difficulty urinating, dysuria, enuresis, frequency, genital sores, pelvic pain, urgency, vaginal bleeding and vaginal discharge.   Depression screen [facility] 2/9 04/30/2017 04/27/2016  Decreased Interest 0 0  Down, Depressed, Hopeless 0 0  PHQ - 2 Score 0 0       Objective:   Physical Exam  Constitutional: She is oriented to person, place, and time. She appears well-developed. No distress.  HENT:  Right Ear: External ear normal.  Left Ear: External ear normal.  Mouth/Throat: Oropharynx is clear and moist.  Neck: Normal range of motion. Neck supple. No tracheal deviation present. No thyromegaly present.  Cardiovascular: Normal rate, regular rhythm and normal heart sounds. Exam reveals no gallop.  No murmur heard. Pulmonary/Chest: Effort normal and breath sounds normal. Right  breast exhibits no inverted nipple, no mass, no skin change and no tenderness. Left breast exhibits no inverted nipple, no mass, no skin change and no tenderness. Breasts are symmetrical.  Axilla no adenopathy.  Abdominal: Soft. She exhibits no distension. There is no tenderness.  Genitourinary: Vagina normal and uterus normal. No vaginal discharge found.  Genitourinary Comments: External GU no rashes or lesions.  Vagina no discharge.  No CMT.  Bimanual exam no tenderness or obvious masses but very limited due to significant abdominal girth.  Musculoskeletal: She exhibits no edema.  Lymphadenopathy:    She has no cervical adenopathy.  Neurological: She is alert and oriented to person, place, and time.  Skin: Skin is warm and dry. No rash noted.  Psychiatric: She has a normal mood and affect. Her behavior is normal.  Vitals reviewed.  See scanned labs from work.  Hemoglobin A1c 7.8 on 03/27/2017.       Assessment & Plan:   Problem List Items Addressed This Visit    None    Visit Diagnoses    Routine general medical examination at a health care facility    -  Primary   Relevant Orders   Pap IG and HPV (high risk) DNA detection   Screening for cervical cancer       Relevant Orders   Pap IG and HPV (high risk) DNA detection   Screening for HPV (human papillomavirus)       Relevant Orders   Pap IG and HPV (high risk) DNA detection   Fatigue, unspecified type  Relevant Orders   Ambulatory referral to Cardiology     Will refer to cardiology for evaluation especially considering multiple risk factors for heart disease.  Patient is having vague fatigue with activity which may be related to an inactive lifestyle.  Warning signs reviewed.  Patient to go to ED in the meantime if any problems.  Encouraged activity as tolerated, discussed some options due to her limited mobility.  Also encouraged daily vitamin D/calcium supplementation. Return in about 4 months (around 08/28/2017) for  recheck.

## 2017-05-02 ENCOUNTER — Other Ambulatory Visit: Payer: Self-pay | Admitting: Nurse Practitioner

## 2017-05-02 MED ORDER — PREDNISONE 20 MG PO TABS
ORAL_TABLET | ORAL | 0 refills | Status: DC
Start: 2017-05-02 — End: 2017-12-26

## 2017-05-02 NOTE — Telephone Encounter (Signed)
Patient called to check on this

## 2017-05-02 NOTE — Telephone Encounter (Signed)
Med sent in. Watch sugars. Office visit if no better.

## 2017-05-02 NOTE — Telephone Encounter (Signed)
Left message to return call 

## 2017-05-02 NOTE — Telephone Encounter (Signed)
Patient states she takes tramadol and flexeril but it knock her out at work. Patient states she has done the prednisone taper before and is just careful about what she eats.

## 2017-05-02 NOTE — Telephone Encounter (Signed)
We can try but she has to understand it will cause her sugars to go very high. What else is she taking for her back pain?

## 2017-05-03 LAB — PAP IG AND HPV HIGH-RISK
HPV, HIGH-RISK: NEGATIVE
PAP SMEAR COMMENT: 0

## 2017-05-03 NOTE — Telephone Encounter (Signed)
Discussed with pt. Pt verbalized understanding.  °

## 2017-05-04 ENCOUNTER — Encounter: Payer: Self-pay | Admitting: Family Medicine

## 2017-05-22 DIAGNOSIS — Z7689 Persons encountering health services in other specified circumstances: Secondary | ICD-10-CM | POA: Diagnosis not present

## 2017-05-22 DIAGNOSIS — Z6841 Body Mass Index (BMI) 40.0 and over, adult: Secondary | ICD-10-CM | POA: Diagnosis not present

## 2017-05-23 ENCOUNTER — Ambulatory Visit: Payer: BLUE CROSS/BLUE SHIELD | Admitting: Cardiology

## 2017-05-23 ENCOUNTER — Encounter: Payer: Self-pay | Admitting: *Deleted

## 2017-05-23 VITALS — BP 148/80 | HR 79 | Ht 65.0 in | Wt 294.6 lb

## 2017-05-23 DIAGNOSIS — F17201 Nicotine dependence, unspecified, in remission: Secondary | ICD-10-CM

## 2017-05-23 DIAGNOSIS — E119 Type 2 diabetes mellitus without complications: Secondary | ICD-10-CM | POA: Diagnosis not present

## 2017-05-23 DIAGNOSIS — R5383 Other fatigue: Secondary | ICD-10-CM | POA: Diagnosis not present

## 2017-05-23 DIAGNOSIS — I10 Essential (primary) hypertension: Secondary | ICD-10-CM | POA: Diagnosis not present

## 2017-05-23 DIAGNOSIS — R0602 Shortness of breath: Secondary | ICD-10-CM | POA: Diagnosis not present

## 2017-05-23 NOTE — Progress Notes (Signed)
Cardiology Office Note  Date: 05/23/2017   ID: Tracey Turner, DOB 19-May-1956, MRN [id number]  PCP: [name], MD  Consulting cardiologist: Nona Dell, MD   Chief Complaint  Patient presents with  . Exertional fatigue     History of Present Illness: Tracey Turner is a 61 y.o. female referred for cardiology consultation by [name] for evaluation of exertional fatigue.  She states that over the last several months she has been experiencing exertional fatigue and mild shortness of breath with activity.  She does not report exertional chest pain, palpitations, or syncope.  She works at [employer], states that she is on her feet most of the day.  Has mild leg swelling that is dependent.  This is gone by the morning when she has had her feet up overnight.  She does have a significant family history of CAD, not aware of any personal history although she has not undergone any previous cardiac structural or ischemic testing.  I personally reviewed her ECG today which shows sinus rhythm with LVH.  Current medications are outlined below.  She reports compliance, states that systolics usually run in the 130s.  She is on beta-blocker, Norvasc, and diuretic therapy.  Past Medical History:  Diagnosis Date  . Anemia   . Arthritis   . Back pain   . Gastritis   . Glaucoma   . Hypertension   . Knee pain   . Type 2 diabetes mellitus (HCC)     Past Surgical History:  Procedure Laterality Date  . COLONOSCOPY  02/13/2011   internal hemorrhoids  . ESOPHAGOGASTRODUODENOSCOPY  02/13/2011   mild gatritis  . GIVENS CAPSULE STUDY  03/28/2012      . GIVENS CAPSULE STUDY  03/26/2012   Procedure: GIVENS CAPSULE STUDY;  Surgeon: West Bali, MD;  Location: [facility];  Service: Endoscopy;  Laterality: N/A;  7:30  . None      Current Outpatient Medications  Medication Sig Dispense Refill  . acetaminophen (TYLENOL) 650 MG CR tablet Take 650 mg by mouth every 8 (eight) hours as  needed. arthritis    . amLODipine (NORVASC) 5 MG tablet TAKE 1 TABLET BY MOUTH  DAILY 90 tablet 1  . Calcium Carbonate-Vitamin D (CALCIUM + D PO) Take by mouth daily.     . cetirizine (ZYRTEC) 10 MG tablet Take 10 mg by mouth daily.    . citalopram (CELEXA) 20 MG tablet TAKE 1 TABLET BY MOUTH  DAILY 90 tablet 1  . cyclobenzaprine (FLEXERIL) 10 MG tablet Take 1 tablet (10 mg total) by mouth 3 (three) times daily as needed. Muscle spasms 30 tablet 5  . famotidine (PEPCID) 20 MG tablet Take 1 tablet (20 mg total) by mouth 2 (two) times daily. 60 tablet 11  . ferrous sulfate dried (SLOW FE) 160 (50 FE) MG TBCR Take 160 mg by mouth 3 (three) times daily with meals.     . fluticasone (FLONASE) 50 MCG/ACT nasal spray Place 2 sprays into both nostrils daily. 48 g 5  . gabapentin (NEURONTIN) 100 MG capsule Take 1 capsule (100 mg total) by mouth 2 (two) times daily. 60 capsule 2  . glipiZIDE (GLUCOTROL) 5 MG tablet TAKE 1 TABLET BY MOUTH TWO  TIMES DAILY 180 tablet 1  . glucose blood (ONE TOUCH ULTRA TEST) test strip Use as instructed BID prn 100 each 11  . latanoprost (XALATAN) 0.005 % ophthalmic solution Place 1 drop into both eyes at bedtime. 7.5 mL  5  . metFORMIN (GLUCOPHAGE) 1000 MG tablet TAKE 1 TABLET BY MOUTH TWO  TIMES DAILY WITH A MEAL 180 tablet 1  . metoprolol tartrate (LOPRESSOR) 50 MG tablet TAKE 1 TABLET BY MOUTH TWO  TIMES DAILY 180 tablet 1  . Multiple Vitamin (MULTIVITAMIN WITH MINERALS) TABS Take 1 tablet by mouth daily.    Marland Kitchen nystatin (MYCOSTATIN) 100000 UNIT/ML suspension Take 5 mLs by mouth 4 (four) times daily.    . predniSONE (DELTASONE) 20 MG tablet Take 3 po qd x 2 d then 2 po qd x 2d then one po qd x 2 d 12 tablet 0  . traMADol (ULTRAM) 50 MG tablet Take 1 tablet (50 mg total) by mouth 2 (two) times daily. pain 60 tablet 5  . triamterene-hydrochlorothiazide (DYAZIDE) 37.5-25 MG capsule TAKE 1 CAPSULE BY MOUTH  EACH DAILY 90 capsule 1   Current Facility-Administered Medications    Medication Dose Route Frequency Provider Last Rate Last Dose  . methylPREDNISolone acetate (DEPO-MEDROL) injection 40 mg  40 mg Intra-articular Once [name], MD       Allergies:  Ace inhibitors; Protonix [pantoprazole]; Amoxicillin; Nexium [esomeprazole magnesium]; Nsaids; Prevacid [lansoprazole]; Prilosec [omeprazole]; and Victoza [liraglutide]   Social History: The patient  reports that she quit smoking about 9 years ago. Her smoking use included cigarettes. She started smoking about 46 years ago. She has a 30.00 pack-year smoking history. she has never used smokeless tobacco. She reports that she does not drink alcohol or use drugs.   ROS:  Please see the history of present illness. Otherwise, complete review of systems is positive for none.  All other systems are reviewed and negative.   Physical Exam: VS:  BP (!) 148/80 (BP Location: Right Arm, Cuff Size: Large)   Pulse 79   Ht 5\' 5"  (1.651 m)   Wt 294 lb 9.6 oz (133.6 kg)   SpO2 96%   BMI 49.02 kg/m , BMI Body mass index is 49.02 kg/m.  Wt Readings from Last 3 Encounters:  05/23/17 294 lb 9.6 oz (133.6 kg)  04/30/17 293 lb (132.9 kg)  03/14/17 293 lb 3.2 oz (133 kg)    General: Morbidly obese woman, appears comfortable at rest. HEENT: Conjunctiva and lids normal, oropharynx clear. Neck: Supple, no elevated JVP or carotid bruits, no thyromegaly. Lungs: Clear to auscultation, nonlabored breathing at rest. Cardiac: Regular rate and rhythm, no S3 or significant systolic murmur, no pericardial rub. Abdomen: Obese, nontender, bowel sounds present. Extremities: Mild ankle edema, distal pulses 2+. Skin: Warm and dry. Musculoskeletal: No kyphosis. Neuropsychiatric: Alert and oriented x3, affect grossly appropriate.  ECG: There is no old tracing available for review.  Recent Labwork: 02/12/2017: Hemoglobin 7.1   Assessment and Plan:  1.  Exertional fatigue and shortness of breath.  Patient is morbidly obese and has  history of diabetes mellitus and hypertension.  Also family history of CAD.  ECG shows LVH.  She has not undergone any prior cardiac structural or ischemic testing.  We will arrange a 2-day protocol Lexiscan Myoview as well as echocardiogram.  2.  Essential hypertension, on Norvasc, Lopressor, and Dyazide.  Keep follow-up with Dr. Gerda Diss.  3.  Type 2 diabetes mellitus, last hemoglobin A1c 7.1.  He is on Glucotrol and Glucophage.  4.  Tobacco abuse in remission.  She smoked remotely.  Current medicines were reviewed with the patient today.   Orders Placed This Encounter  Procedures  . NM Myocar Multi W/Spect W/Wall Motion / EF  . EKG  12-Lead  . ECHOCARDIOGRAM COMPLETE    Disposition: Call with test results.  Signed, Jonelle Sidle, MD, [facility] 05/23/2017 3:22 PM    [facility] [address] [place], [place] [address] Phone: [phone]; Fax: [phone]

## 2017-05-23 NOTE — Patient Instructions (Signed)
Medication Instructions:  Your physician recommends that you continue on your current medications as directed. Please refer to the Current Medication list given to you today.   Labwork: none  Testing/Procedures: Your physician has requested that you have a lexiscan myoview. For further information please visit [email or web address] Please follow instruction sheet, as given.  Your physician has requested that you have an echocardiogram. Echocardiography is a painless test that uses sound waves to create images of your heart. It provides your doctor with information about the size and shape of your heart and how well your heart's chambers and valves are working. This procedure takes approximately one hour. There are no restrictions for this procedure.    Follow-Up: Your physician recommends that you schedule a follow-up appointment in: to be determined based on test results- we will call with results    Any Other Special Instructions Will Be Listed Below (If Applicable).     If you need a refill on your cardiac medications before your next appointment, please call your pharmacy.

## 2017-05-31 ENCOUNTER — Other Ambulatory Visit: Payer: Self-pay | Admitting: Nurse Practitioner

## 2017-05-31 DIAGNOSIS — Z7689 Persons encountering health services in other specified circumstances: Secondary | ICD-10-CM | POA: Diagnosis not present

## 2017-05-31 DIAGNOSIS — Z6841 Body Mass Index (BMI) 40.0 and over, adult: Secondary | ICD-10-CM | POA: Diagnosis not present

## 2017-06-13 ENCOUNTER — Other Ambulatory Visit: Payer: Self-pay

## 2017-06-13 MED ORDER — FLUTICASONE PROPIONATE 50 MCG/ACT NA SUSP: 2.0000 | Freq: Every day | NASAL | 5 refills | Status: DC

## 2017-06-14 ENCOUNTER — Ambulatory Visit (HOSPITAL_COMMUNITY)
Admission: RE | Admit: 2017-06-14 | Discharge: 2017-06-14 | Disposition: A | Discharge status: Home / Self Care | Payer: BLUE CROSS/BLUE SHIELD | Source: Ambulatory Visit | Attending: Cardiology | Admitting: Cardiology

## 2017-06-14 ENCOUNTER — Encounter (HOSPITAL_BASED_OUTPATIENT_CLINIC_OR_DEPARTMENT_OTHER)
Admission: RE | Admit: 2017-06-14 | Discharge: 2017-06-14 | Disposition: A | Discharge status: Home / Self Care | Payer: BLUE CROSS/BLUE SHIELD | Source: Ambulatory Visit | Attending: Cardiology | Admitting: Cardiology

## 2017-06-14 DIAGNOSIS — R0602 Shortness of breath: Secondary | ICD-10-CM | POA: Insufficient documentation

## 2017-06-14 LAB — ECHOCARDIOGRAM COMPLETE
CHL CUP DOP CALC LVOT VTI: 36.5 cm
E decel time: 232 msec
EERAT: 11.11
FS: 49 % — AB (ref 28–44)
IVS/LV PW RATIO, ED: 1.12
LA ID, A-P, ES: 37 mm
LA diam index: 1.45 cm/m2
LAVOL: 63.6 mL
LAVOLA4C: 62.3 mL
LAVOLIN: 24.9 mL/m2
LEFT ATRIUM END SYS DIAM: 37 mm
LV PW d: 9.84 mm — AB (ref 0.6–1.1)
LV dias vol: 94 mL (ref 46–106)
LV e' LATERAL: 9.36 cm/s
LV sys vol index: 12 mL/m2
LVDIAVOLIN: 37 mL/m2
LVEEAVG: 11.11
LVEEMED: 11.11
LVOT area: 2.84 cm2
LVOT peak grad rest: 8 mmHg
LVOTD: 19 mm
LVOTPV: 138 cm/s
LVOTSV: 104 mL
LVSYSVOL: 29 mL
Lateral S' vel: 12.3 cm/s
MV Dec: 232
MV pk A vel: 105 m/s
MVPG: 4 mmHg
MVPKEVEL: 104 m/s
RV TAPSE: 19.4 mm
Simpson's disk: 69
Stroke v: 65 ml
TDI e' lateral: 9.36
TDI e' medial: 6.2

## 2017-06-14 MED ORDER — TECHNETIUM TC 99M TETROFOSMIN IV KIT
30.0000 | PACK | Freq: Once | INTRAVENOUS | Status: AC | PRN
  Administered 2017-06-14: 30 via INTRAVENOUS

## 2017-06-14 MED ORDER — REGADENOSON 0.4 MG/5ML IV SOLN
INTRAVENOUS | Status: AC
  Administered 2017-06-14: 0.4 mg via INTRAVENOUS
  Filled 2017-06-14: qty 5

## 2017-06-14 MED ORDER — SODIUM CHLORIDE 0.9% FLUSH
INTRAVENOUS | Status: AC
  Administered 2017-06-14: 10 mL via INTRAVENOUS
  Filled 2017-06-14: qty 10

## 2017-06-14 NOTE — Progress Notes (Signed)
*  PRELIMINARY RESULTS* Echocardiogram 2D Echocardiogram has been performed.  Stacey Drain 06/14/2017, 10:11 AM

## 2017-06-15 ENCOUNTER — Other Ambulatory Visit: Payer: Self-pay | Admitting: *Deleted

## 2017-06-15 ENCOUNTER — Encounter (HOSPITAL_COMMUNITY)
Admission: RE | Admit: 2017-06-15 | Discharge: 2017-06-15 | Disposition: A | Discharge status: Home / Self Care | Payer: BLUE CROSS/BLUE SHIELD | Source: Ambulatory Visit | Attending: Cardiology | Admitting: Cardiology

## 2017-06-15 LAB — NM MYOCAR MULTI W/SPECT W/WALL MOTION / EF
CHL CUP NUCLEAR SDS: 5
LHR: 0.59
LV dias vol: 63 mL (ref 46–106)
LVSYSVOL: 18 mL
Peak HR: 93 {beats}/min
Rest HR: 69 {beats}/min
SRS: 2
SSS: 7
TID: 0.98

## 2017-06-15 MED ORDER — FLUTICASONE PROPIONATE 50 MCG/ACT NA SUSP: 2.0000 | Freq: Every day | NASAL | 5 refills | Status: DC

## 2017-06-15 MED ORDER — TECHNETIUM TC 99M TETROFOSMIN IV KIT
25.0000 | PACK | Freq: Once | INTRAVENOUS | Status: AC | PRN
  Administered 2017-06-15: 25 via INTRAVENOUS

## 2017-07-05 DIAGNOSIS — D509 Iron deficiency anemia, unspecified: Secondary | ICD-10-CM | POA: Diagnosis not present

## 2017-07-05 DIAGNOSIS — Z719 Counseling, unspecified: Secondary | ICD-10-CM | POA: Diagnosis not present

## 2017-07-05 DIAGNOSIS — Z6841 Body Mass Index (BMI) 40.0 and over, adult: Secondary | ICD-10-CM | POA: Diagnosis not present

## 2017-07-05 DIAGNOSIS — Z008 Encounter for other general examination: Secondary | ICD-10-CM | POA: Diagnosis not present

## 2017-07-05 DIAGNOSIS — Z7689 Persons encountering health services in other specified circumstances: Secondary | ICD-10-CM | POA: Diagnosis not present

## 2017-07-10 DIAGNOSIS — R945 Abnormal results of liver function studies: Secondary | ICD-10-CM | POA: Diagnosis not present

## 2017-07-10 DIAGNOSIS — D509 Iron deficiency anemia, unspecified: Secondary | ICD-10-CM | POA: Diagnosis not present

## 2017-07-10 DIAGNOSIS — E119 Type 2 diabetes mellitus without complications: Secondary | ICD-10-CM | POA: Diagnosis not present

## 2017-07-10 DIAGNOSIS — I10 Essential (primary) hypertension: Secondary | ICD-10-CM | POA: Diagnosis not present

## 2017-07-23 ENCOUNTER — Encounter: Payer: Self-pay | Admitting: Nurse Practitioner

## 2017-07-23 ENCOUNTER — Ambulatory Visit: Payer: BLUE CROSS/BLUE SHIELD | Admitting: Nurse Practitioner

## 2017-07-23 VITALS — BP 132/84 | Ht 65.0 in | Wt 296.4 lb

## 2017-07-23 DIAGNOSIS — E1169 Type 2 diabetes mellitus with other specified complication: Secondary | ICD-10-CM

## 2017-07-23 DIAGNOSIS — D509 Iron deficiency anemia, unspecified: Secondary | ICD-10-CM | POA: Diagnosis not present

## 2017-07-23 MED ORDER — METFORMIN HCL ER 750 MG PO TB24: 750.0000 mg | ORAL_TABLET | Freq: Every day | ORAL | 0 refills | Status: DC

## 2017-07-23 MED ORDER — GLIPIZIDE 5 MG PO TABS: 5.0000 mg | ORAL_TABLET | Freq: Two times a day (BID) | ORAL | 1 refills | Status: DC

## 2017-07-23 MED ORDER — DULAGLUTIDE 0.75 MG/0.5ML ~~LOC~~ SOAJ: SUBCUTANEOUS | 0 refills | Status: DC

## 2017-07-23 NOTE — Progress Notes (Signed)
Subjective:  Presents to discuss her diabetes and recent labs done through her work. Has stopped her Metformin due to GI effects. Reports 80% of her reflux and diarrhea symptoms have improved. Her brother is on long acting Metformin. Wants to try this. FBS ranging from 80-180, averaging around 150. Occasional hypoglycemia symptoms mainly in the middle of the day. Tends to skip lunch. Eats breakfast and many days does not eat again until supper. Keeps candy with her in case of low blood sugar. Continues follow up with Dr. Darrick Penna. Took a course of Prednisone in November.   Objective:   BP 132/84   Ht 5\' 5"  (1.651 m)   Wt 296 lb 6.4 oz (134.4 kg)   BMI 49.32 kg/m  NAD. Alert, oriented. Lungs clear. Heart RRR. See scanned lab report. ALT slightly elevated at 36; A1C 8.2; Hgb 11.1  Assessment:   Problem List Items Addressed This Visit      Endocrine   Diabetes (HCC) - Primary   Relevant Medications   glipiZIDE (GLUCOTROL) 5 MG tablet   metFORMIN (GLUCOPHAGE-XR) 750 MG 24 hr tablet   Dulaglutide (TRULICITY) 0.75 MG/0.5ML SOPN     Other   Anemia       Plan:   Meds ordered this encounter  Medications  . glipiZIDE (GLUCOTROL) 5 MG tablet    Sig: Take 1 tablet (5 mg total) by mouth 2 (two) times daily.    Dispense:  180 tablet    Refill:  1    Order Specific Question:   Supervising Provider    Answer:   [[id number]]  . metFORMIN (GLUCOPHAGE-XR) 750 MG 24 hr tablet    Sig: Take 1 tablet (750 mg total) by mouth daily with breakfast.    Dispense:  30 tablet    Refill:  0    Order Specific Question:   Supervising Provider    Answer:   [name] [[id number]]  . Dulaglutide (TRULICITY) 0.75 MG/0.5ML SOPN    Sig: Inject 0.5 ml subcu once a week    Dispense:  4 pen    Refill:  0    Order Specific Question:   Supervising Provider    Answer:   [name] [[id number]]   For now, continue Glipizide as directed. Trial of Glucophage XR per patient request. If this is  tolerated, continue a few weeks before trying Trulicity. Had side effects with Victoza.  Follow up in March as planned. Call back sooner if any problems. Encouraged regular activity such as walking and continued efforts at weight loss.

## 2017-07-27 ENCOUNTER — Ambulatory Visit: Payer: BLUE CROSS/BLUE SHIELD | Admitting: Nurse Practitioner

## 2017-08-27 ENCOUNTER — Encounter: Payer: Self-pay | Admitting: Nurse Practitioner

## 2017-08-27 ENCOUNTER — Ambulatory Visit: Payer: BLUE CROSS/BLUE SHIELD | Admitting: Nurse Practitioner

## 2017-08-27 VITALS — BP 120/80 | Ht 65.0 in | Wt 292.0 lb

## 2017-08-27 DIAGNOSIS — I10 Essential (primary) hypertension: Secondary | ICD-10-CM

## 2017-08-27 DIAGNOSIS — M545 Low back pain, unspecified: Secondary | ICD-10-CM

## 2017-08-27 DIAGNOSIS — G8929 Other chronic pain: Secondary | ICD-10-CM | POA: Diagnosis not present

## 2017-08-27 DIAGNOSIS — E1169 Type 2 diabetes mellitus with other specified complication: Secondary | ICD-10-CM | POA: Diagnosis not present

## 2017-08-27 LAB — POCT GLYCOSYLATED HEMOGLOBIN (HGB A1C): Hemoglobin A1C: 7.5

## 2017-08-27 MED ORDER — TRIAMTERENE-HCTZ 37.5-25 MG PO CAPS: ORAL_CAPSULE | ORAL | 1 refills | Status: DC

## 2017-08-27 MED ORDER — METOPROLOL TARTRATE 50 MG PO TABS: 50.0000 mg | ORAL_TABLET | Freq: Two times a day (BID) | ORAL | 1 refills | Status: DC

## 2017-08-27 MED ORDER — GLIPIZIDE 5 MG PO TABS: 5.0000 mg | ORAL_TABLET | Freq: Two times a day (BID) | ORAL | 1 refills | Status: DC

## 2017-08-27 MED ORDER — GABAPENTIN 100 MG PO CAPS: 100.0000 mg | ORAL_CAPSULE | Freq: Two times a day (BID) | ORAL | 1 refills | Status: DC

## 2017-08-27 MED ORDER — DULAGLUTIDE 1.5 MG/0.5ML ~~LOC~~ SOAJ: SUBCUTANEOUS | 5 refills | Status: DC

## 2017-08-27 MED ORDER — HYDROCODONE-ACETAMINOPHEN 5-325 MG PO TABS: 1.0000 | ORAL_TABLET | ORAL | 0 refills | Status: DC | PRN

## 2017-08-27 MED ORDER — METFORMIN HCL ER 750 MG PO TB24: 750.0000 mg | ORAL_TABLET | Freq: Every day | ORAL | 1 refills | Status: DC

## 2017-08-27 MED ORDER — CYCLOBENZAPRINE HCL 10 MG PO TABS: 10.0000 mg | ORAL_TABLET | Freq: Three times a day (TID) | ORAL | 5 refills | Status: DC | PRN

## 2017-08-27 MED ORDER — CITALOPRAM HYDROBROMIDE 20 MG PO TABS: 20.0000 mg | ORAL_TABLET | Freq: Every day | ORAL | 1 refills | Status: DC

## 2017-08-27 MED ORDER — AMLODIPINE BESYLATE 5 MG PO TABS: 5.0000 mg | ORAL_TABLET | Freq: Every day | ORAL | 1 refills | Status: DC

## 2017-08-27 MED ORDER — TRAMADOL HCL 50 MG PO TABS: 50.0000 mg | ORAL_TABLET | Freq: Two times a day (BID) | ORAL | 5 refills | Status: DC

## 2017-08-27 NOTE — Patient Instructions (Addendum)
Ice/heat applications Lidocaine patch

## 2017-08-27 NOTE — Progress Notes (Signed)
Subjective:    Patient ID: Tracey Turner, female    DOB: 06-Apr-1956, 62 y.o.   MRN: [id number]  HPI  Patient is here today to follow up on her DM. She is currently on glipizide 5 mg one Bid and Metformin 750 mg once daily,and on Trulicity once a week. She states she occasionally eats healthy and does not get exercise.She see Dr.Fields,Dr.Andrew Mincey for eyes.Patient states she reilizes she is here for Dm, but she is having trouble with her back. She states this is not new it has been ongoing for years.  Review of Systems Has occasional flare ups of her back pain. Has seen Dr. Lovell Sheehan in the past. Takes occasional Tramadol which has not worked over the weekend. Pain in the left lumber area, localized. Does not radiate down her legs. No specific history of injury. Would like stronger pain medication to have on hand for severe pain. BS at home 100-186. 114 this am. Doing much better on extended release Metformin. Reflux and chest discomfort completely resolved.  No CP/ischemic type pain or SOB. No numbness or weakness of the face, arms or legs. No difficulty speaking or swallowing.      Objective:   Physical Exam NAD. Alert, oriented. Lungs clear. Heart RRR. Carotids no bruits or thrills.  Results for orders placed or performed in visit on 08/27/17  POCT glycosylated hemoglobin (Hb A1C)  Result Value Ref Range   Hemoglobin A1C 7.5    Last A1C at work 8.2       Assessment & Plan:   Problem List Items Addressed This Visit      Cardiovascular and Mediastinum   HTN (hypertension)   Relevant Medications   amLODipine (NORVASC) 5 MG tablet   metoprolol tartrate (LOPRESSOR) 50 MG tablet   triamterene-hydrochlorothiazide (DYAZIDE) 37.5-25 MG capsule     Endocrine   Diabetes (HCC) - Primary   Relevant Medications   glipiZIDE (GLUCOTROL) 5 MG tablet   metFORMIN (GLUCOPHAGE-XR) 750 MG 24 hr tablet   Dulaglutide (TRULICITY) 1.5 MG/0.5ML SOPN   Other Relevant Orders   POCT glycosylated  hemoglobin (Hb A1C) (Completed)     Other   Morbid obesity (HCC)   Relevant Medications   glipiZIDE (GLUCOTROL) 5 MG tablet   metFORMIN (GLUCOPHAGE-XR) 750 MG 24 hr tablet   Dulaglutide (TRULICITY) 1.5 MG/0.5ML SOPN    Other Visit Diagnoses    Chronic bilateral low back pain without sciatica       Relevant Medications   cyclobenzaprine (FLEXERIL) 10 MG tablet   traMADol (ULTRAM) 50 MG tablet   HYDROcodone-acetaminophen (NORCO/VICODIN) 5-325 MG tablet       Meds ordered this encounter  Medications  . amLODipine (NORVASC) 5 MG tablet    Sig: Take 1 tablet (5 mg total) by mouth daily.    Dispense:  90 tablet    Refill:  1    Order Specific Question:   Supervising Provider    Answer:   [name] [[id number]]  . cyclobenzaprine (FLEXERIL) 10 MG tablet    Sig: Take 1 tablet (10 mg total) by mouth 3 (three) times daily as needed. Muscle spasms    Dispense:  30 tablet    Refill:  5    Order Specific Question:   Supervising Provider    Answer:   [name] [[id number]]  . gabapentin (NEURONTIN) 100 MG capsule    Sig: Take 1 capsule (100 mg total) by mouth 2 (two) times daily.    Dispense:  180  capsule    Refill:  1    Please consider 90 day supplies to promote better adherence    Order Specific Question:   Supervising Provider    Answer:   [name] [[id number]]  . glipiZIDE (GLUCOTROL) 5 MG tablet    Sig: Take 1 tablet (5 mg total) by mouth 2 (two) times daily.    Dispense:  180 tablet    Refill:  1    Order Specific Question:   Supervising Provider    Answer:   [name] [[id number]]  . metFORMIN (GLUCOPHAGE-XR) 750 MG 24 hr tablet    Sig: Take 1 tablet (750 mg total) by mouth daily with breakfast.    Dispense:  90 tablet    Refill:  1    Order Specific Question:   Supervising Provider    Answer:   [name] [[id number]]  . metoprolol tartrate (LOPRESSOR) 50 MG tablet    Sig: Take 1 tablet (50 mg total) by mouth 2 (two) times daily.    Dispense:  180 tablet     Refill:  1    Order Specific Question:   Supervising Provider    Answer:   [name] [[id number]]  . traMADol (ULTRAM) 50 MG tablet    Sig: Take 1 tablet (50 mg total) by mouth 2 (two) times daily. pain    Dispense:  60 tablet    Refill:  5    Order Specific Question:   Supervising Provider    Answer:   [name] [[id number]]  . HYDROcodone-acetaminophen (NORCO/VICODIN) 5-325 MG tablet    Sig: Take 1 tablet by mouth every 4 (four) hours as needed.    Dispense:  18 tablet    Refill:  0    Order Specific Question:   Supervising Provider    Answer:   [name] [[id number]]  . Dulaglutide (TRULICITY) 1.5 MG/0.5ML SOPN    Sig: Inject 0.5 ml (1.5 mg) subcu once a week    Dispense:  4 pen    Refill:  5    Order Specific Question:   Supervising Provider    Answer:   [name] [[id number]]  . citalopram (CELEXA) 20 MG tablet    Sig: Take 1 tablet (20 mg total) by mouth daily.    Dispense:  90 tablet    Refill:  1    Order Specific Question:   Supervising Provider    Answer:   [name] [[id number]]  . triamterene-hydrochlorothiazide (DYAZIDE) 37.5-25 MG capsule    Sig: TAKE 1 CAPSULE BY MOUTH  EACH DAY    Dispense:  90 capsule    Refill:  1    Order Specific Question:   Supervising Provider    Answer:   Riccardo Dubin   PMP reviewed. Encouraged activity as tolerated.  Ice/heat applications Lidocaine patch Use Hydrocodone only with severe pain. Drowsiness precautions. Do not take with Tramadol.  Increase Trulicity dose.  Return in about 4 months (around 12/27/2017). 25 minutes was spent with the patient.  This statement verifies that 25 minutes was indeed spent with the patient. Greater than half the time was spent in discussion, counseling and answering questions  regarding the issues that the patient came in for today as reflected in the diagnosis (s) please refer to documentation for further details.

## 2017-09-20 DIAGNOSIS — Z719 Counseling, unspecified: Secondary | ICD-10-CM | POA: Diagnosis not present

## 2017-09-20 DIAGNOSIS — Z008 Encounter for other general examination: Secondary | ICD-10-CM | POA: Diagnosis not present

## 2017-09-20 DIAGNOSIS — Z7689 Persons encountering health services in other specified circumstances: Secondary | ICD-10-CM | POA: Diagnosis not present

## 2017-09-20 DIAGNOSIS — D509 Iron deficiency anemia, unspecified: Secondary | ICD-10-CM | POA: Diagnosis not present

## 2017-09-27 ENCOUNTER — Other Ambulatory Visit: Payer: Self-pay | Admitting: Nurse Practitioner

## 2017-09-27 ENCOUNTER — Telehealth: Payer: Self-pay | Admitting: Nurse Practitioner

## 2017-09-27 MED ORDER — DULAGLUTIDE 0.75 MG/0.5ML ~~LOC~~ SOAJ: SUBCUTANEOUS | 5 refills | Status: DC

## 2017-09-27 NOTE — Telephone Encounter (Signed)
Contacted pt to inform her that previous dose was ordered. Pt stated she does not want it through [employer], she would like it to be sent to [facility] so she can get it faster. Pt stated she would call [facility] when she returned home and cancel it and then will call us tomorrow.

## 2017-09-27 NOTE — Telephone Encounter (Signed)
Pt states that the higher dose of trulicity makes her sick pt states that she can go back to the lower dose if Eber Jones wants to prescribe her that. Please advise. Pt is aware carolyn is out till tomorrow.

## 2017-09-27 NOTE — Telephone Encounter (Signed)
Sent this in to [facility] mail in which is where her last one was sent to.

## 2017-09-27 NOTE — Telephone Encounter (Signed)
Yes. I will go back to the previous dose but not sure how her insurance will handle this due to the expense.

## 2017-09-28 ENCOUNTER — Telehealth: Payer: Self-pay | Admitting: Nurse Practitioner

## 2017-09-28 ENCOUNTER — Other Ambulatory Visit: Payer: Self-pay | Admitting: Nurse Practitioner

## 2017-09-28 MED ORDER — DULAGLUTIDE 0.75 MG/0.5ML ~~LOC~~ SOAJ: SUBCUTANEOUS | 5 refills | Status: DC

## 2017-09-28 NOTE — Telephone Encounter (Signed)
Patient had Rx for Trulicity called in yesterday to [facility].  She said its going to cost her way too much money to have filled at [facility].  She would like this sent to [facility] because it will only cost her $25.  She has already called [facility] and cancelled the original order.

## 2017-09-28 NOTE — Telephone Encounter (Signed)
Done

## 2017-10-03 ENCOUNTER — Encounter: Payer: Self-pay | Admitting: Gastroenterology

## 2017-10-03 ENCOUNTER — Ambulatory Visit: Payer: BLUE CROSS/BLUE SHIELD | Admitting: Gastroenterology

## 2017-10-03 DIAGNOSIS — K529 Noninfective gastroenteritis and colitis, unspecified: Secondary | ICD-10-CM

## 2017-10-03 DIAGNOSIS — D500 Iron deficiency anemia secondary to blood loss (chronic): Secondary | ICD-10-CM

## 2017-10-03 DIAGNOSIS — K219 Gastro-esophageal reflux disease without esophagitis: Secondary | ICD-10-CM | POA: Diagnosis not present

## 2017-10-03 NOTE — Assessment & Plan Note (Signed)
NO BRBPR OR MELENA. NO F/U Hb IN OUR SYSTEM. AUG 2018: Hb 7.1.  OBTAIN LABS FROM [facility] TO REVIEW. PLEASE CALL WITH QUESTIONS OR CONCERNS. FOLLOW UP IN 1-2 YEARS.

## 2017-10-03 NOTE — Progress Notes (Signed)
Subjective:    Patient ID: Tracey Turner, female    DOB: Apr 27, 1956, 62 y.o.   MRN: [id number]  Tracey Albert, MD  HPI FEELS BETTER FROM LAST VISIT. CHANGED METFORMIN FROM SHORT ACTING TO EXTENDED RELIEF. BMs: NL. UPPED DOSE OF TRULICITY AND DIDN'T FEEL GOOD. ONCE MEDICINE GOT OUT OF HER SYSTEM SHE WENT BACK TO LOWER DOSE. ANYTHING SHE CAN TAKE OTC FOR PAIN. LAST TIME HAD LIVER ENZYMES:??. BMs: EVERY DAY. FEELS TIRED.  PT DENIES FEVER, CHILLS, HEMATOCHEZIA, HEMATEMESIS, nausea, vomiting, melena, diarrhea, CHEST PAIN, SHORTNESS OF BREATH,  CHANGE IN BOWEL IN HABITS, constipation, abdominal pain, problems swallowing, OR heartburn or indigestion.  Past Medical History:  Diagnosis Date  . Anemia   . Arthritis   . Back pain   . Gastritis   . Glaucoma   . Hypertension   . Knee pain   . Type 2 diabetes mellitus (HCC)     Past Surgical History:  Procedure Laterality Date  . COLONOSCOPY  02/13/2011   internal hemorrhoids  . ESOPHAGOGASTRODUODENOSCOPY  02/13/2011   mild gatritis  . GIVENS CAPSULE STUDY  03/28/2012      . GIVENS CAPSULE STUDY  03/26/2012   Procedure: GIVENS CAPSULE STUDY;  Surgeon: West Bali, MD;  Location: [facility];  Service: Endoscopy;  Laterality: N/A;  7:30  . None     Current Outpatient Medications  Medication Sig Dispense Refill  . acetaminophen (TYLENOL) 650 MG CR tablet Take 650 mg by mouth every 8 (eight) hours as needed. arthritis    . amLODipine (NORVASC) 5 MG tablet Take 1 tablet (5 mg total) by mouth daily.    . Calcium Carbonate-Vitamin D (CALCIUM + D PO) Take by mouth daily.     . cetirizine (ZYRTEC) 10 MG tablet Take 10 mg by mouth daily.    . citalopram (CELEXA) 20 MG tablet Take 1 tablet (20 mg total) by mouth daily.    . cyclobenzaprine (FLEXERIL) 10 MG tablet Take 1 tablet (10 mg total) by mouth 3 (three) times daily as needed. Muscle spasms    . Dulaglutide (TRULICITY) 0.75 MG/0.5ML SOPN Inject 0.75 mg subcu once a week.    .  famotidine (PEPCID) 20 MG tablet Take 1 tablet (20 mg total) by mouth 2 (two) times daily.    . ferrous sulfate dried (SLOW FE) 160 (50 FE) MG TBCR Take 160 mg by mouth 3 (three) times daily with meals.     . fluticasone (FLONASE) 50 MCG/ACT nasal spray Place 2 sprays into both nostrils daily.    Marland Kitchen gabapentin (NEURONTIN) 100 MG capsule Take 1 capsule (100 mg total) by mouth 2 (two) times daily.    Marland Kitchen glipiZIDE (GLUCOTROL) 5 MG tablet Take 1 tablet (5 mg total) by mouth 2 (two) times daily.    Marland Kitchen glucose blood (ONE TOUCH ULTRA TEST) test strip Use as instructed BID prn    .      . latanoprost (XALATAN) 0.005 % ophthalmic solution INSTILL 1 DROP IN EACH EYE  AT BEDTIME    . metFORMIN (GLUCOPHAGE-XR) 750 MG 24 hr tablet Take 1 tablet (750 mg total) by mouth daily with breakfast.    . metoprolol tartrate (LOPRESSOR) 50 MG tablet Take 1 tablet (50 mg total) by mouth 2 (two) times daily.    . Multiple Vitamin (MULTIVITAMIN WITH MINERALS) TABS Take 1 tablet by mouth daily.    Marland Kitchen nystatin (MYCOSTATIN) 100000 UNIT/ML suspension Take 5 mLs by mouth 4 (four) times daily.    Marland Kitchen  traMADol (ULTRAM) 50 MG tablet Take 1 tablet (50 mg total) by mouth 2 (two) times daily. pain    . triamterene-hydrochlorothiazide (DYAZIDE) 37.5-25 MG capsule TAKE 1 CAPSULE BY MOUTH  EACH DAY    .      Review of Systems PER HPI OTHERWISE ALL SYSTEMS ARE NEGATIVE.    Objective:   Physical Exam  Constitutional: She is oriented to person, place, and time. She appears well-developed and well-nourished. No distress.  HENT:  Head: Normocephalic and atraumatic.  Mouth/Throat: Oropharynx is clear and moist. No oropharyngeal exudate.  Eyes: Pupils are equal, round, and reactive to light. No scleral icterus.  Neck: Normal range of motion. Neck supple.  Cardiovascular: Normal rate, regular rhythm and normal heart sounds.  Pulmonary/Chest: Effort normal and breath sounds normal. No respiratory distress.  Abdominal: Soft. Bowel sounds are  normal. She exhibits no distension. There is no tenderness.  Musculoskeletal: She exhibits edema (TRACE BIL LOWER EXTREMITIES).  Lymphadenopathy:    She has no cervical adenopathy.  Neurological: She is alert and oriented to person, place, and time.  Psychiatric: She has a normal mood and affect.  Vitals reviewed.     Assessment & Plan:

## 2017-10-03 NOTE — Patient Instructions (Addendum)
DRINK WATER TO KEEP YOUR URINE LIGHT YELLOW.  FOLLOW A HIGH FIBER DIET. AVOID ITEMS THAT CAUSE BLOATING & GAS.  PLEASE CALL WITH QUESTIONS OR CONCERNS.  FOLLOW UP IN 1-2 YEARS.

## 2017-10-03 NOTE — Assessment & Plan Note (Signed)
SYMPTOMS CONTROLLED/RESOLVED. WEIGHT STABLE. LIKES TO COOK.  CONTINUE TO MONITOR SYMPTOMS. FOLLOW UP IN 1-2 YEARS.

## 2017-10-03 NOTE — Assessment & Plan Note (Signed)
DUE TO METFORMIN. SYMPTOMS CONTROLLED/RESOLVED.  CONTINUE TO MONITOR SYMPTOMS. FOLLOW UP IN 1-2 YEARS.

## 2017-10-04 ENCOUNTER — Ambulatory Visit: Payer: BLUE CROSS/BLUE SHIELD | Admitting: Gastroenterology

## 2017-10-04 NOTE — Progress Notes (Signed)
cc'ed to pcp °

## 2017-10-04 NOTE — Progress Notes (Signed)
ON RECALL  °

## 2017-10-15 DIAGNOSIS — H40053 Ocular hypertension, bilateral: Secondary | ICD-10-CM | POA: Diagnosis not present

## 2017-11-16 ENCOUNTER — Telehealth: Payer: Self-pay | Admitting: Family Medicine

## 2017-11-16 DIAGNOSIS — Z0289 Encounter for other administrative examinations: Secondary | ICD-10-CM

## 2017-11-16 NOTE — Telephone Encounter (Signed)
Message for Carolyn--Patient dropped off FMLA to be fill in for updating her paperwork for diabetes. In your box ,please fill in highlighted areas,form has changed ,date and sign.

## 2017-11-26 ENCOUNTER — Telehealth: Payer: Self-pay | Admitting: *Deleted

## 2017-11-26 NOTE — Telephone Encounter (Signed)
Pt returned called. Pt stated that she has an appt on July 10 with Eber Jones and they can discuss this at that time.

## 2017-11-26 NOTE — Telephone Encounter (Signed)
Left message to return call. Tracey Turner filling out fmla forms and had a question about the average days she missed work for appointments per month. See form in nurse folder

## 2017-11-26 NOTE — Telephone Encounter (Signed)
Noted  

## 2017-12-04 DIAGNOSIS — Z139 Encounter for screening, unspecified: Secondary | ICD-10-CM | POA: Diagnosis not present

## 2017-12-04 DIAGNOSIS — Z79899 Other long term (current) drug therapy: Secondary | ICD-10-CM | POA: Diagnosis not present

## 2017-12-04 DIAGNOSIS — D509 Iron deficiency anemia, unspecified: Secondary | ICD-10-CM | POA: Diagnosis not present

## 2017-12-04 DIAGNOSIS — E119 Type 2 diabetes mellitus without complications: Secondary | ICD-10-CM | POA: Diagnosis not present

## 2017-12-04 NOTE — Telephone Encounter (Signed)
Done. Given to Erica. 

## 2017-12-13 DIAGNOSIS — E119 Type 2 diabetes mellitus without complications: Secondary | ICD-10-CM | POA: Diagnosis not present

## 2017-12-13 DIAGNOSIS — I10 Essential (primary) hypertension: Secondary | ICD-10-CM | POA: Diagnosis not present

## 2017-12-13 DIAGNOSIS — D509 Iron deficiency anemia, unspecified: Secondary | ICD-10-CM | POA: Diagnosis not present

## 2017-12-24 ENCOUNTER — Telehealth: Payer: Self-pay | Admitting: Family Medicine

## 2017-12-24 NOTE — Telephone Encounter (Signed)
Review blood work results on desk in yellow folder.

## 2017-12-26 ENCOUNTER — Encounter: Payer: Self-pay | Admitting: Nurse Practitioner

## 2017-12-26 ENCOUNTER — Ambulatory Visit: Payer: BLUE CROSS/BLUE SHIELD | Admitting: Nurse Practitioner

## 2017-12-26 VITALS — BP 142/82 | Ht 66.0 in | Wt 292.8 lb

## 2017-12-26 DIAGNOSIS — E1169 Type 2 diabetes mellitus with other specified complication: Secondary | ICD-10-CM | POA: Diagnosis not present

## 2017-12-26 DIAGNOSIS — D649 Anemia, unspecified: Secondary | ICD-10-CM

## 2017-12-26 DIAGNOSIS — I10 Essential (primary) hypertension: Secondary | ICD-10-CM | POA: Diagnosis not present

## 2017-12-26 LAB — POCT HEMOGLOBIN: Hemoglobin: 11.6 g/dL — AB (ref 12.2–16.2)

## 2017-12-26 MED ORDER — LIDOCAINE 5 % EX PTCH: 1.0000 | MEDICATED_PATCH | CUTANEOUS | 0 refills | Status: DC

## 2017-12-26 MED ORDER — CLONAZEPAM 0.5 MG PO TABS
0.5000 mg | ORAL_TABLET | Freq: Two times a day (BID) | ORAL | 0 refills | Status: DC | PRN
Start: 2017-12-26 — End: 2019-06-06

## 2017-12-26 NOTE — Progress Notes (Signed)
Subjective:  Presents for a recheck on her diabetes and review of recent labs. States her BS at home have been running well overall. Less than 200. Adherent to med regimen. Has a history of anemia. Taking iron supplements daily. Sees Dr. Darrick Penna for GI issues. Using OTC Lidocaine patches which greatly helps orthopedic pain. Has been under increased stress lately. Does not want or need medication every day. Has been taking Celexa 20 mg as needed. Has noticed an increased appetite when this is used.  Denies CP/ischemic type pain or SOB. No visual changes. No difficulty speaking or swallowing. No numbness or weakness of the face, arms or legs. Mild swelling in the lower legs at times.    Objective:   BP (!) 142/82   Ht 5\' 6"  (1.676 m)   Wt 292 lb 12.8 oz (132.8 kg)   BMI 47.26 kg/m  NAD. Alert, oriented. Lungs clear. Heart RRR. Carotids no bruits or thrills.  See scanned labs dated 12/04/17. FBS 117; hb 10.9; A1c 8.0. Results for orders placed or performed in visit on 12/26/17  POCT hemoglobin  Result Value Ref Range   Hemoglobin 11.6 (A) 12.2 - 16.2 g/dL     Assessment:   Problem List Items Addressed This Visit      Cardiovascular and Mediastinum   HTN (hypertension)     Endocrine   Diabetes (HCC) - Primary     Other   Anemia   Relevant Orders   POCT hemoglobin (Completed)       Plan:   Meds ordered this encounter  Medications  . clonazePAM (KLONOPIN) 0.5 MG tablet    Sig: Take 1 tablet (0.5 mg total) by mouth 2 (two) times daily as needed for anxiety.    Dispense:  30 tablet    Refill:  0    Order Specific Question:   Supervising Provider    Answer:   02/26/18 [[id number]]  . lidocaine (LIDODERM) 5 %    Sig: Place 1 patch onto the skin daily. Remove & Discard patch within 12 hours.    Dispense:  30 patch    Refill:  0    Order Specific Question:   Supervising Provider    Answer:   [name] [[id number]]   Stop Celexa. Explained that this medicine is designed for  daily use. Trial of Klonopin, use sparingly as needed.  Do not take with pain meds or muscle relaxants.  Given Rx for Lidoderm patches to see if this is covered by insurance.  Discussed importance of regular activity such as walking, healthy diet lower in sugar and simple carbs and continued weight loss efforts.  Return in about 4 months (around 04/28/2018) for diabetes check up.

## 2017-12-26 NOTE — Patient Instructions (Signed)
Clonazepam should not be taken within 4 hours of pain med or muscle relaxants.

## 2017-12-26 NOTE — Telephone Encounter (Signed)
Discussed during visit today.

## 2018-01-17 DIAGNOSIS — Z7689 Persons encountering health services in other specified circumstances: Secondary | ICD-10-CM | POA: Diagnosis not present

## 2018-01-17 DIAGNOSIS — D509 Iron deficiency anemia, unspecified: Secondary | ICD-10-CM | POA: Diagnosis not present

## 2018-01-17 DIAGNOSIS — J302 Other seasonal allergic rhinitis: Secondary | ICD-10-CM | POA: Diagnosis not present

## 2018-01-17 DIAGNOSIS — E119 Type 2 diabetes mellitus without complications: Secondary | ICD-10-CM | POA: Diagnosis not present

## 2018-01-17 DIAGNOSIS — I10 Essential (primary) hypertension: Secondary | ICD-10-CM | POA: Diagnosis not present

## 2018-01-25 IMAGING — NM NM MYOCAR MULTI W/SPECT W/WALL MOTION & EF
2 series · 12 of 12 positions shown · non-contrast
Comparison: none

[Series 2: stress gated - perfusion · 6.51mm/px · 6 of 64 frames shown]
[frame 6/64]
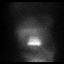
[frame 16/64]
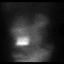
[frame 27/64]
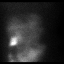
[frame 38/64]
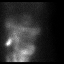
[frame 48/64]
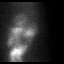
[frame 59/64]
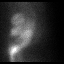

[Series 3: rest · 6.51mm/px · 6 of 64 frames shown]
[frame 6/64]
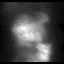
[frame 16/64]
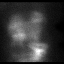
[frame 27/64]
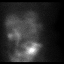
[frame 38/64]
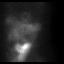
[frame 48/64]
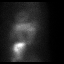
[frame 59/64]
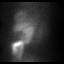

[12 of 12 positions shown; findings below may reference images not displayed]

Canned report from images found in remote index.

Refer to host system for actual result text.

## 2018-02-26 DIAGNOSIS — Z0130 Encounter for examination of blood pressure without abnormal findings: Secondary | ICD-10-CM | POA: Diagnosis not present

## 2018-02-26 DIAGNOSIS — E119 Type 2 diabetes mellitus without complications: Secondary | ICD-10-CM | POA: Diagnosis not present

## 2018-02-26 DIAGNOSIS — E559 Vitamin D deficiency, unspecified: Secondary | ICD-10-CM | POA: Diagnosis not present

## 2018-02-26 DIAGNOSIS — H40053 Ocular hypertension, bilateral: Secondary | ICD-10-CM | POA: Diagnosis not present

## 2018-02-26 DIAGNOSIS — Z139 Encounter for screening, unspecified: Secondary | ICD-10-CM | POA: Diagnosis not present

## 2018-02-26 DIAGNOSIS — D509 Iron deficiency anemia, unspecified: Secondary | ICD-10-CM | POA: Diagnosis not present

## 2018-02-26 DIAGNOSIS — Z79899 Other long term (current) drug therapy: Secondary | ICD-10-CM | POA: Diagnosis not present

## 2018-03-04 ENCOUNTER — Other Ambulatory Visit: Payer: Self-pay | Admitting: *Deleted

## 2018-03-04 MED ORDER — GABAPENTIN 100 MG PO CAPS: 100.0000 mg | ORAL_CAPSULE | Freq: Two times a day (BID) | ORAL | 1 refills | Status: DC

## 2018-03-04 MED ORDER — METFORMIN HCL ER 750 MG PO TB24: 750.0000 mg | ORAL_TABLET | Freq: Every day | ORAL | 1 refills | Status: DC

## 2018-03-04 MED ORDER — DULAGLUTIDE 0.75 MG/0.5ML ~~LOC~~ SOAJ: SUBCUTANEOUS | 5 refills | Status: DC

## 2018-03-04 MED ORDER — AMLODIPINE BESYLATE 5 MG PO TABS: 5.0000 mg | ORAL_TABLET | Freq: Every day | ORAL | 1 refills | Status: DC

## 2018-03-19 DIAGNOSIS — Z23 Encounter for immunization: Secondary | ICD-10-CM | POA: Diagnosis not present

## 2018-03-26 DIAGNOSIS — E119 Type 2 diabetes mellitus without complications: Secondary | ICD-10-CM | POA: Diagnosis not present

## 2018-03-26 DIAGNOSIS — Z7689 Persons encountering health services in other specified circumstances: Secondary | ICD-10-CM | POA: Diagnosis not present

## 2018-03-26 DIAGNOSIS — Z008 Encounter for other general examination: Secondary | ICD-10-CM | POA: Diagnosis not present

## 2018-03-26 DIAGNOSIS — D509 Iron deficiency anemia, unspecified: Secondary | ICD-10-CM | POA: Diagnosis not present

## 2018-04-18 ENCOUNTER — Encounter: Payer: Self-pay | Admitting: Family Medicine

## 2018-04-18 DIAGNOSIS — E559 Vitamin D deficiency, unspecified: Secondary | ICD-10-CM | POA: Diagnosis not present

## 2018-04-18 DIAGNOSIS — D509 Iron deficiency anemia, unspecified: Secondary | ICD-10-CM | POA: Diagnosis not present

## 2018-04-25 DIAGNOSIS — E559 Vitamin D deficiency, unspecified: Secondary | ICD-10-CM | POA: Diagnosis not present

## 2018-04-25 DIAGNOSIS — D509 Iron deficiency anemia, unspecified: Secondary | ICD-10-CM | POA: Diagnosis not present

## 2018-04-30 ENCOUNTER — Ambulatory Visit: Payer: BLUE CROSS/BLUE SHIELD | Admitting: Family Medicine

## 2018-04-30 ENCOUNTER — Encounter: Payer: Self-pay | Admitting: Family Medicine

## 2018-04-30 VITALS — BP 142/72 | Ht 66.0 in | Wt 294.0 lb

## 2018-04-30 DIAGNOSIS — Z23 Encounter for immunization: Secondary | ICD-10-CM | POA: Diagnosis not present

## 2018-04-30 DIAGNOSIS — I10 Essential (primary) hypertension: Secondary | ICD-10-CM

## 2018-04-30 DIAGNOSIS — D649 Anemia, unspecified: Secondary | ICD-10-CM | POA: Diagnosis not present

## 2018-04-30 DIAGNOSIS — E119 Type 2 diabetes mellitus without complications: Secondary | ICD-10-CM | POA: Diagnosis not present

## 2018-04-30 LAB — POCT GLYCOSYLATED HEMOGLOBIN (HGB A1C): HEMOGLOBIN A1C: 7.5 % — AB (ref 4.0–5.6)

## 2018-04-30 LAB — POCT HEMOGLOBIN: HEMOGLOBIN: 11.4 g/dL (ref 9.5–13.5)

## 2018-04-30 MED ORDER — GLIPIZIDE 5 MG PO TABS
5.0000 mg | ORAL_TABLET | Freq: Two times a day (BID) | ORAL | 1 refills | Status: DC
Start: 2018-04-30 — End: 2018-10-18

## 2018-04-30 MED ORDER — METOPROLOL TARTRATE 50 MG PO TABS: 50.0000 mg | ORAL_TABLET | Freq: Two times a day (BID) | ORAL | 1 refills | Status: DC

## 2018-04-30 MED ORDER — GLIPIZIDE 5 MG PO TABS: 5.0000 mg | ORAL_TABLET | Freq: Two times a day (BID) | ORAL | 1 refills | Status: DC

## 2018-04-30 MED ORDER — TRIAMTERENE-HCTZ 37.5-25 MG PO CAPS: ORAL_CAPSULE | ORAL | 1 refills | Status: DC

## 2018-04-30 MED ORDER — METFORMIN HCL ER 750 MG PO TB24: 1500.0000 mg | ORAL_TABLET | Freq: Every day | ORAL | 1 refills | Status: DC

## 2018-04-30 NOTE — Progress Notes (Signed)
Subjective:    Patient ID: Tracey Turner, female    DOB: January 26, 1956, 62 y.o.   MRN: [id number]  Diabetes  She presents for her follow-up diabetic visit. She has type 2 diabetes mellitus. Pertinent negatives for hypoglycemia include no dizziness or headaches. Pertinent negatives for diabetes include no chest pain, no polydipsia, no polyphagia, no polyuria and no weakness. Home blood sugar record trend: 150 and below. She does not see a podiatrist.Eye exam is current (goes next week, last appt about 6 months ago).   Results for orders placed or performed in visit on 04/30/18  POCT glycosylated hemoglobin (Hb A1C)  Result Value Ref Range   Hemoglobin A1C 7.5 (A) 4.0 - 5.6 %   HbA1c POC (<> result, manual entry)     HbA1c, POC (prediabetic range)     HbA1c, POC (controlled diabetic range)    POCT hemoglobin  Result Value Ref Range   Hemoglobin 11.4 9.5 - 13.5 g/dL   Fasting blood sugars typically 150 or less. Denies any low blood sugars in the last couple months. Denies hypoglycemia symptoms. Denies paresthesias. Has eye exam scheduled next week, f/u with them every 6 months. Compliant with medications.   HTN: BP has been 110/70s at work; reports at home it is usually 120-130/70s. Denies adverse effects. Compliant with medications.  Anemia: chronic, long-standing. Taking iron supplement. Denies any blood in stool or black/tarry stools. Has regular f/u with Dr. Darrick Penna in GI.   Review of Systems  Eyes: Negative for visual disturbance.  Respiratory: Negative for shortness of breath.   Cardiovascular: Negative for chest pain and leg swelling.  Gastrointestinal: Negative for blood in stool.  Endocrine: Negative for polydipsia, polyphagia and polyuria.  Neurological: Negative for dizziness, syncope, weakness, numbness and headaches.       Objective:   Physical Exam  Constitutional: She is oriented to person, place, and time. She appears well-developed and well-nourished. No distress.    HENT:  Head: Normocephalic and atraumatic.  Neck: Neck supple.  Cardiovascular: Normal rate, regular rhythm and normal heart sounds.  No murmur heard. Pulmonary/Chest: Effort normal and breath sounds normal. No respiratory distress.  Neurological: She is alert and oriented to person, place, and time.  Skin: Skin is warm and dry.  Psychiatric: She has a normal mood and affect.  Nursing note and vitals reviewed.         Assessment & Plan:  1. Diabetes mellitus without complication (HCC) - Plan: POCT glycosylated hemoglobin (Hb A1C), Microalbumin / creatinine urine ratio Awaiting results of ACR urine. Goal A1c is less than 7, at 7.5 today, will increase metformin to 1500 mg daily.  Pt states she did not tolerate an increase to the trulicity.  She will keep track of blood sugars, discussed random sampling, and requesting her to send those to [place] in the next month. F/u 6 months.  2. Essential hypertension BP elevated today and on repeat, pt reports she is under stress as her brother is sick, states BP is usually normal. Discussed monitoring BP at home and keeping log and sending [place] that information in the next month. Continue with current medications.   3. Anemia, unspecified type - Plan: POCT hemoglobin Likely of chronic disease, has remained stable over the last 7 years. Discussed if pt wishes we can refer her to hematology for further evaluation. She declines at this time but will let us know if she changes her mind.   4. Need for vaccination - Plan: Tdap vaccine greater than  or equal to 7yo IM  F/u in 6 months.  Dr. Lubertha South was consulted on this case and is in agreement with the above treatment plan.

## 2018-04-30 NOTE — Progress Notes (Signed)
3

## 2018-04-30 NOTE — Patient Instructions (Signed)
Fasting blood sugars between 100-130 Before meals less than 160 Bedtime less than 180

## 2018-05-01 ENCOUNTER — Telehealth: Payer: Self-pay

## 2018-05-01 LAB — SPECIMEN STATUS REPORT

## 2018-05-01 LAB — MICROALBUMIN / CREATININE URINE RATIO
Creatinine, Urine: 113 mg/dL
MICROALBUM., U, RANDOM: 33.3 ug/mL
Microalb/Creat Ratio: 29.5 mg/g creat (ref 0.0–30.0)

## 2018-05-01 NOTE — Telephone Encounter (Signed)
We received lab results for pt that was ordered by Clovis Riley. Results placed on Dr. Darrick Penna desk for review.

## 2018-05-01 NOTE — Telephone Encounter (Signed)
LABS REVIEWED. Hb 11.4 MCV 81.

## 2018-05-02 ENCOUNTER — Other Ambulatory Visit: Payer: Self-pay | Admitting: Gastroenterology

## 2018-05-06 ENCOUNTER — Telehealth: Payer: Self-pay | Admitting: Family Medicine

## 2018-05-06 ENCOUNTER — Telehealth: Payer: Self-pay | Admitting: Gastroenterology

## 2018-05-06 ENCOUNTER — Other Ambulatory Visit: Payer: Self-pay | Admitting: *Deleted

## 2018-05-06 DIAGNOSIS — H40053 Ocular hypertension, bilateral: Secondary | ICD-10-CM | POA: Diagnosis not present

## 2018-05-06 LAB — HM DIABETES EYE EXAM

## 2018-05-06 NOTE — Telephone Encounter (Signed)
metFORMIN (GLUCOPHAGE-XR) 750 MG 24 hr tablet 1,500 mg, Daily with breakfast 1 ordered        Summary: Take 2 tablets (1,500 mg total) by mouth daily with breakfast      Pharmacy confirmed that the do have the new script- the mg is the same but she takes 2 tabs to equal 1500mg   Left message to return call to notify patient.

## 2018-05-06 NOTE — Telephone Encounter (Signed)
Pt said her pharmacy [facility] in [place] had faxed [place] a refill request for her generic Pepcid AC and needed a 30 day supply.

## 2018-05-06 NOTE — Telephone Encounter (Signed)
Patient notified and verbalized understanding. 

## 2018-05-06 NOTE — Telephone Encounter (Signed)
Patient was seen 11/12 and her metformin HCI mg was supposed to be increase to 1,500 mg 30 day supply but the same mg last refill was called into [facility]- [place].

## 2018-05-07 NOTE — Telephone Encounter (Signed)
Noted, the request is in the refill box.

## 2018-05-09 MED ORDER — FAMOTIDINE 20 MG PO TABS: 20.0000 mg | ORAL_TABLET | Freq: Two times a day (BID) | ORAL | 11 refills | Status: DC

## 2018-05-10 ENCOUNTER — Encounter: Payer: Self-pay | Admitting: *Deleted

## 2018-05-20 ENCOUNTER — Other Ambulatory Visit: Payer: Self-pay

## 2018-05-20 ENCOUNTER — Telehealth: Payer: Self-pay | Admitting: Family Medicine

## 2018-05-20 MED ORDER — TRAMADOL HCL 50 MG PO TABS: 50.0000 mg | ORAL_TABLET | Freq: Two times a day (BID) | ORAL | 0 refills | Status: DC

## 2018-05-20 NOTE — Telephone Encounter (Signed)
May have 1 refill I am not seeing where this particular medication has been addressed recently Please send message to Dr. Brett Canales to decide if patient needs specific visit for pain medicine

## 2018-05-20 NOTE — Telephone Encounter (Signed)
Please advise. Thank you

## 2018-05-20 NOTE — Telephone Encounter (Signed)
Pharmacy requesting refill on Tramadol 50 mg tablet. Take one tablet by mouth twice daily for pain.

## 2018-05-26 NOTE — Telephone Encounter (Signed)
Cont  q 6 mo visits seen recently

## 2018-05-27 ENCOUNTER — Telehealth: Payer: Self-pay | Admitting: Family Medicine

## 2018-05-27 MED ORDER — METFORMIN HCL 500 MG PO TABS: 500.0000 mg | ORAL_TABLET | Freq: Two times a day (BID) | ORAL | 5 refills | Status: DC

## 2018-05-27 NOTE — Telephone Encounter (Signed)
Left message to return call 

## 2018-05-27 NOTE — Telephone Encounter (Signed)
How aboput 500 bid? A1c was 7.5 recently, hate to take away 24 hr coverage

## 2018-05-27 NOTE — Telephone Encounter (Signed)
Patient is currently taking metformin 750 mg twice a day but its giving her diarrhea and she wants to go back to once a day.

## 2018-05-27 NOTE — Telephone Encounter (Signed)
Prescription sent electronically to pharmacy. Patient notified. 

## 2018-05-27 NOTE — Telephone Encounter (Addendum)
Patient seen 04/30/18 and was advised to follow up in 6 months

## 2018-05-27 NOTE — Telephone Encounter (Signed)
Patient stated she would be willing to try it and see how it does- She would like it sent to [facility] in [facility]

## 2018-05-27 NOTE — Telephone Encounter (Signed)
Ok metf 500 bid six mo worth

## 2018-05-29 ENCOUNTER — Telehealth: Payer: Self-pay | Admitting: Family Medicine

## 2018-05-29 MED ORDER — METFORMIN HCL ER 500 MG PO TB24: 1000.0000 mg | ORAL_TABLET | Freq: Every day | ORAL | 5 refills | Status: DC

## 2018-05-29 NOTE — Telephone Encounter (Signed)
Either 2 500xrpreferable if they have) or one 1000xrs qd

## 2018-05-29 NOTE — Telephone Encounter (Signed)
Pt States her metFORMIN (GLUCOPHAGE) 500 MG tablet that was called in needs to be extended release, advise.     Pharmacy:  [facility] [address], [place] - [address]

## 2018-05-29 NOTE — Telephone Encounter (Signed)
Patient was on Metformin 750mg XR 2 qd but was unable to tolerate and switched to Metformin 500mg  BID

## 2018-05-29 NOTE — Telephone Encounter (Signed)
Patient notified

## 2018-05-29 NOTE — Telephone Encounter (Signed)
Prescription sent electronically to pharmacy. Left message to return call to notify patient. 

## 2018-06-27 DIAGNOSIS — E559 Vitamin D deficiency, unspecified: Secondary | ICD-10-CM | POA: Diagnosis not present

## 2018-06-27 DIAGNOSIS — Z719 Counseling, unspecified: Secondary | ICD-10-CM | POA: Diagnosis not present

## 2018-06-27 DIAGNOSIS — D509 Iron deficiency anemia, unspecified: Secondary | ICD-10-CM | POA: Diagnosis not present

## 2018-06-27 DIAGNOSIS — E119 Type 2 diabetes mellitus without complications: Secondary | ICD-10-CM | POA: Diagnosis not present

## 2018-06-27 DIAGNOSIS — Z1389 Encounter for screening for other disorder: Secondary | ICD-10-CM | POA: Diagnosis not present

## 2018-06-27 DIAGNOSIS — Z008 Encounter for other general examination: Secondary | ICD-10-CM | POA: Diagnosis not present

## 2018-07-04 DIAGNOSIS — D509 Iron deficiency anemia, unspecified: Secondary | ICD-10-CM | POA: Diagnosis not present

## 2018-07-04 DIAGNOSIS — E559 Vitamin D deficiency, unspecified: Secondary | ICD-10-CM | POA: Diagnosis not present

## 2018-07-04 DIAGNOSIS — E119 Type 2 diabetes mellitus without complications: Secondary | ICD-10-CM | POA: Diagnosis not present

## 2018-08-03 ENCOUNTER — Other Ambulatory Visit: Payer: Self-pay | Admitting: Family Medicine

## 2018-08-20 ENCOUNTER — Ambulatory Visit: Payer: BLUE CROSS/BLUE SHIELD | Admitting: Family Medicine

## 2018-08-20 ENCOUNTER — Encounter: Payer: Self-pay | Admitting: Family Medicine

## 2018-08-20 VITALS — BP 132/82 | Temp 98.5°F | Wt 295.2 lb

## 2018-08-20 DIAGNOSIS — S39012A Strain of muscle, fascia and tendon of lower back, initial encounter: Secondary | ICD-10-CM

## 2018-08-20 DIAGNOSIS — M25572 Pain in left ankle and joints of left foot: Secondary | ICD-10-CM | POA: Diagnosis not present

## 2018-08-20 DIAGNOSIS — M545 Low back pain: Secondary | ICD-10-CM

## 2018-08-20 DIAGNOSIS — I10 Essential (primary) hypertension: Secondary | ICD-10-CM

## 2018-08-20 DIAGNOSIS — E119 Type 2 diabetes mellitus without complications: Secondary | ICD-10-CM

## 2018-08-20 MED ORDER — DULAGLUTIDE 0.75 MG/0.5ML ~~LOC~~ SOAJ: SUBCUTANEOUS | 5 refills | Status: DC

## 2018-08-20 MED ORDER — GABAPENTIN 100 MG PO CAPS: 100.0000 mg | ORAL_CAPSULE | Freq: Two times a day (BID) | ORAL | 1 refills | Status: DC

## 2018-08-20 MED ORDER — TRAMADOL HCL 50 MG PO TABS: 50.0000 mg | ORAL_TABLET | Freq: Two times a day (BID) | ORAL | 0 refills | Status: DC

## 2018-08-20 NOTE — Progress Notes (Signed)
Subjective:    Patient ID: Tracey Turner, female    DOB: 1956-02-05, 63 y.o.   MRN: [id number]  Back Pain  This is a chronic problem. The current episode started 1 to 4 weeks ago. Radiates to: left ankle. The symptoms are aggravated by bending. Treatments tried: muscle relaxers and pain pills; pain patches help more that pills. The treatment provided mild relief.    Back pain for ovdr a week  Had been given lidocaine patches in the past, helps some   Pain worwe in the left lumbar region  Some radiation in tohe leg   Patient claims compliance with diabetes medication. No obvious side effects. Reports no substantial low sugar spells. Most numbers are generally in good range when checked fasting. Generally does not miss a dose of medication. Watching diabetic diet closely  Morn numb bwtween 100 and 120 mostly pretty good, soe a little higher  Blood pressure medicine and blood pressure levels reviewed today with patient. Compliant with blood pressure medicine. States does not miss a dose. No obvious side effects. Blood pressure generally good when checked elsewhere. Watching salt intake.    Also has ankle pain, with hx of spurs   Review of Systems  Musculoskeletal: Positive for back pain.       Objective:   Physical Exam Alert and oriented, vitals reviewed and stable, NAD ENT-TM's and ext canals WNL bilat via otoscopic exam Soft palate, tonsils and post pharynx WNL via oropharyngeal exam Neck-symmetric, no masses; thyroid nonpalpable and nontender Pulmonary-no tachypnea or accessory muscle use; Clear without wheezes via auscultation Card--no abnrml murmurs, rhythm reg and rate WNL Carotid pulses symmetric, without bruits Left lumbar region tender to palpation.  Negative true straight leg raise.  Negative sciatic notch tenderness no spinal tenderness no CVA tenderness  Left ankle nonspecific    Assessment & Plan:  Impression back pain left lumbar strain.  Highly doubt  sciatica rationale discussed.  Local measures discussed.  Vacations discussed no x-rays no scans will not help discussed  2.  Nonspecific ankle pain  3.  Diabetes discussed/numbers overall good/diet discussed compliance with medications discussed  4.  Hypertension good control discussed to maintain same  Pain medication prescribed local measures discussed anti-inflammatory prescribed/follow-up regular appointment

## 2018-08-28 ENCOUNTER — Encounter: Payer: Self-pay | Admitting: Gastroenterology

## 2018-09-16 ENCOUNTER — Other Ambulatory Visit: Payer: Self-pay | Admitting: Family Medicine

## 2018-09-21 ENCOUNTER — Other Ambulatory Visit: Payer: Self-pay | Admitting: Family Medicine

## 2018-09-26 ENCOUNTER — Other Ambulatory Visit: Payer: Self-pay | Admitting: Family Medicine

## 2018-09-27 ENCOUNTER — Other Ambulatory Visit: Payer: Self-pay | Admitting: *Deleted

## 2018-09-27 ENCOUNTER — Telehealth: Payer: Self-pay | Admitting: Family Medicine

## 2018-09-27 MED ORDER — AMLODIPINE BESYLATE 5 MG PO TABS: 5.0000 mg | ORAL_TABLET | Freq: Every day | ORAL | 4 refills | Status: DC

## 2018-09-27 MED ORDER — AMLODIPINE BESYLATE 5 MG PO TABS: 5.0000 mg | ORAL_TABLET | Freq: Every day | ORAL | 0 refills | Status: DC

## 2018-09-27 NOTE — Telephone Encounter (Signed)
Pt notified and she also wants 30 day supply instead of 90 day so I resent again for 30 days.

## 2018-09-27 NOTE — Telephone Encounter (Signed)
Patient said that [facility] said they did not get the amLODipine (NORVASC) 5 MG tablet that was sent on 3/31 and would like it resent.

## 2018-09-27 NOTE — Telephone Encounter (Signed)
Prescription sent electronically to pharmacy  Left message to return call 

## 2018-10-18 ENCOUNTER — Other Ambulatory Visit: Payer: Self-pay

## 2018-10-18 ENCOUNTER — Other Ambulatory Visit: Payer: Self-pay | Admitting: Family Medicine

## 2018-10-18 MED ORDER — METOPROLOL TARTRATE 50 MG PO TABS: 50.0000 mg | ORAL_TABLET | Freq: Two times a day (BID) | ORAL | 1 refills | Status: DC

## 2018-10-18 MED ORDER — GLIPIZIDE 5 MG PO TABS: 5.0000 mg | ORAL_TABLET | Freq: Two times a day (BID) | ORAL | 1 refills | Status: DC

## 2018-10-29 ENCOUNTER — Ambulatory Visit: Payer: BLUE CROSS/BLUE SHIELD | Admitting: Family Medicine

## 2018-11-04 DIAGNOSIS — H40053 Ocular hypertension, bilateral: Secondary | ICD-10-CM | POA: Diagnosis not present

## 2018-11-04 LAB — HM DIABETES EYE EXAM

## 2018-11-05 ENCOUNTER — Encounter: Payer: Self-pay | Admitting: Family Medicine

## 2018-11-14 ENCOUNTER — Ambulatory Visit: Payer: BLUE CROSS/BLUE SHIELD | Admitting: Family Medicine

## 2018-12-03 DIAGNOSIS — Z029 Encounter for administrative examinations, unspecified: Secondary | ICD-10-CM

## 2018-12-06 ENCOUNTER — Other Ambulatory Visit: Payer: Self-pay | Admitting: Family Medicine

## 2018-12-06 ENCOUNTER — Ambulatory Visit: Payer: BC Managed Care – PPO | Admitting: Family Medicine

## 2018-12-06 ENCOUNTER — Other Ambulatory Visit: Payer: Self-pay

## 2018-12-06 ENCOUNTER — Encounter: Payer: Self-pay | Admitting: Family Medicine

## 2018-12-06 VITALS — BP 130/82 | Temp 97.7°F | Ht 66.0 in | Wt 295.0 lb

## 2018-12-06 DIAGNOSIS — I10 Essential (primary) hypertension: Secondary | ICD-10-CM | POA: Diagnosis not present

## 2018-12-06 DIAGNOSIS — E119 Type 2 diabetes mellitus without complications: Secondary | ICD-10-CM

## 2018-12-06 DIAGNOSIS — M545 Low back pain, unspecified: Secondary | ICD-10-CM

## 2018-12-06 DIAGNOSIS — S39012A Strain of muscle, fascia and tendon of lower back, initial encounter: Secondary | ICD-10-CM

## 2018-12-06 DIAGNOSIS — G8929 Other chronic pain: Secondary | ICD-10-CM

## 2018-12-06 LAB — POCT GLYCOSYLATED HEMOGLOBIN (HGB A1C): Hemoglobin A1C: 7.2 % — AB (ref 4.0–5.6)

## 2018-12-06 MED ORDER — TRIAMTERENE-HCTZ 37.5-25 MG PO CAPS: ORAL_CAPSULE | ORAL | 1 refills | Status: DC

## 2018-12-06 MED ORDER — CYCLOBENZAPRINE HCL 10 MG PO TABS: 10.0000 mg | ORAL_TABLET | Freq: Three times a day (TID) | ORAL | 0 refills | Status: DC | PRN

## 2018-12-06 MED ORDER — TRAMADOL HCL 50 MG PO TABS: 50.0000 mg | ORAL_TABLET | Freq: Two times a day (BID) | ORAL | 0 refills | Status: DC

## 2018-12-06 MED ORDER — METFORMIN HCL 500 MG PO TABS: 500.0000 mg | ORAL_TABLET | Freq: Two times a day (BID) | ORAL | 5 refills | Status: DC

## 2018-12-06 MED ORDER — TRULICITY 0.75 MG/0.5ML ~~LOC~~ SOAJ: SUBCUTANEOUS | 5 refills | Status: DC

## 2018-12-06 MED ORDER — GLIPIZIDE 5 MG PO TABS: 5.0000 mg | ORAL_TABLET | Freq: Two times a day (BID) | ORAL | 1 refills | Status: DC

## 2018-12-06 MED ORDER — AMLODIPINE BESYLATE 5 MG PO TABS: 5.0000 mg | ORAL_TABLET | Freq: Every day | ORAL | 5 refills | Status: DC

## 2018-12-06 MED ORDER — METOPROLOL TARTRATE 50 MG PO TABS: 50.0000 mg | ORAL_TABLET | Freq: Two times a day (BID) | ORAL | 1 refills | Status: DC

## 2018-12-06 NOTE — Progress Notes (Signed)
Subjective:    Patient ID: Tracey Turner, female    DOB: 21-Jul-1955, 63 y.o.   MRN: [id number]  Diabetes She presents for her follow-up diabetic visit. She has type 2 diabetes mellitus. Risk factors for coronary artery disease include diabetes mellitus, hypertension, obesity and post-menopausal. Current diabetic treatment includes oral agent (triple therapy). She is compliant with treatment all of the time. Her weight is stable. She is following a diabetic diet.   Patient also has FMLA forms that need to be completed  Results for orders placed or performed in visit on 12/06/18  POCT glycosylated hemoglobin (Hb A1C)  Result Value Ref Range   Hemoglobin A1C 7.2 (A) 4.0 - 5.6 %   HbA1c POC (<> result, manual entry)     HbA1c, POC (prediabetic range)     HbA1c, POC (controlled diabetic range)     Blood pressure medicine and blood pressure levels reviewed today with patient. Compliant with blood pressure medicine. States does not miss a dose. No obvious side effects. Blood pressure generally good when checked elsewhere. Watching salt intake.   Patient claims compliance with diabetes medication. No obvious side effects. Reports no substantial low sugar spells. Most numbers are generally in good range when checked fasting. Generally does not miss a dose of medication. Watching diabetic diet closely  This morn 122  Not exrcing much   Not dieting muuch    Back pain substantial. Flex and  Tramadol has helped sig in the past. lidoerm patches also help sig.  Would like to have all 3 of these agents on hand.  At times flares up and cause fairly substantial pain.  Review of Systems No headache, no major weight loss or weight gain, no chest pain no back pain abdominal pain no change in bowel habits complete ROS otherwise negative     Objective:   Physical Exam  Alert and oriented, vitals reviewed and stable, NAD ENT-TM's and ext canals WNL bilat via otoscopic exam Soft palate, tonsils and  post pharynx WNL via oropharyngeal exam Neck-symmetric, no masses; thyroid nonpalpable and nontender Pulmonary-no tachypnea or accessory muscle use; Clear without wheezes via auscultation Card--no abnrml murmurs, rhythm reg and rate WNL Carotid pulses symmetric, without bruits       Assessment & Plan:  Impression 1 type 2 diabetes.  A1c improving discussed reassured maintain same therapy changed to twice daily Metformin because long-acting out off the market  2.  Hypertension good control discussed blood pressure generally.  Maintain same  3.  Chronic low back pain intermittent in nature experiencing a flare medications refilled  Blood work reviewed.  Cholesterol panel awesome recheck in 6 months

## 2018-12-11 ENCOUNTER — Other Ambulatory Visit: Payer: Self-pay | Admitting: Family Medicine

## 2018-12-11 NOTE — Telephone Encounter (Signed)
Pt states the metformin that was called in on Friday 6/19 is the wrong kind. It makes her stomach upset and needs the metformin XR called in.   [facility] [place], [address]

## 2018-12-14 NOTE — Telephone Encounter (Signed)
Patient needs extended release metformin 500 mg twice a day please send this in please also notify patient that some of the extended release metformin has been recalled so it may not be available not much solution we can do about that

## 2018-12-16 ENCOUNTER — Ambulatory Visit: Payer: BLUE CROSS/BLUE SHIELD | Admitting: Family Medicine

## 2018-12-17 NOTE — Telephone Encounter (Signed)
Discussed with pt. Pt verbalized understanding. Refills sent in.

## 2018-12-27 ENCOUNTER — Other Ambulatory Visit: Payer: Self-pay

## 2019-01-01 ENCOUNTER — Other Ambulatory Visit: Payer: Self-pay | Admitting: Nurse Practitioner

## 2019-01-03 ENCOUNTER — Ambulatory Visit (INDEPENDENT_AMBULATORY_CARE_PROVIDER_SITE_OTHER): Payer: BC Managed Care – PPO | Admitting: Family Medicine

## 2019-01-03 ENCOUNTER — Encounter: Payer: Self-pay | Admitting: Family Medicine

## 2019-01-03 ENCOUNTER — Other Ambulatory Visit: Payer: Self-pay

## 2019-01-03 VITALS — BP 136/84 | Temp 97.7°F | Wt 303.4 lb

## 2019-01-03 DIAGNOSIS — I10 Essential (primary) hypertension: Secondary | ICD-10-CM

## 2019-01-03 DIAGNOSIS — M1711 Unilateral primary osteoarthritis, right knee: Secondary | ICD-10-CM

## 2019-01-03 NOTE — Progress Notes (Signed)
Subjective:    Patient ID: Tracey Turner, female    DOB: 1955-11-22, 63 y.o.   MRN: [id number]  Knee Pain  Incident onset: pt has had knee pain for a long time. The pain is present in the right knee. She has tried ice (knee/leg exercises ) for the symptoms. The treatment provided mild relief.  pt states she would like to a shot in her knee.  Patient has had progressive difficulty with arthritis pain.  Known history of arthritis longstanding.  Worsening over the past year.  Oral over-the-counter medications not helping.  Patient does not desire orthopedic referral and potential substantial interventions at this time  Blood pressure medicine and blood pressure levels reviewed today with patient. Compliant with blood pressure medicine. States does not miss a dose. No obvious side effects. Blood pressure generally good when checked elsewhere. Watching salt intake.    Review of Systems No headache, no major weight loss or weight gain, no chest pain no back pain abdominal pain no change in bowel habits complete ROS otherwise negative     Objective:   Physical Exam  Alert vitals stable, NAD. Blood pressure good on repeat. HEENT normal. Lungs clear. Heart regular rate and rhythm.  Right knee positive effusion noted positive crepitations patient was prepped draped anesthetized injected with 1 cc steroids and 2 cc Xylocaine      Assessment & Plan:  Impression hypertension.  Good control discussed to maintain same therapy  2.  Arthritis of knee.  Local measures discussed.  Exercise discussed over-the-counter agents discussed injection performed

## 2019-01-18 ENCOUNTER — Encounter: Payer: Self-pay | Admitting: Family Medicine

## 2019-01-18 MED ORDER — METHYLPREDNISOLONE ACETATE 40 MG/ML IJ SUSP: 40.0000 mg | Freq: Once | INTRAMUSCULAR | Status: DC

## 2019-02-08 ENCOUNTER — Other Ambulatory Visit: Payer: Self-pay | Admitting: Family Medicine

## 2019-02-18 ENCOUNTER — Other Ambulatory Visit: Payer: Self-pay

## 2019-02-18 MED ORDER — AMLODIPINE BESYLATE 5 MG PO TABS: 5.0000 mg | ORAL_TABLET | Freq: Every day | ORAL | 5 refills | Status: DC

## 2019-02-25 ENCOUNTER — Other Ambulatory Visit: Payer: Self-pay | Admitting: Family Medicine

## 2019-03-06 DIAGNOSIS — Z139 Encounter for screening, unspecified: Secondary | ICD-10-CM | POA: Diagnosis not present

## 2019-03-06 DIAGNOSIS — D509 Iron deficiency anemia, unspecified: Secondary | ICD-10-CM | POA: Diagnosis not present

## 2019-03-06 DIAGNOSIS — Z0130 Encounter for examination of blood pressure without abnormal findings: Secondary | ICD-10-CM | POA: Diagnosis not present

## 2019-03-06 DIAGNOSIS — E559 Vitamin D deficiency, unspecified: Secondary | ICD-10-CM | POA: Diagnosis not present

## 2019-03-06 DIAGNOSIS — E119 Type 2 diabetes mellitus without complications: Secondary | ICD-10-CM | POA: Diagnosis not present

## 2019-03-06 DIAGNOSIS — Z79899 Other long term (current) drug therapy: Secondary | ICD-10-CM | POA: Diagnosis not present

## 2019-03-20 DIAGNOSIS — J309 Allergic rhinitis, unspecified: Secondary | ICD-10-CM | POA: Diagnosis not present

## 2019-03-20 DIAGNOSIS — K219 Gastro-esophageal reflux disease without esophagitis: Secondary | ICD-10-CM | POA: Diagnosis not present

## 2019-03-20 DIAGNOSIS — I10 Essential (primary) hypertension: Secondary | ICD-10-CM | POA: Diagnosis not present

## 2019-03-29 ENCOUNTER — Other Ambulatory Visit: Payer: Self-pay | Admitting: Family Medicine

## 2019-05-06 DIAGNOSIS — H40053 Ocular hypertension, bilateral: Secondary | ICD-10-CM | POA: Diagnosis not present

## 2019-05-06 LAB — HM DIABETES EYE EXAM

## 2019-05-07 ENCOUNTER — Telehealth: Payer: Self-pay | Admitting: Family Medicine

## 2019-05-07 NOTE — Telephone Encounter (Signed)
She can not tolerate Metformin (XR)  Took it last Friday and it made her sick and she had a lot of swelling from it.  She thought about going to the ER because the swelling was so bad but she was afraid of Covid. Hasn't taken it since Friday and she has gotten better. She has a lot of problems with her medications.  She wants to know if there is something different she can take.  She works third shift so is going to bed now but will be awake after 3:30.  [facility], [place].

## 2019-05-07 NOTE — Telephone Encounter (Signed)
Last diabetic visit 12/06/18

## 2019-05-07 NOTE — Telephone Encounter (Signed)
Stop metformin, call us in one week with fasting sugars, we will likely need to incr her glipizide but need to see whr her numbers are first

## 2019-05-07 NOTE — Telephone Encounter (Signed)
Pt contacted and verbalized understanding. Pt will call with number in one week.

## 2019-05-16 ENCOUNTER — Telehealth: Payer: Self-pay | Admitting: Family Medicine

## 2019-05-16 NOTE — Telephone Encounter (Signed)
Good cst and we wil see on 12 18 as sched

## 2019-05-16 NOTE — Telephone Encounter (Signed)
Pt calling to report her blood sugar readings have been 74-123 through out the week except for one reading which was 163 due to eating fish that day.

## 2019-05-16 NOTE — Telephone Encounter (Signed)
Pt was informed to stop Metformin and give [place] an update in one week to see how fasting sugars were. Please advise. Thank you

## 2019-05-16 NOTE — Telephone Encounter (Signed)
Pt contacted and verbalized understanding.  

## 2019-05-25 ENCOUNTER — Other Ambulatory Visit: Payer: Self-pay | Admitting: Family Medicine

## 2019-06-06 ENCOUNTER — Other Ambulatory Visit: Payer: Self-pay

## 2019-06-06 ENCOUNTER — Ambulatory Visit (INDEPENDENT_AMBULATORY_CARE_PROVIDER_SITE_OTHER): Payer: BC Managed Care – PPO | Admitting: Family Medicine

## 2019-06-06 ENCOUNTER — Ambulatory Visit: Payer: BC Managed Care – PPO | Admitting: Family Medicine

## 2019-06-06 DIAGNOSIS — E119 Type 2 diabetes mellitus without complications: Secondary | ICD-10-CM

## 2019-06-06 DIAGNOSIS — M1711 Unilateral primary osteoarthritis, right knee: Secondary | ICD-10-CM | POA: Diagnosis not present

## 2019-06-06 DIAGNOSIS — I10 Essential (primary) hypertension: Secondary | ICD-10-CM

## 2019-06-06 MED ORDER — AMLODIPINE BESYLATE 5 MG PO TABS: 5.0000 mg | ORAL_TABLET | Freq: Every day | ORAL | 1 refills | Status: DC

## 2019-06-06 MED ORDER — METOPROLOL TARTRATE 50 MG PO TABS: 50.0000 mg | ORAL_TABLET | Freq: Two times a day (BID) | ORAL | 1 refills | Status: DC

## 2019-06-06 MED ORDER — GABAPENTIN 100 MG PO CAPS: 100.0000 mg | ORAL_CAPSULE | Freq: Two times a day (BID) | ORAL | 1 refills | Status: DC

## 2019-06-06 MED ORDER — TRAMADOL HCL 50 MG PO TABS: 50.0000 mg | ORAL_TABLET | Freq: Two times a day (BID) | ORAL | 5 refills | Status: DC

## 2019-06-06 MED ORDER — LIDOCAINE 5 % EX PTCH: MEDICATED_PATCH | CUTANEOUS | 5 refills | Status: DC

## 2019-06-06 MED ORDER — GLIPIZIDE 5 MG PO TABS: 5.0000 mg | ORAL_TABLET | Freq: Two times a day (BID) | ORAL | 1 refills | Status: DC

## 2019-06-06 MED ORDER — TRIAMTERENE-HCTZ 37.5-25 MG PO CAPS: ORAL_CAPSULE | ORAL | 1 refills | Status: DC

## 2019-06-06 NOTE — Progress Notes (Signed)
Subjective:    Patient ID: Tracey Turner, female    DOB: February 21, 1956, 63 y.o.   MRN: [id number]  Diabetes She presents for her follow-up diabetic visit. She has type 2 diabetes mellitus. She is compliant with treatment all of the time. Home blood sugar record trend: 71 this morning. has been good. Eye exam is current (about 2 months).   Concerns about arthritis pain. Tylenol not helping much with pain.   Virtual Visit via Telephone Note  I connected with AIRYANA HARGUS on 06/06/19 at  2:00 PM EST by telephone and verified that I am speaking with the correct person using two identifiers.  Location: Patient: home Provider: office   I discussed the limitations, risks, security and privacy concerns of performing an evaluation and management service by telephone and the availability of in person appointments. I also discussed with the patient that there may be a patient responsible charge related to this service. The patient expressed understanding and agreed to proceed.   History of Present Illness:    Observations/Objective:   Assessment and Plan:   Follow Up Instructions:    I discussed the assessment and treatment plan with the patient. The patient was provided an opportunity to ask questions and all were answered. The patient agreed with the plan and demonstrated an understanding of the instructions.   The patient was advised to call back or seek an in-person evaluation if the symptoms worsen or if the condition fails to improve as anticipated.  I provided 25 minutes of non-face-to-face time during this encounter.  Patient claims compliance with diabetes medication. No obvious side effects. Reports no substantial low sugar spells. Most numbers are generally in good range when checked fasting. Generally does not miss a dose of medication. Watching diabetic diet closely Last A1c 8.0% per workplace  Blood pressure medicine and blood pressure levels reviewed today with patient.  Compliant with blood pressure medicine. States does not miss a dose. No obvious side effects. Blood pressure generally good when checked elsewhere. Watching salt intake.  Patient notes her arthritis continues to worsen.  States topical agents helping some.  Would like a refill on the Lidoderm.  But feels she needs something stronger by mouth to take on occasion also.      Review of Systems No headache, no major weight loss or weight gain, no chest pain no back pain abdominal pain no change in bowel habits complete ROS otherwise negative     Objective:   Physical Exam  Virtual      Assessment & Plan:  Impression 1 type 2 diabetes.  Suboptimal control.  8.0%.  Compliance with medications discussed.  Diet encouraged  2.  Hypertension.  Apparent good control discussed to maintain same meds  3.  Worsening arthritis.  Add Ultram.  May use up to 1 twice daily.  Also use Lidoderm patch  Follow-up in 6 months diet exercise discussed

## 2019-06-08 ENCOUNTER — Encounter: Payer: Self-pay | Admitting: Family Medicine

## 2019-06-09 ENCOUNTER — Telehealth: Payer: Self-pay | Admitting: Family Medicine

## 2019-06-09 ENCOUNTER — Other Ambulatory Visit: Payer: Self-pay | Admitting: *Deleted

## 2019-06-09 MED ORDER — CYCLOBENZAPRINE HCL 10 MG PO TABS: 10.0000 mg | ORAL_TABLET | Freq: Three times a day (TID) | ORAL | 11 refills | Status: DC | PRN

## 2019-06-09 NOTE — Telephone Encounter (Signed)
Called pt and she states she only got a 7 day supply of tramadol. She states she has been on this med for a long time and they did this to her in the past and then the next refill they gave her 62 and now they gave her 14 again. I called pharm and they said it was due to insurance. Insurance only allows an initial 7 day supply and after that she can get the full amount. I told them she has been on the med and they said she had not filled in over 90 days and it starts over every 90 days. I called and explained that to pt and she verbalized understanding.

## 2019-06-09 NOTE — Telephone Encounter (Signed)
She also wanted to get refill on flexeril. States she talked to dr Richardson Landry at the visit and it was for her arthritis.

## 2019-06-09 NOTE — Telephone Encounter (Signed)
Refills sent and pt notified.  

## 2019-06-09 NOTE — Telephone Encounter (Signed)
Patient had phone visit on 12/18 and requested refill on tramadol 50 mg the pharmacist only gave her fourteen pills and also check on prescription Flexall for muscle spasms called into [facility]

## 2019-06-09 NOTE — Telephone Encounter (Signed)
Refill flex as before plus 11 refills

## 2019-07-24 ENCOUNTER — Encounter: Payer: Self-pay | Admitting: Family Medicine

## 2019-09-04 ENCOUNTER — Encounter: Payer: Self-pay | Admitting: Family Medicine

## 2019-11-10 DIAGNOSIS — H40053 Ocular hypertension, bilateral: Secondary | ICD-10-CM | POA: Diagnosis not present

## 2019-11-10 LAB — HM DIABETES EYE EXAM

## 2019-11-12 ENCOUNTER — Other Ambulatory Visit: Payer: Self-pay | Admitting: *Deleted

## 2019-11-12 ENCOUNTER — Telehealth: Payer: Self-pay | Admitting: *Deleted

## 2019-11-12 DIAGNOSIS — Z13220 Encounter for screening for lipoid disorders: Secondary | ICD-10-CM

## 2019-11-12 DIAGNOSIS — D649 Anemia, unspecified: Secondary | ICD-10-CM

## 2019-11-12 DIAGNOSIS — Z79899 Other long term (current) drug therapy: Secondary | ICD-10-CM

## 2019-11-12 DIAGNOSIS — E119 Type 2 diabetes mellitus without complications: Secondary | ICD-10-CM

## 2019-11-12 DIAGNOSIS — I10 Essential (primary) hypertension: Secondary | ICD-10-CM

## 2019-11-12 NOTE — Telephone Encounter (Signed)
Patient has check up scheduled 12/05/19  Last labs 11/2018- HgbA1c

## 2019-11-12 NOTE — Telephone Encounter (Signed)
Blood work ordered in Epic. Left message to return call to notify patient. 

## 2019-11-12 NOTE — Telephone Encounter (Signed)
Patient called back and states she gets her blood work done at her work --orders were mailed to pt.

## 2019-11-12 NOTE — Telephone Encounter (Signed)
Pls order cbc, cmp, lipid panel, a1c, urine microalbumin.  Fasting labs- and have pt complete about 3-7 days prior to appt.   Thx.   Dr. Ladona Ridgel

## 2019-11-25 ENCOUNTER — Encounter: Payer: Self-pay | Admitting: Gastroenterology

## 2019-12-04 DIAGNOSIS — Z029 Encounter for administrative examinations, unspecified: Secondary | ICD-10-CM

## 2019-12-05 ENCOUNTER — Other Ambulatory Visit: Payer: Self-pay

## 2019-12-05 ENCOUNTER — Encounter: Payer: Self-pay | Admitting: Family Medicine

## 2019-12-05 ENCOUNTER — Ambulatory Visit: Payer: BC Managed Care – PPO | Admitting: Family Medicine

## 2019-12-05 ENCOUNTER — Telehealth: Payer: Self-pay | Admitting: Family Medicine

## 2019-12-05 VITALS — BP 130/86 | HR 64 | Temp 97.2°F | Ht 66.0 in | Wt 299.2 lb

## 2019-12-05 DIAGNOSIS — H40053 Ocular hypertension, bilateral: Secondary | ICD-10-CM | POA: Diagnosis not present

## 2019-12-05 DIAGNOSIS — K219 Gastro-esophageal reflux disease without esophagitis: Secondary | ICD-10-CM | POA: Diagnosis not present

## 2019-12-05 DIAGNOSIS — E119 Type 2 diabetes mellitus without complications: Secondary | ICD-10-CM | POA: Diagnosis not present

## 2019-12-05 DIAGNOSIS — H40059 Ocular hypertension, unspecified eye: Secondary | ICD-10-CM | POA: Insufficient documentation

## 2019-12-05 DIAGNOSIS — I10 Essential (primary) hypertension: Secondary | ICD-10-CM | POA: Diagnosis not present

## 2019-12-05 LAB — POCT GLYCOSYLATED HEMOGLOBIN (HGB A1C): Hemoglobin A1C: 7.8 % — AB (ref 4.0–5.6)

## 2019-12-05 MED ORDER — GABAPENTIN 100 MG PO CAPS: 100.0000 mg | ORAL_CAPSULE | Freq: Two times a day (BID) | ORAL | 1 refills | Status: DC

## 2019-12-05 MED ORDER — TRAMADOL HCL 50 MG PO TABS: 50.0000 mg | ORAL_TABLET | Freq: Two times a day (BID) | ORAL | 5 refills | Status: DC

## 2019-12-05 MED ORDER — TRULICITY 0.75 MG/0.5ML ~~LOC~~ SOAJ: SUBCUTANEOUS | 5 refills | Status: DC

## 2019-12-05 MED ORDER — AMLODIPINE BESYLATE 5 MG PO TABS: 5.0000 mg | ORAL_TABLET | Freq: Every day | ORAL | 1 refills | Status: DC

## 2019-12-05 MED ORDER — METOPROLOL TARTRATE 50 MG PO TABS: 50.0000 mg | ORAL_TABLET | Freq: Two times a day (BID) | ORAL | 1 refills | Status: DC

## 2019-12-05 MED ORDER — TRIAMTERENE-HCTZ 37.5-25 MG PO CAPS: ORAL_CAPSULE | ORAL | 1 refills | Status: DC

## 2019-12-05 MED ORDER — GLIPIZIDE 5 MG PO TABS: 5.0000 mg | ORAL_TABLET | Freq: Two times a day (BID) | ORAL | 1 refills | Status: DC

## 2019-12-05 NOTE — Telephone Encounter (Signed)
Patient brought in FMLA to be updated for 2021. Papers are in your box along with copy of last year paper work.Tracey Turner

## 2019-12-05 NOTE — Progress Notes (Signed)
Subjective:    Patient ID: Tracey Turner, female    DOB: [birth year], 64 y.o.   MRN: [id number]  Hypertension This is a chronic problem. Pertinent negatives include no chest pain, headaches or shortness of breath. Risk factors for coronary artery disease include diabetes mellitus. There are no compliance problems.   Diabetes She presents for her follow-up diabetic visit. She has type 2 diabetes mellitus. There are no hypoglycemic associated symptoms. Pertinent negatives for hypoglycemia include no dizziness or headaches. There are no diabetic associated symptoms. Pertinent negatives for diabetes include no chest pain and no weakness. There are no hypoglycemic complications. There are no diabetic complications. Risk factors for coronary artery disease include hypertension. Compliance with diabetes treatment: checking sugars 2-3 times a week  She does not see a podiatrist.Eye exam is current.  seeing 150-170 in am fasting blood glucose.  Pt had labs done through her employer last Tuesday.  Last few times a1c was elevated.  Has h/o DM2- trulicity and glipizide. Couldn't tolerate metformin. Seeing eye doctor q 56mo for ocular htn bilateral eyes. No sores or ulcers on feet.  Right greater than left for leg swelling.  Taking dyazide daily for fluid. H/o diabetic neuropathy- taking gabapentin and helping. occ taking tramadol.   htn- doing well with meds.  No dizziness, headaches, blurry vision, cp, or sob.  Review of Systems  Constitutional: Negative for chills and fever.  HENT: Negative for congestion, rhinorrhea and sore throat.   Respiratory: Negative for cough, shortness of breath and wheezing.   Cardiovascular: Negative for chest pain and leg swelling.  Gastrointestinal: Negative for abdominal pain, diarrhea, nausea and vomiting.  Genitourinary: Negative for dysuria and frequency.  Musculoskeletal: Negative for arthralgias and back pain.  Skin: Negative for rash.  Neurological: Negative  for dizziness, weakness and headaches.       Vitals:   12/05/19 1343  BP: 130/86  Pulse: 64  Temp: (!) 97.2 F (36.2 C)  SpO2: 96%   Objective:   Physical Exam Vitals and nursing note reviewed.  Constitutional:      General: She is not in acute distress.    Appearance: Normal appearance. She is not ill-appearing.  HENT:     Head: Normocephalic and atraumatic.     Nose: Nose normal.     Mouth/Throat:     Mouth: Mucous membranes are moist.     Pharynx: Oropharynx is clear.  Eyes:     Extraocular Movements: Extraocular movements intact.     Conjunctiva/sclera: Conjunctivae normal.     Pupils: Pupils are equal, round, and reactive to light.  Cardiovascular:     Rate and Rhythm: Normal rate and regular rhythm.     Pulses: Normal pulses.     Heart sounds: Normal heart sounds. No murmur heard.   Pulmonary:     Effort: Pulmonary effort is normal.     Breath sounds: Normal breath sounds. No wheezing, rhonchi or rales.  Musculoskeletal:        General: Normal range of motion.     Right lower leg: Edema (1 pitting) present.     Left lower leg: No edema.  Skin:    General: Skin is warm and dry.     Findings: No lesion or rash.  Neurological:     General: No focal deficit present.     Mental Status: She is alert and oriented to person, place, and time.     Cranial Nerves: No cranial nerve deficit.     Motor: No  weakness.     Gait: Gait normal.  Psychiatric:        Mood and Affect: Mood normal.        Behavior: Behavior normal.        Assessment & Plan:   1. Diabetes mellitus without complication (HCC) - POCT HgB A1C  2. Bilateral ocular hypertension  3. Essential hypertension  4. Gastroesophageal reflux disease without esophagitis  DM2- May need to inc the glipizide, pt on 5mg  glipizide bid.  cont the trulicity.  In office Hba1c- 7.8.  Last one in 2020- was 7.2.  Pt to get labs from her work she had last week.  Diabetic neuropathy- cont gabapentin  Ocular  htn- seeing ophthalmologist.  htn- stable.  Cont meds, metoprolol, dyazide, and amlodipine.  gerd-stable. Cont meds, pepcid.   Pt needing FMLA papers filled out-  For her yearly appts.  Pt seeing eye doctor 2x per yr for diabetes check and ocular htn. Seeing pcp 3-4x per yr.  Seeing Dr. Darrick Penna- GI- 2x per year.   F/u 63mo or prn.

## 2019-12-06 ENCOUNTER — Other Ambulatory Visit: Payer: Self-pay | Admitting: Family Medicine

## 2019-12-07 ENCOUNTER — Telehealth: Payer: Self-pay | Admitting: Family Medicine

## 2019-12-08 NOTE — Telephone Encounter (Signed)
Called pt and transferred her to the front to schedule a visit to review labs. She needs a refill on lidocaine patches. States she did not get at her visit. [facility] [place].

## 2019-12-08 NOTE — Telephone Encounter (Signed)
Left message to return call 

## 2019-12-08 NOTE — Telephone Encounter (Signed)
FMLA has been faxed and copy mailed to patient.

## 2019-12-08 NOTE — Telephone Encounter (Signed)
Hi we didn't need an appt to discuss the labs.  We just needed to get the results from her work, and I can call her with my recommendations.  Unless she needs to see me on 6/25, otherwise you all can cancel this.  Sent the lidocaine in.  Thx.  Dr. Karie Schwalbe

## 2019-12-08 NOTE — Telephone Encounter (Signed)
Done

## 2019-12-09 NOTE — Telephone Encounter (Signed)
Pt states she will have lab work sent for dr taylor to review and she cancelled 6/25 appt

## 2019-12-10 ENCOUNTER — Telehealth: Payer: Self-pay | Admitting: Family Medicine

## 2019-12-10 NOTE — Telephone Encounter (Signed)
Copy of Labs in folder for Dr Ladona Ridgel to review

## 2019-12-10 NOTE — Telephone Encounter (Signed)
error 

## 2019-12-12 ENCOUNTER — Ambulatory Visit: Payer: BC Managed Care – PPO | Admitting: Family Medicine

## 2019-12-12 NOTE — Telephone Encounter (Signed)
Patient notified

## 2019-12-12 NOTE — Telephone Encounter (Signed)
See note below. Thx. Dr. Ladona Ridgel

## 2019-12-12 NOTE — Telephone Encounter (Signed)
Reviewed labs, have pt continue current doses of medications.   Thx,   Dr. Ladona Ridgel

## 2019-12-12 NOTE — Telephone Encounter (Signed)
Lmtc

## 2019-12-14 ENCOUNTER — Telehealth: Payer: Self-pay | Admitting: Gastroenterology

## 2019-12-14 NOTE — Telephone Encounter (Signed)
Received labs ordered by Clovis Riley completed 11/25/19. Not entirely sure why labs were sent to our office. Per review, looks like labs were needing to be sent to PCP and may have just been sent to our office as well as she has labs completed at work. We have not seen patient since 2019. I have never seen patient. Last saw Dr. Darrick Penna in 2019 for chronic diarrhea, GERD, and IDA. She was doing well with recommendations to follow-up in 1-2 years.   Reviewed labs. CMP within normal limits other than elevated glucose at 165. CBC essentially normal: hemoglobin 11.8, MCV 87.1, MCH 27.6, MCHC 31.7.   No additional recommendations. Will have records scanned into system. Notably, PCP has also reviewed labs.

## 2020-01-27 ENCOUNTER — Other Ambulatory Visit: Payer: Self-pay | Admitting: Family Medicine

## 2020-01-30 ENCOUNTER — Ambulatory Visit (INDEPENDENT_AMBULATORY_CARE_PROVIDER_SITE_OTHER): Payer: BC Managed Care – PPO | Admitting: Nurse Practitioner

## 2020-01-30 ENCOUNTER — Other Ambulatory Visit: Payer: Self-pay

## 2020-01-30 VITALS — BP 142/76 | HR 81 | Temp 97.8°F | Ht 66.0 in | Wt 305.0 lb

## 2020-01-30 DIAGNOSIS — Z1231 Encounter for screening mammogram for malignant neoplasm of breast: Secondary | ICD-10-CM | POA: Diagnosis not present

## 2020-01-30 DIAGNOSIS — Z1239 Encounter for other screening for malignant neoplasm of breast: Secondary | ICD-10-CM

## 2020-01-30 DIAGNOSIS — Z23 Encounter for immunization: Secondary | ICD-10-CM | POA: Diagnosis not present

## 2020-01-30 DIAGNOSIS — M8580 Other specified disorders of bone density and structure, unspecified site: Secondary | ICD-10-CM

## 2020-01-30 DIAGNOSIS — Z124 Encounter for screening for malignant neoplasm of cervix: Secondary | ICD-10-CM | POA: Diagnosis not present

## 2020-01-30 DIAGNOSIS — Z0000 Encounter for general adult medical examination without abnormal findings: Secondary | ICD-10-CM

## 2020-01-30 DIAGNOSIS — Z01419 Encounter for gynecological examination (general) (routine) without abnormal findings: Secondary | ICD-10-CM

## 2020-01-30 DIAGNOSIS — I83893 Varicose veins of bilateral lower extremities with other complications: Secondary | ICD-10-CM

## 2020-01-30 DIAGNOSIS — Z780 Asymptomatic menopausal state: Secondary | ICD-10-CM

## 2020-01-30 DIAGNOSIS — Z1151 Encounter for screening for human papillomavirus (HPV): Secondary | ICD-10-CM | POA: Diagnosis not present

## 2020-01-30 DIAGNOSIS — Z122 Encounter for screening for malignant neoplasm of respiratory organs: Secondary | ICD-10-CM

## 2020-01-30 DIAGNOSIS — Z13820 Encounter for screening for osteoporosis: Secondary | ICD-10-CM

## 2020-01-30 NOTE — Progress Notes (Signed)
Subjective:    Patient ID: Tracey Turner, female    DOB: Jan 23, 1956, 64 y.o.   MRN: [id number]  HPI The patient comes in today for a wellness visit.    A review of their health history was completed.  A review of medications was also completed.  Any needed refills;   Eating habits: health conscious; decreased appetite but has gained some weight; increased intake of processed meats which she thinks has added to her swelling  Falls/  MVA accidents in past few months: none  Regular exercise: a little exercise but states she should do more  Specialist pt sees on regular basis: dr fields and  [facility]  Preventative health issues were discussed.   Additional concerns: none Regular dental care.  30 pack year smoker; quit in 2009   Review of Systems  Constitutional: Positive for appetite change and fatigue. Negative for activity change and fever.  HENT: Negative for trouble swallowing.   Respiratory: Negative for cough, chest tightness, shortness of breath and wheezing.   Cardiovascular: Positive for leg swelling. Negative for chest pain.  Gastrointestinal: Negative for abdominal distention, abdominal pain, blood in stool, constipation, diarrhea, nausea and vomiting.  Genitourinary: Negative for difficulty urinating, dysuria, enuresis, frequency, genital sores, menstrual problem, pelvic pain, urgency, vaginal bleeding and vaginal discharge.  Musculoskeletal: Positive for arthralgias and gait problem.  Has burning, pain and swelling in the lower legs from varicose veins No vaginal bleeding. No new sexual partners.   Depression screen [facility] 2/9 01/30/2020 12/05/2019 04/30/2017 04/27/2016  Decreased Interest 0 0 0 0  Down, Depressed, Hopeless 0 0 0 0  PHQ - 2 Score 0 0 0 0        Objective:   Physical Exam Constitutional:      General: She is not in acute distress.    Appearance: She is well-developed.  Neck:     Thyroid: No thyromegaly.     Trachea: No tracheal  deviation.     Comments: Thyroid non tender; no mass or goiter noted.  Cardiovascular:     Rate and Rhythm: Normal rate and regular rhythm.     Heart sounds: Normal heart sounds. No murmur heard.  No gallop.   Pulmonary:     Effort: Pulmonary effort is normal.     Breath sounds: Normal breath sounds.  Chest:     Breasts:        Right: No swelling, inverted nipple, mass, skin change or tenderness.        Left: No swelling, inverted nipple, mass, skin change or tenderness.  Abdominal:     General: There is no distension.     Palpations: Abdomen is soft.     Tenderness: There is no abdominal tenderness.  Genitourinary:    Vagina: Normal. No vaginal discharge.     Comments: External GU: no rashes, lesions or discoloration. Vagina: tissue light pink, no discharge. Cervix normal in appearance. No CMT. Bimanual exam: no tenderness or obvious masses; limited due to abd girth.  Musculoskeletal:     Cervical back: Normal range of motion and neck supple.  Lymphadenopathy:     Cervical: No cervical adenopathy.     Upper Body:     Right upper body: No supraclavicular, axillary or pectoral adenopathy.     Left upper body: No supraclavicular, axillary or pectoral adenopathy.  Skin:    General: Skin is warm and dry.     Findings: No rash.     Comments: Multiple varicosities of  different sizes noted along both lower legs. Venous stasis color changes noted on both lower pretibial areas bilat. 1-2 + pitting edema lower legs.   Neurological:     Mental Status: She is alert and oriented to person, place, and time.     Comments: Able to get on exam table but with some difficulty; ambulates slowly.   Psychiatric:        Mood and Affect: Mood normal.        Behavior: Behavior normal.        Thought Content: Thought content normal.        Judgment: Judgment normal.    Diabetic Foot Exam - Simple   Simple Foot Form Diabetic Foot exam was performed with the following findings: Yes 01/30/2020  3:30 PM    Visual Inspection No deformities, no ulcerations, no other skin breakdown bilaterally: Yes Sensation Testing Intact to touch and monofilament testing bilaterally: Yes Pulse Check See comments: Yes Comments DP pulses present bilaterally. Mild ankle edema so no PT pulse could be palpated. Toes cool with normal cap refill.     Labs done through work; see report.  Results for orders placed or performed in visit on 12/05/19  POCT HgB A1C  Result Value Ref Range   Hemoglobin A1C 7.8 (A) 4.0 - 5.6 %   HbA1c POC (<> result, manual entry)     HbA1c, POC (prediabetic range)     HbA1c, POC (controlled diabetic range)          Assessment & Plan:   Problem List Items Addressed This Visit      Cardiovascular and Mediastinum   Varicose veins of both legs with edema   Relevant Orders   Ambulatory referral to Vascular Surgery     Other   Morbid obesity (HCC)    Other Visit Diagnoses    Well woman exam    -  Primary   Relevant Orders   IGP, Aptima HPV   Need for vaccination       Relevant Orders   Pneumococcal conjugate vaccine 13-valent IM (Completed)   Post-menopausal       Relevant Orders   DG Bone Density   Encounter for screening for malignant neoplasm of breast, unspecified screening modality       Encounter for screening mammogram for breast cancer       Relevant Orders   MM DIGITAL SCREENING BILATERAL   Screening for cervical cancer       Relevant Orders   IGP, Aptima HPV   Screening for HPV (human papillomavirus)       Relevant Orders   IGP, Aptima HPV   Screening for osteoporosis       Relevant Orders   DG Bone Density   Encounter for screening for lung cancer       Relevant Orders   Ambulatory referral to Pulmonology     Prevnar 13 today. Recommend Shingrix at local pharmacy even though she has had Zostavax.  Recommend follow up with GI specialist for colonoscopy.  Discussed importance of weight loss. Set goal of 10 lbs over the next 3 months. Increase  activity as tolerated. Return in about 3 months (around 05/01/2020) for Diabetes check up.

## 2020-02-02 ENCOUNTER — Encounter: Payer: Self-pay | Admitting: Nurse Practitioner

## 2020-02-02 DIAGNOSIS — I83893 Varicose veins of bilateral lower extremities with other complications: Secondary | ICD-10-CM | POA: Insufficient documentation

## 2020-02-03 LAB — IGP, APTIMA HPV: HPV Aptima: NEGATIVE

## 2020-02-06 ENCOUNTER — Ambulatory Visit (HOSPITAL_COMMUNITY)
Admission: RE | Admit: 2020-02-06 | Discharge: 2020-02-06 | Disposition: A | Discharge status: Home / Self Care | Payer: BC Managed Care – PPO | Source: Ambulatory Visit | Attending: Nurse Practitioner | Admitting: Nurse Practitioner

## 2020-02-06 ENCOUNTER — Other Ambulatory Visit: Payer: Self-pay

## 2020-02-06 DIAGNOSIS — Z780 Asymptomatic menopausal state: Secondary | ICD-10-CM | POA: Diagnosis not present

## 2020-02-06 DIAGNOSIS — Z13820 Encounter for screening for osteoporosis: Secondary | ICD-10-CM | POA: Diagnosis not present

## 2020-02-09 ENCOUNTER — Ambulatory Visit (HOSPITAL_COMMUNITY)
Admission: RE | Admit: 2020-02-09 | Discharge: 2020-02-09 | Disposition: A | Discharge status: Home / Self Care | Payer: BC Managed Care – PPO | Source: Ambulatory Visit | Attending: Nurse Practitioner | Admitting: Nurse Practitioner

## 2020-02-09 ENCOUNTER — Other Ambulatory Visit: Payer: Self-pay

## 2020-02-09 DIAGNOSIS — Z1231 Encounter for screening mammogram for malignant neoplasm of breast: Secondary | ICD-10-CM | POA: Diagnosis not present

## 2020-02-10 ENCOUNTER — Encounter: Payer: Self-pay | Admitting: Family Medicine

## 2020-02-12 ENCOUNTER — Encounter (HOSPITAL_COMMUNITY): Payer: Self-pay

## 2020-02-12 NOTE — Progress Notes (Signed)
Patient referred to Lung Cancer Screening Program by Sherie Don, NP. I have attempted to reach the patient regarding referral. Unable to reach the patient at this time. I have left a detailed VM for the patient asking that she call me back to discuss program enrollment.

## 2020-02-23 ENCOUNTER — Other Ambulatory Visit: Payer: Self-pay | Admitting: Family Medicine

## 2020-02-26 ENCOUNTER — Encounter (HOSPITAL_COMMUNITY): Payer: Self-pay

## 2020-02-26 NOTE — Progress Notes (Signed)
Despite multiple attempts, I have been unable to reach this patient regarding Lung Cancer Screening Program. I will close this referral at this time, referring MD aware. I have left a detailed VM for this patient with my contact information should she decide to proceed with program enrollment.

## 2020-03-02 DIAGNOSIS — M79604 Pain in right leg: Secondary | ICD-10-CM | POA: Diagnosis not present

## 2020-03-02 DIAGNOSIS — M79605 Pain in left leg: Secondary | ICD-10-CM | POA: Diagnosis not present

## 2020-03-02 DIAGNOSIS — R600 Localized edema: Secondary | ICD-10-CM | POA: Diagnosis not present

## 2020-03-04 DIAGNOSIS — R600 Localized edema: Secondary | ICD-10-CM | POA: Diagnosis not present

## 2020-03-04 DIAGNOSIS — M79605 Pain in left leg: Secondary | ICD-10-CM | POA: Diagnosis not present

## 2020-03-30 ENCOUNTER — Encounter: Payer: Self-pay | Admitting: Family Medicine

## 2020-03-31 ENCOUNTER — Other Ambulatory Visit: Payer: Self-pay | Admitting: Nurse Practitioner

## 2020-04-02 ENCOUNTER — Other Ambulatory Visit: Payer: Self-pay | Admitting: Nurse Practitioner

## 2020-04-09 ENCOUNTER — Ambulatory Visit: Payer: BC Managed Care – PPO | Admitting: Family Medicine

## 2020-04-16 DIAGNOSIS — I8312 Varicose veins of left lower extremity with inflammation: Secondary | ICD-10-CM | POA: Diagnosis not present

## 2020-04-16 DIAGNOSIS — I8311 Varicose veins of right lower extremity with inflammation: Secondary | ICD-10-CM | POA: Diagnosis not present

## 2020-04-16 DIAGNOSIS — I890 Lymphedema, not elsewhere classified: Secondary | ICD-10-CM | POA: Diagnosis not present

## 2020-04-16 DIAGNOSIS — I83893 Varicose veins of bilateral lower extremities with other complications: Secondary | ICD-10-CM | POA: Diagnosis not present

## 2020-05-07 ENCOUNTER — Ambulatory Visit: Payer: BC Managed Care – PPO | Admitting: Family Medicine

## 2020-05-15 ENCOUNTER — Other Ambulatory Visit: Payer: Self-pay | Admitting: Family Medicine

## 2020-05-17 DIAGNOSIS — H40053 Ocular hypertension, bilateral: Secondary | ICD-10-CM | POA: Diagnosis not present

## 2020-05-17 LAB — HM DIABETES EYE EXAM

## 2020-06-04 DIAGNOSIS — I8312 Varicose veins of left lower extremity with inflammation: Secondary | ICD-10-CM | POA: Diagnosis not present

## 2020-06-04 DIAGNOSIS — I8311 Varicose veins of right lower extremity with inflammation: Secondary | ICD-10-CM | POA: Diagnosis not present

## 2020-06-08 ENCOUNTER — Encounter: Payer: Self-pay | Admitting: Family Medicine

## 2020-06-08 ENCOUNTER — Other Ambulatory Visit: Payer: Self-pay

## 2020-06-08 ENCOUNTER — Ambulatory Visit: Payer: BC Managed Care – PPO | Admitting: Family Medicine

## 2020-06-08 VITALS — BP 142/90 | HR 77 | Temp 97.6°F | Ht 66.0 in | Wt 308.0 lb

## 2020-06-08 DIAGNOSIS — I83893 Varicose veins of bilateral lower extremities with other complications: Secondary | ICD-10-CM | POA: Diagnosis not present

## 2020-06-08 DIAGNOSIS — M545 Low back pain, unspecified: Secondary | ICD-10-CM | POA: Diagnosis not present

## 2020-06-08 DIAGNOSIS — I10 Essential (primary) hypertension: Secondary | ICD-10-CM | POA: Diagnosis not present

## 2020-06-08 DIAGNOSIS — G8929 Other chronic pain: Secondary | ICD-10-CM

## 2020-06-08 DIAGNOSIS — E119 Type 2 diabetes mellitus without complications: Secondary | ICD-10-CM | POA: Diagnosis not present

## 2020-06-08 LAB — POCT GLYCOSYLATED HEMOGLOBIN (HGB A1C): Hemoglobin A1C: 7.1 % — AB (ref 4.0–5.6)

## 2020-06-08 MED ORDER — LOSARTAN POTASSIUM 25 MG PO TABS: 25.0000 mg | ORAL_TABLET | Freq: Every day | ORAL | 1 refills | Status: DC

## 2020-06-08 MED ORDER — TRAMADOL HCL 50 MG PO TABS: 50.0000 mg | ORAL_TABLET | Freq: Two times a day (BID) | ORAL | 2 refills | Status: DC

## 2020-06-08 MED ORDER — LIDOCAINE 5 % EX PTCH: MEDICATED_PATCH | CUTANEOUS | 0 refills | Status: DC

## 2020-06-08 MED ORDER — GABAPENTIN 100 MG PO CAPS: 100.0000 mg | ORAL_CAPSULE | Freq: Two times a day (BID) | ORAL | 1 refills | Status: DC

## 2020-06-08 MED ORDER — CYCLOBENZAPRINE HCL 10 MG PO TABS: 10.0000 mg | ORAL_TABLET | Freq: Three times a day (TID) | ORAL | 2 refills | Status: DC | PRN

## 2020-06-08 MED ORDER — METOPROLOL TARTRATE 50 MG PO TABS: 50.0000 mg | ORAL_TABLET | Freq: Two times a day (BID) | ORAL | 1 refills | Status: DC

## 2020-06-08 NOTE — Progress Notes (Signed)
Patient ID: Tracey Turner, female    DOB: 12/29/1955, 64 y.o.   MRN: [id number]   Chief Complaint  Patient presents with  . Diabetes  . Back Pain    chronic  . Leg Swelling    chronic   Subjective:    HPI  F/u diabetes check up. BG this morning 150. Pt states some mornings it is over 150 but mostly around 150.  pt brought in bloodwork  Pt feeling healthy, not had covid this year.  Pt has h/o back pain and ankle pain- Needs refill on flexeril and lidocaine patch.  Takes for back pain and pain in ankles.   htn- doing well with meds.  Would like to discuss amlodipine causing swelling.   Seeing vascular doctors for lymphedema they are going to give jer the lymphedema pump for legs, getting it in [place].   Has been using the compression stockings, and they did ul/s lower leg.Left worse swelling than right.  If not taking hctz/triamterene it does swell worse.  Eye doctor- seeing in 11/21. No retinopathy per pt.  hsa low vit d 27, is taking vit d supplemnt.  Noticing some swelling when taking amlodipine.  DM2- Pt not taking trulicity working weekly.  occ missing a week.  Not feeling good or tiered when taking it.  Pt stating has some chronic low back pain and using lidocaine patches and helping.  occ needing flexeril and tramadol prn for back pain.  Results for orders placed or performed in visit on 06/08/20  POCT glycosylated hemoglobin (Hb A1C)  Result Value Ref Range   Hemoglobin A1C 7.1 (A) 4.0 - 5.6 %   HbA1c POC (<> result, manual entry)     HbA1c, POC (prediabetic range)     HbA1c, POC (controlled diabetic range)       Medical History Nakiah has a past medical history of Anemia, Arthritis, Back pain, Gastritis, Glaucoma, Hypertension, Knee pain, and Type 2 diabetes mellitus (HCC).   Outpatient Encounter Medications as of 06/08/2020  Medication Sig  . acetaminophen (TYLENOL) 650 MG CR tablet Take 650 mg by mouth every 8 (eight) hours as needed. arthritis  . Calcium  Carbonate-Vitamin D (CALCIUM + D PO) Take by mouth daily.   . cetirizine (ZYRTEC) 10 MG tablet Take 10 mg by mouth daily.  . Dulaglutide (TRULICITY) 0.75 MG/0.5ML SOPN INJECT 1 DOSE SUBCUTANEOUSLY ONCE A WEEK  . famotidine (PEPCID) 20 MG tablet TAKE 1 TABLET BY MOUTH TWICE DAILY  . ferrous sulfate dried (SLOW FE) 160 (50 FE) MG TBCR Take 160 mg by mouth 3 (three) times daily with meals.  . fluticasone (FLONASE) 50 MCG/ACT nasal spray Place 2 sprays into both nostrils daily.  Marland Kitchen glipiZIDE (GLUCOTROL) 5 MG tablet Take 1 tablet by mouth twice daily  . latanoprost (XALATAN) 0.005 % ophthalmic solution INSTILL 1 DROP IN [facility]  AT BEDTIME  . Multiple Vitamin (MULTIVITAMIN WITH MINERALS) TABS Take 1 tablet by mouth daily.  Marland Kitchen triamterene-hydrochlorothiazide (DYAZIDE) 37.5-25 MG capsule TAKE 1 CAPSULE BY MOUTH  EACH DAY  . [DISCONTINUED] amLODipine (NORVASC) 5 MG tablet Take 1 tablet (5 mg total) by mouth daily.  . [DISCONTINUED] cyclobenzaprine (FLEXERIL) 10 MG tablet Take 1 tablet (10 mg total) by mouth 3 (three) times daily as needed. Muscle spasms  . [DISCONTINUED] gabapentin (NEURONTIN) 100 MG capsule Take 1 capsule (100 mg total) by mouth 2 (two) times daily.  . [DISCONTINUED] lidocaine (LIDODERM) 5 % USE 1 PATCH EXTERNALLY ONCE DAILY. REMOVE AND DISCARD  PATCH WITHIN 12 HOURS  . [DISCONTINUED] metoprolol tartrate (LOPRESSOR) 50 MG tablet Take 1 tablet (50 mg total) by mouth 2 (two) times daily.  . [DISCONTINUED] traMADol (ULTRAM) 50 MG tablet Take 1 tablet (50 mg total) by mouth 2 (two) times daily. pain  . cyclobenzaprine (FLEXERIL) 10 MG tablet Take 1 tablet (10 mg total) by mouth 3 (three) times daily as needed. Muscle spasms  . gabapentin (NEURONTIN) 100 MG capsule Take 1 capsule (100 mg total) by mouth 2 (two) times daily.  Marland Kitchen lidocaine (LIDODERM) 5 % Remove & Discard patch within 12 hours or as directed by MD  . losartan (COZAAR) 25 MG tablet Take 1 tablet (25 mg total) by mouth daily.  .  metoprolol tartrate (LOPRESSOR) 50 MG tablet Take 1 tablet (50 mg total) by mouth 2 (two) times daily.  . traMADol (ULTRAM) 50 MG tablet Take 1 tablet (50 mg total) by mouth 2 (two) times daily. pain  . [DISCONTINUED] nystatin (MYCOSTATIN) 100000 UNIT/ML suspension Take 5 mLs by mouth 4 (four) times daily.   Facility-Administered Encounter Medications as of 06/08/2020  Medication  . methylPREDNISolone acetate (DEPO-MEDROL) injection 40 mg  . methylPREDNISolone acetate (DEPO-MEDROL) injection 40 mg     Review of Systems  Constitutional: Negative for chills and fever.  HENT: Negative for congestion, rhinorrhea and sore throat.   Respiratory: Negative for cough, shortness of breath and wheezing.   Cardiovascular: Negative for chest pain and leg swelling (chronic).  Gastrointestinal: Negative for abdominal pain, diarrhea, nausea and vomiting.  Genitourinary: Negative for dysuria and frequency.  Musculoskeletal: Negative for arthralgias and back pain (chronic).  Skin: Negative for rash.  Neurological: Negative for dizziness, weakness and headaches.     Vitals BP (!) 142/90   Pulse 77   Temp 97.6 F (36.4 C)   Ht 5\' 6"  (1.676 m)   Wt (!) 308 lb (139.7 kg)   SpO2 97%   BMI 49.71 kg/m   Objective:   Physical Exam Vitals and nursing note reviewed.  Constitutional:      General: She is not in acute distress.    Appearance: Normal appearance. She is not ill-appearing.  HENT:     Head: Normocephalic and atraumatic.  Eyes:     Extraocular Movements: Extraocular movements intact.     Conjunctiva/sclera: Conjunctivae normal.     Pupils: Pupils are equal, round, and reactive to light.  Cardiovascular:     Rate and Rhythm: Normal rate and regular rhythm.     Pulses: Normal pulses.     Heart sounds: Normal heart sounds. No murmur heard.   Pulmonary:     Effort: Pulmonary effort is normal.     Breath sounds: Normal breath sounds. No wheezing, rhonchi or rales.  Musculoskeletal:         General: Normal range of motion.     Right lower leg: Edema (+2) present.     Left lower leg: Edema (+2) present.     Comments: +compression stockings in place.  Skin:    General: Skin is warm and dry.     Findings: No lesion or rash.  Neurological:     General: No focal deficit present.     Mental Status: She is alert and oriented to person, place, and time.     Cranial Nerves: No cranial nerve deficit.  Psychiatric:        Mood and Affect: Mood normal.        Behavior: Behavior normal.      Assessment  and Plan   1. Diabetes mellitus without complication (HCC) - POCT glycosylated hemoglobin (Hb A1C) - Microalbumin / creatinine urine ratio  2. Varicose veins of both legs with edema  3. Primary hypertension  4. Chronic bilateral low back pain without sciatica   Htn- suboptimal.  Hasn't taken triamtere yet today. Pt advised to take it when getting home.  Lower leg swelling -stable.   will Dc norvasc due to swelling.  Will give losartan 25mg  qhs.  Pt taking other bp meds in am. Not taken the triamtere/hctz yet today, and bp is slightly elevated.  Having neuropathy in feet taking gabpentin and tramadol.  Taking tramadol for pain and flexeril for back pain.  Lower leg edema- cont f/u with vein center and wearing compression stockings. Also to f/u with the lymphedema pump in January.   Chronic low back pain-stable.  taking tramadol and flexeril prn.  f/u 79mo or prn

## 2020-06-09 LAB — MICROALBUMIN / CREATININE URINE RATIO
Creatinine, Urine: 44.1 mg/dL
Microalb/Creat Ratio: 87 mg/g creat — ABNORMAL HIGH (ref 0–29)
Microalbumin, Urine: 38.4 ug/mL

## 2020-06-10 ENCOUNTER — Other Ambulatory Visit: Payer: Self-pay | Admitting: *Deleted

## 2020-06-10 ENCOUNTER — Other Ambulatory Visit: Payer: Self-pay | Admitting: Family Medicine

## 2020-06-10 MED ORDER — GLIPIZIDE 5 MG PO TABS: 5.0000 mg | ORAL_TABLET | Freq: Two times a day (BID) | ORAL | 2 refills | Status: DC

## 2020-06-25 ENCOUNTER — Telehealth: Payer: Self-pay | Admitting: Family Medicine

## 2020-06-25 MED ORDER — FUROSEMIDE 20 MG PO TABS: 20.0000 mg | ORAL_TABLET | Freq: Every day | ORAL | 3 refills | Status: DC

## 2020-06-25 NOTE — Telephone Encounter (Signed)
Discussed with pt. Pt verbalized understanding and pt scheduled a 1 month follow up.

## 2020-06-25 NOTE — Telephone Encounter (Signed)
Add 20mg  lasix daily for leg swelling and blood pressure.  Stop the losartan.   Come in 1 month for follow up to recheck legs/bp.   Thx.   Dr. 

## 2020-06-25 NOTE — Telephone Encounter (Signed)
Sent lasix to her pharmacy. Thx. Dr Ladona Ridgel

## 2020-06-25 NOTE — Telephone Encounter (Signed)
Patient states losartan 25 mg is making her swell she stopped taking it can you call in something else. [facility] [place]

## 2020-07-26 ENCOUNTER — Ambulatory Visit: Payer: BC Managed Care – PPO | Admitting: Family Medicine

## 2020-07-28 ENCOUNTER — Other Ambulatory Visit: Payer: Self-pay | Admitting: Family Medicine

## 2020-08-03 DIAGNOSIS — I890 Lymphedema, not elsewhere classified: Secondary | ICD-10-CM | POA: Diagnosis not present

## 2020-08-07 ENCOUNTER — Other Ambulatory Visit: Payer: Self-pay | Admitting: Family Medicine

## 2020-08-31 DIAGNOSIS — I890 Lymphedema, not elsewhere classified: Secondary | ICD-10-CM | POA: Diagnosis not present

## 2020-09-12 ENCOUNTER — Other Ambulatory Visit: Payer: Self-pay | Admitting: Family Medicine

## 2020-09-13 ENCOUNTER — Other Ambulatory Visit: Payer: Self-pay | Admitting: Family Medicine

## 2020-09-13 MED ORDER — LIDOCAINE 5 % EX PTCH: MEDICATED_PATCH | CUTANEOUS | 0 refills | Status: DC

## 2020-09-13 NOTE — Telephone Encounter (Signed)
Pls give 30 patches if still having back pain then pt needing to return to office for recheck.  Thx. Dr. Ladona Ridgel

## 2020-09-13 NOTE — Telephone Encounter (Signed)
Pharmacy requesting refill on Lidocaine Patch 5%. Apply one patch topically to clean, dry skin. Leave on for 12 hours then remove. Must wait at least 12 hours before applying patches again. Pt last seen 06/08/20 for DM. Please advise. Thank you

## 2020-10-01 DIAGNOSIS — I890 Lymphedema, not elsewhere classified: Secondary | ICD-10-CM | POA: Diagnosis not present

## 2020-10-08 DIAGNOSIS — I8312 Varicose veins of left lower extremity with inflammation: Secondary | ICD-10-CM | POA: Diagnosis not present

## 2020-10-08 DIAGNOSIS — I83812 Varicose veins of left lower extremities with pain: Secondary | ICD-10-CM | POA: Diagnosis not present

## 2020-10-11 ENCOUNTER — Other Ambulatory Visit: Payer: Self-pay | Admitting: Family Medicine

## 2020-10-11 DIAGNOSIS — I8312 Varicose veins of left lower extremity with inflammation: Secondary | ICD-10-CM | POA: Diagnosis not present

## 2020-10-14 ENCOUNTER — Other Ambulatory Visit: Payer: Self-pay | Admitting: Family Medicine

## 2020-10-14 NOTE — Telephone Encounter (Signed)
Patient is requesting refill on glipizide 5 mg and lidocaine patches called into [facility] [place]. She states completely out had pharamcy send over refill on 4/25 but no repsonse

## 2020-10-26 DIAGNOSIS — I8312 Varicose veins of left lower extremity with inflammation: Secondary | ICD-10-CM | POA: Diagnosis not present

## 2020-10-26 DIAGNOSIS — I83892 Varicose veins of left lower extremities with other complications: Secondary | ICD-10-CM | POA: Diagnosis not present

## 2020-11-09 DIAGNOSIS — I8312 Varicose veins of left lower extremity with inflammation: Secondary | ICD-10-CM | POA: Diagnosis not present

## 2020-11-14 ENCOUNTER — Other Ambulatory Visit: Payer: Self-pay | Admitting: Nurse Practitioner

## 2020-11-14 ENCOUNTER — Other Ambulatory Visit: Payer: Self-pay | Admitting: Family Medicine

## 2020-11-16 DIAGNOSIS — H40053 Ocular hypertension, bilateral: Secondary | ICD-10-CM | POA: Diagnosis not present

## 2020-11-17 ENCOUNTER — Other Ambulatory Visit: Payer: Self-pay | Admitting: Family Medicine

## 2020-11-17 NOTE — Telephone Encounter (Signed)
Last med check up 12/21 and does have upcoming appt

## 2020-11-17 NOTE — Telephone Encounter (Signed)
Patient is requesting refill on glipizide 5mg  called into [facility]. She is completely out has appointment on 6/21 for medication followup.

## 2020-11-18 NOTE — Telephone Encounter (Signed)
Left message to return call 

## 2020-12-07 ENCOUNTER — Encounter: Payer: Self-pay | Admitting: Family Medicine

## 2020-12-07 ENCOUNTER — Other Ambulatory Visit: Payer: Self-pay

## 2020-12-07 ENCOUNTER — Telehealth: Payer: Self-pay | Admitting: Family Medicine

## 2020-12-07 ENCOUNTER — Ambulatory Visit (INDEPENDENT_AMBULATORY_CARE_PROVIDER_SITE_OTHER): Payer: BC Managed Care – PPO | Admitting: Family Medicine

## 2020-12-07 VITALS — BP 173/84 | HR 63 | Temp 98.0°F | Wt 310.8 lb

## 2020-12-07 DIAGNOSIS — I83893 Varicose veins of bilateral lower extremities with other complications: Secondary | ICD-10-CM | POA: Diagnosis not present

## 2020-12-07 DIAGNOSIS — E1169 Type 2 diabetes mellitus with other specified complication: Secondary | ICD-10-CM | POA: Diagnosis not present

## 2020-12-07 DIAGNOSIS — I10 Essential (primary) hypertension: Secondary | ICD-10-CM

## 2020-12-07 MED ORDER — GLIPIZIDE 5 MG PO TABS: 5.0000 mg | ORAL_TABLET | Freq: Two times a day (BID) | ORAL | 1 refills | Status: DC

## 2020-12-07 MED ORDER — METOPROLOL TARTRATE 50 MG PO TABS: 50.0000 mg | ORAL_TABLET | Freq: Two times a day (BID) | ORAL | 5 refills | Status: DC

## 2020-12-07 MED ORDER — METFORMIN HCL ER 500 MG PO TB24: 500.0000 mg | ORAL_TABLET | Freq: Every day | ORAL | 1 refills | Status: DC

## 2020-12-07 MED ORDER — GLIPIZIDE 5 MG PO TABS: 5.0000 mg | ORAL_TABLET | Freq: Two times a day (BID) | ORAL | 5 refills | Status: DC

## 2020-12-07 MED ORDER — LIDOCAINE 5 % EX PTCH: MEDICATED_PATCH | CUTANEOUS | 2 refills | Status: DC

## 2020-12-07 MED ORDER — METFORMIN HCL ER 500 MG PO TB24: 500.0000 mg | ORAL_TABLET | Freq: Every day | ORAL | 5 refills | Status: DC

## 2020-12-07 MED ORDER — FUROSEMIDE 20 MG PO TABS: 20.0000 mg | ORAL_TABLET | Freq: Every day | ORAL | 5 refills | Status: DC

## 2020-12-07 MED ORDER — METOPROLOL TARTRATE 50 MG PO TABS: 50.0000 mg | ORAL_TABLET | Freq: Two times a day (BID) | ORAL | 1 refills | Status: DC

## 2020-12-07 MED ORDER — FUROSEMIDE 20 MG PO TABS: 20.0000 mg | ORAL_TABLET | Freq: Every day | ORAL | 1 refills | Status: DC

## 2020-12-07 NOTE — Progress Notes (Signed)
Patient ID: Tracey Turner, female    DOB: [birth year], 65 y.o.   MRN: [id number]   Chief Complaint  Patient presents with   Diabetes   Hypertension   Subjective:    HPI Pt here for follow up on blood pressure and HTN. HTN has been good but sugars have not been. Pt states partly her but partly medication. Pt states she was given Metformin messes with stomach and heart. Pt states Trulicity makes her feel funny.   Does not take Lasix as much as she used to. Has been going to vein specialist.  Pt states her body rejects lots of meds.   DM2-Pt requesting glipizide.  Pt needing refills.  Pt taking glipizide and was on trulicity, but not on it now due to not feeling good on it.  So then went to her work and clinic there started her on metformin ER 500mg , 1x per day in evening.  A1c from her work was 9.8- A1c.- recently per pt. Last a1c in medial file from 2/22- 7.9 -a1c.  Glucose this week- 138-145 in am.  HTN Pt compliant with BP meds.  No SEs Denies chest pain, sob, LE swelling, or blurry vision.  Dm2- Compliant with medications. Checking blood glucose.   Not seeing any high or low numbers.  Denies polyuria or polydipsia.    Medical History Latita has a past medical history of Anemia, Arthritis, Back pain, Gastritis, Glaucoma, Hypertension, Knee pain, and Type 2 diabetes mellitus (HCC).   Outpatient Encounter Medications as of 12/07/2020  Medication Sig   acetaminophen (TYLENOL) 650 MG CR tablet Take 650 mg by mouth every 8 (eight) hours as needed. arthritis   Calcium Carbonate-Vitamin D (CALCIUM + D PO) Take by mouth daily.    cetirizine (ZYRTEC) 10 MG tablet Take 10 mg by mouth daily.   cyclobenzaprine (FLEXERIL) 10 MG tablet Take 1 tablet (10 mg total) by mouth 3 (three) times daily as needed. Muscle spasms   ferrous sulfate dried (SLOW FE) 160 (50 FE) MG TBCR Take 160 mg by mouth 3 (three) times daily with meals.   fluticasone (FLONASE) 50 MCG/ACT nasal spray Place 2  sprays into both nostrils daily.   gabapentin (NEURONTIN) 100 MG capsule Take 1 capsule by mouth twice daily   latanoprost (XALATAN) 0.005 % ophthalmic solution INSTILL 1 DROP IN [facility]  AT BEDTIME   Multiple Vitamin (MULTIVITAMIN WITH MINERALS) TABS Take 1 tablet by mouth daily.   traMADol (ULTRAM) 50 MG tablet Take 1 tablet (50 mg total) by mouth 2 (two) times daily. pain   triamterene-hydrochlorothiazide (DYAZIDE) 37.5-25 MG capsule Take 1 capsule by mouth once daily   [DISCONTINUED] furosemide (LASIX) 20 MG tablet Take 1 tablet by mouth once daily   [DISCONTINUED] glipiZIDE (GLUCOTROL) 5 MG tablet Take 1 tablet by mouth twice daily   [DISCONTINUED] lidocaine (LIDODERM) 5 % USE 1 PATCH EXTERNALLY ONCE DAILY. REMOVE AND DISCARD AFTER 12 HOURS OR AS DIRECTED BY MD. RETURN TO OFFICE IF STILL HAVING BACK PAIN   [DISCONTINUED] metFORMIN (GLUCOPHAGE-XR) 500 MG 24 hr tablet Take 1 tablet (500 mg total) by mouth daily with breakfast.   [DISCONTINUED] metoprolol tartrate (LOPRESSOR) 50 MG tablet Take 1 tablet by mouth twice daily   furosemide (LASIX) 20 MG tablet Take 1 tablet (20 mg total) by mouth daily.   glipiZIDE (GLUCOTROL) 5 MG tablet Take 1 tablet (5 mg total) by mouth 2 (two) times daily.   lidocaine (LIDODERM) 5 % USE 1 PATCH EXTERNALLY  ONCE DAILY. REMOVE AND DISCARD AFTER 12 HOURS OR AS DIRECTED BY MD. RETURN TO OFFICE IF STILL HAVING BACK PAIN   metFORMIN (GLUCOPHAGE-XR) 500 MG 24 hr tablet Take 1 tablet (500 mg total) by mouth daily with breakfast.   metoprolol tartrate (LOPRESSOR) 50 MG tablet Take 1 tablet (50 mg total) by mouth 2 (two) times daily.   [DISCONTINUED] Dulaglutide (TRULICITY) 0.75 MG/0.5ML SOPN INJECT 1 DOSE SUBCUTANEOUSLY ONCE A WEEK   [DISCONTINUED] famotidine (PEPCID) 20 MG tablet TAKE 1 TABLET BY MOUTH TWICE DAILY   [DISCONTINUED] furosemide (LASIX) 20 MG tablet Take 1 tablet (20 mg total) by mouth daily.   [DISCONTINUED] glipiZIDE (GLUCOTROL) 5 MG tablet Take 1 tablet  (5 mg total) by mouth 2 (two) times daily.   [DISCONTINUED] lidocaine (LIDODERM) 5 % USE 1 PATCH EXTERNALLY ONCE DAILY. REMOVE AND DISCARD AFTER 12 HOURS OR AS DIRECTED BY MD. RETURN TO OFFICE IF STILL HAVING BACK PAIN   [DISCONTINUED] metoprolol tartrate (LOPRESSOR) 50 MG tablet Take 1 tablet (50 mg total) by mouth 2 (two) times daily.   Facility-Administered Encounter Medications as of 12/07/2020  Medication   methylPREDNISolone acetate (DEPO-MEDROL) injection 40 mg   methylPREDNISolone acetate (DEPO-MEDROL) injection 40 mg     Review of Systems  Constitutional:  Negative for chills and fever.  HENT:  Negative for congestion, rhinorrhea and sore throat.   Respiratory:  Negative for cough, shortness of breath and wheezing.   Cardiovascular:  Negative for chest pain and leg swelling.  Gastrointestinal:  Negative for abdominal pain, diarrhea, nausea and vomiting.  Genitourinary:  Negative for dysuria and frequency.  Musculoskeletal:  Negative for arthralgias and back pain.  Skin:  Negative for rash.  Neurological:  Negative for dizziness, weakness and headaches.    Vitals BP (!) 173/84   Pulse 63   Temp 98 F (36.7 C)   Wt (!) 310 lb 12.8 oz (141 kg)   SpO2 96%   BMI 50.16 kg/m   Objective:   Physical Exam Vitals and nursing note reviewed.  Constitutional:      Appearance: Normal appearance.  HENT:     Head: Normocephalic and atraumatic.     Nose: Nose normal.     Mouth/Throat:     Mouth: Mucous membranes are moist.     Pharynx: Oropharynx is clear.  Eyes:     Extraocular Movements: Extraocular movements intact.     Conjunctiva/sclera: Conjunctivae normal.     Pupils: Pupils are equal, round, and reactive to light.  Cardiovascular:     Rate and Rhythm: Normal rate and regular rhythm.     Pulses: Normal pulses.     Heart sounds: Normal heart sounds.  Pulmonary:     Effort: Pulmonary effort is normal.     Breath sounds: Normal breath sounds. No wheezing, rhonchi or  rales.  Musculoskeletal:        General: Normal range of motion.     Right lower leg: No edema.     Left lower leg: No edema.  Skin:    General: Skin is warm and dry.     Findings: No lesion or rash.  Neurological:     General: No focal deficit present.     Mental Status: She is alert and oriented to person, place, and time.  Psychiatric:        Mood and Affect: Mood normal.        Behavior: Behavior normal.     Assessment and Plan   1. Type 2 diabetes  mellitus with other specified complication, without long-term current use of insulin (HCC) - CMP14+EGFR - Hemoglobin A1c - Lipid panel - Microalbumin, urine  2. Primary hypertension   Htn/ LE edema- BP is high today, pt upset about having to come into appt ot get labs checked and upset with trying to set up appt. Advisied pt to discuss with the manager at the front with her concerns of trying to make an appt.  In Jan pt called back saying losartan causing leg swelling. We discontinued the losartan and gave 20mg  lasix.  Then gave a 1 mo f/u appt.  Pt didn't come to appt.  In 12/21- stating amlodipine caused swelling to legs also. Saw by vascular doctors for lymphedema and were going to give her a pump for her legs.  Taking hctz/triamterene.  DM2- not controlled. Pt stating she is on metformin from her work medical clinic. Pt to continue with glipizide 5mg  bid.  Pt discontinued trulicity due to side effects.  Advised to pt needs to get meds and labs from one or the other so one provider is managing the medications and refills.  Pt voiced understanding. Pt needing to get labs, ordered today.  Return in about 3 months (around 03/09/2021) for f/u dm2, htn, .  BP Readings from Last 3 Encounters:  12/07/20 (!) 173/84  06/08/20 (!) 142/90  01/30/20 (!) 142/76   Counseling time- spent greater than on counseling patient, coordination of care, reviewing labs and charting.

## 2020-12-07 NOTE — Telephone Encounter (Signed)
Patient brought in FMLA to be updated in your box to review date and sign

## 2020-12-10 DIAGNOSIS — I8312 Varicose veins of left lower extremity with inflammation: Secondary | ICD-10-CM | POA: Diagnosis not present

## 2020-12-18 ENCOUNTER — Other Ambulatory Visit: Payer: Self-pay | Admitting: Family Medicine

## 2020-12-20 MED ORDER — FUROSEMIDE 20 MG PO TABS: 20.0000 mg | ORAL_TABLET | Freq: Every day | ORAL | 0 refills | Status: DC

## 2020-12-21 NOTE — Telephone Encounter (Signed)
[facility] contacted and refills cancelled. MyChart message sent to patient regarding labs.

## 2020-12-22 ENCOUNTER — Other Ambulatory Visit: Payer: Self-pay | Admitting: Family Medicine

## 2020-12-22 NOTE — Progress Notes (Signed)
Pt stating not taking lasix.  We will take out of the chart.  Dr. Ladona Ridgel

## 2020-12-22 NOTE — Telephone Encounter (Signed)
Pharmacy contacted and script cancelled.

## 2020-12-27 DIAGNOSIS — I8312 Varicose veins of left lower extremity with inflammation: Secondary | ICD-10-CM | POA: Diagnosis not present

## 2021-01-03 DIAGNOSIS — N3001 Acute cystitis with hematuria: Secondary | ICD-10-CM | POA: Diagnosis not present

## 2021-01-03 DIAGNOSIS — R350 Frequency of micturition: Secondary | ICD-10-CM | POA: Diagnosis not present

## 2021-01-03 DIAGNOSIS — R600 Localized edema: Secondary | ICD-10-CM | POA: Diagnosis not present

## 2021-01-03 DIAGNOSIS — Z6841 Body Mass Index (BMI) 40.0 and over, adult: Secondary | ICD-10-CM | POA: Diagnosis not present

## 2021-01-06 ENCOUNTER — Encounter: Payer: Self-pay | Admitting: *Deleted

## 2021-01-21 ENCOUNTER — Telehealth: Payer: Self-pay | Admitting: Family Medicine

## 2021-01-21 NOTE — Telephone Encounter (Signed)
Pt had labs ordered in June 2022. Contacted patient to let her know they are still good. Pt verbalized understanding

## 2021-01-21 NOTE — Telephone Encounter (Signed)
Patient was given a sheet for labs during a previous visit and hasn't had the labs drawn yet; wants to know if it's still good of if she needs a new one. Please advise at [phone].

## 2021-01-31 DIAGNOSIS — N3001 Acute cystitis with hematuria: Secondary | ICD-10-CM | POA: Diagnosis not present

## 2021-01-31 DIAGNOSIS — Z6841 Body Mass Index (BMI) 40.0 and over, adult: Secondary | ICD-10-CM | POA: Diagnosis not present

## 2021-01-31 DIAGNOSIS — R350 Frequency of micturition: Secondary | ICD-10-CM | POA: Diagnosis not present

## 2021-02-09 DIAGNOSIS — Z6841 Body Mass Index (BMI) 40.0 and over, adult: Secondary | ICD-10-CM | POA: Diagnosis not present

## 2021-02-09 DIAGNOSIS — R509 Fever, unspecified: Secondary | ICD-10-CM | POA: Diagnosis not present

## 2021-02-09 DIAGNOSIS — J329 Chronic sinusitis, unspecified: Secondary | ICD-10-CM | POA: Diagnosis not present

## 2021-02-28 DIAGNOSIS — E1169 Type 2 diabetes mellitus with other specified complication: Secondary | ICD-10-CM | POA: Diagnosis not present

## 2021-03-01 ENCOUNTER — Telehealth: Payer: Self-pay | Admitting: Family Medicine

## 2021-03-01 LAB — HEMOGLOBIN A1C
Est. average glucose Bld gHb Est-mCnc: 206 mg/dL
Hgb A1c MFr Bld: 8.8 % — ABNORMAL HIGH (ref 4.8–5.6)

## 2021-03-01 LAB — MICROALBUMIN, URINE: Microalbumin, Urine: 17.7 ug/mL

## 2021-03-01 LAB — CMP14+EGFR
ALT: 13 IU/L (ref 0–32)
AST: 15 IU/L (ref 0–40)
Albumin/Globulin Ratio: 1.3 (ref 1.2–2.2)
Albumin: 4.2 g/dL (ref 3.8–4.8)
Alkaline Phosphatase: 71 IU/L (ref 44–121)
BUN/Creatinine Ratio: 11 — ABNORMAL LOW (ref 12–28)
BUN: 9 mg/dL (ref 8–27)
Bilirubin Total: 0.4 mg/dL (ref 0.0–1.2)
CO2: 22 mmol/L (ref 20–29)
Calcium: 9 mg/dL (ref 8.7–10.3)
Chloride: 99 mmol/L (ref 96–106)
Creatinine, Ser: 0.82 mg/dL (ref 0.57–1.00)
Globulin, Total: 3.3 g/dL (ref 1.5–4.5)
Glucose: 145 mg/dL — ABNORMAL HIGH (ref 65–99)
Potassium: 4.6 mmol/L (ref 3.5–5.2)
Sodium: 140 mmol/L (ref 134–144)
Total Protein: 7.5 g/dL (ref 6.0–8.5)
eGFR: 79 mL/min/{1.73_m2} (ref >59)

## 2021-03-01 LAB — LIPID PANEL
Chol/HDL Ratio: 3.2 ratio (ref 0.0–4.4)
Cholesterol, Total: 184 mg/dL (ref 100–199)
HDL: 58 mg/dL (ref >39)
LDL Chol Calc (NIH): 100 mg/dL — ABNORMAL HIGH (ref 0–99)
Triglycerides: 148 mg/dL (ref 0–149)
VLDL Cholesterol Cal: 26 mg/dL (ref 5–40)

## 2021-03-01 MED ORDER — METFORMIN HCL ER 500 MG PO TB24: ORAL_TABLET | ORAL | 0 refills | Status: DC

## 2021-03-02 ENCOUNTER — Other Ambulatory Visit: Payer: Self-pay | Admitting: Family Medicine

## 2021-03-02 MED ORDER — METFORMIN HCL ER 750 MG PO TB24: ORAL_TABLET | ORAL | 1 refills | Status: DC

## 2021-03-02 MED ORDER — METFORMIN HCL ER 750 MG PO TB24: ORAL_TABLET | ORAL | 0 refills | Status: DC

## 2021-03-02 NOTE — Telephone Encounter (Signed)
Pt states she is unable to take Metformin BID. Please see result note.

## 2021-03-04 ENCOUNTER — Telehealth: Payer: Self-pay | Admitting: Family Medicine

## 2021-03-04 DIAGNOSIS — I83811 Varicose veins of right lower extremities with pain: Secondary | ICD-10-CM | POA: Diagnosis not present

## 2021-03-04 DIAGNOSIS — I83891 Varicose veins of right lower extremities with other complications: Secondary | ICD-10-CM | POA: Diagnosis not present

## 2021-03-04 DIAGNOSIS — I8311 Varicose veins of right lower extremity with inflammation: Secondary | ICD-10-CM | POA: Diagnosis not present

## 2021-03-04 NOTE — Telephone Encounter (Signed)
Patient has been made aware of drs recommendations, and she verbalizes understanding.

## 2021-03-04 NOTE — Telephone Encounter (Signed)
error 

## 2021-03-07 DIAGNOSIS — I8311 Varicose veins of right lower extremity with inflammation: Secondary | ICD-10-CM | POA: Diagnosis not present

## 2021-03-09 DIAGNOSIS — I8311 Varicose veins of right lower extremity with inflammation: Secondary | ICD-10-CM | POA: Diagnosis not present

## 2021-03-18 ENCOUNTER — Ambulatory Visit: Payer: BC Managed Care – PPO | Admitting: Nurse Practitioner

## 2021-04-03 DIAGNOSIS — Z6841 Body Mass Index (BMI) 40.0 and over, adult: Secondary | ICD-10-CM | POA: Diagnosis not present

## 2021-04-03 DIAGNOSIS — N390 Urinary tract infection, site not specified: Secondary | ICD-10-CM | POA: Diagnosis not present

## 2021-04-03 DIAGNOSIS — R350 Frequency of micturition: Secondary | ICD-10-CM | POA: Diagnosis not present

## 2021-04-15 ENCOUNTER — Other Ambulatory Visit: Payer: Self-pay

## 2021-04-15 ENCOUNTER — Ambulatory Visit (INDEPENDENT_AMBULATORY_CARE_PROVIDER_SITE_OTHER): Payer: BC Managed Care – PPO | Admitting: Family Medicine

## 2021-04-15 VITALS — BP 139/86 | HR 84 | Ht 66.0 in | Wt 302.6 lb

## 2021-04-15 DIAGNOSIS — Z23 Encounter for immunization: Secondary | ICD-10-CM | POA: Diagnosis not present

## 2021-04-15 DIAGNOSIS — E1165 Type 2 diabetes mellitus with hyperglycemia: Secondary | ICD-10-CM

## 2021-04-15 DIAGNOSIS — D509 Iron deficiency anemia, unspecified: Secondary | ICD-10-CM | POA: Diagnosis not present

## 2021-04-15 DIAGNOSIS — E785 Hyperlipidemia, unspecified: Secondary | ICD-10-CM | POA: Insufficient documentation

## 2021-04-15 DIAGNOSIS — K219 Gastro-esophageal reflux disease without esophagitis: Secondary | ICD-10-CM | POA: Diagnosis not present

## 2021-04-15 DIAGNOSIS — M545 Low back pain, unspecified: Secondary | ICD-10-CM

## 2021-04-15 DIAGNOSIS — E7800 Pure hypercholesterolemia, unspecified: Secondary | ICD-10-CM

## 2021-04-15 DIAGNOSIS — I10 Essential (primary) hypertension: Secondary | ICD-10-CM

## 2021-04-15 MED ORDER — LIDOCAINE 5 % EX PTCH: MEDICATED_PATCH | CUTANEOUS | 2 refills | Status: DC

## 2021-04-15 MED ORDER — GLIPIZIDE 5 MG PO TABS: 5.0000 mg | ORAL_TABLET | Freq: Two times a day (BID) | ORAL | 1 refills | Status: DC

## 2021-04-15 MED ORDER — METOPROLOL TARTRATE 50 MG PO TABS: 50.0000 mg | ORAL_TABLET | Freq: Two times a day (BID) | ORAL | 1 refills | Status: DC

## 2021-04-15 MED ORDER — TRIAMTERENE-HCTZ 37.5-25 MG PO CAPS: 1.0000 | ORAL_CAPSULE | Freq: Every day | ORAL | 1 refills | Status: DC

## 2021-04-15 MED ORDER — METFORMIN HCL ER 750 MG PO TB24: ORAL_TABLET | ORAL | 1 refills | Status: DC

## 2021-04-15 MED ORDER — GABAPENTIN 100 MG PO CAPS: 100.0000 mg | ORAL_CAPSULE | Freq: Two times a day (BID) | ORAL | 1 refills | Status: DC

## 2021-04-15 MED ORDER — TRAMADOL HCL 50 MG PO TABS: 50.0000 mg | ORAL_TABLET | Freq: Two times a day (BID) | ORAL | 1 refills | Status: DC | PRN

## 2021-04-15 MED ORDER — CYCLOBENZAPRINE HCL 10 MG PO TABS: 10.0000 mg | ORAL_TABLET | Freq: Three times a day (TID) | ORAL | 2 refills | Status: DC | PRN

## 2021-04-15 MED ORDER — FUROSEMIDE 20 MG PO TABS: 20.0000 mg | ORAL_TABLET | Freq: Every day | ORAL | 1 refills | Status: DC | PRN

## 2021-04-15 NOTE — Assessment & Plan Note (Signed)
No recent CBC. CBC today.

## 2021-04-15 NOTE — Assessment & Plan Note (Signed)
Most recent A1c reveals that her blood sugars have been uncontrolled.  She has made some lifestyle changes and her blood sugar readings are much improved.  Will not add or change pharmacotherapy at this time.  She needs a follow-up A1c in December.  If A1c still elevated will discuss adding GLP-1 or SGLT2.  Continue metformin and glipizide.

## 2021-04-15 NOTE — Assessment & Plan Note (Signed)
Acute on chronic.  I have refilled her Flexeril, tramadol, and Lidoderm patches.

## 2021-04-15 NOTE — Progress Notes (Signed)
Subjective:  Patient ID: Tracey Turner, female    DOB: [birth year]  Age: 65 y.o. MRN: [id number]  CC: Chief Complaint  Patient presents with   Diabetes    Follow up Sugars coming down and running better the last 2 weeks Flare of lower back pain- patient states she has hx of ruptured disc Needs RF Flexeril and Lidoderm patches    HPI:  65 year old female with PMH of DM-2, HTN, Morbid obesity presents for follow-up.  DM-2 Blood sugars readings -patient blood sugar readings have been under 125. Hypoglycemia -patient has had low blood sugars but without symptoms.  Had a reading of 71 this morning. Medications -patient is on metformin XR 750 mg daily and glipizide 5 mg twice daily. Adverse effects - No. Compliance - Yes.  Preventative care Eye exam - Up to date. Foot exam - Needs foot exam today. Last A1C - 8.8 (02/28/21). Urine microalbumin - Up to daet. Candidate for statin - Yes.  Hypertension BPs have been stable.  Patient is currently on triamterene HCTZ and metoprolol.  She has either allergy or intolerance to amlodipine as well as ACE inhibitor.  States that she needs refills on all her meds.  Back pain Patient reports low back pain.  She states that she has a history of a bulging disc.  She states that she uses Flexeril and Lidoderm patches as needed.  She also uses tramadol as needed.  Patient states that she has been experiencing a "flare".  She states that she has been having worsening pain for the past 1.5 weeks.  No reports of radicular symptoms.  She is requesting a refill on her medications today.  Patient Active Problem List   Diagnosis Date Noted   Iron deficiency anemia 04/15/2021   Hyperlipidemia 04/15/2021   Low back pain 04/15/2021   Varicose veins of both legs with edema 02/02/2020   Ocular hypertension 12/05/2019   GERD (gastroesophageal reflux disease) 06/15/2014   Morbid obesity (HCC) 12/18/2013   Uncontrolled type 2 diabetes mellitus with hyperglycemia  (HCC) 09/12/2012   Essential hypertension 09/12/2012   Arthritis of knee 02/23/2011    Social Hx   Social History   Socioeconomic History   Marital status: Single    Spouse name: Not on file   Number of children: Not on file   Years of education: some coll.   Highest education level: Not on file  Occupational History   Occupation: [employer]    Employer: [employer]  Tobacco Use   Smoking status: Former    Packs/day: 1.00    Years: 30.00    Pack years: 30.00    Types: Cigarettes    Start date: 07/22/1970    Quit date: 06/21/2007    Years since quitting: 13.8   Smokeless tobacco: Never  Substance and Sexual Activity   Alcohol use: No    Alcohol/week: 0.0 standard drinks   Drug use: No   Sexual activity: Yes    Birth control/protection: Post-menopausal  Other Topics Concern   Not on file  Social History Narrative   Not on file   Social Determinants of Health   Financial Resource Strain: Not on file  Food Insecurity: Not on file  Transportation Needs: Not on file  Physical Activity: Not on file  Stress: Not on file  Social Connections: Not on file    Review of Systems  Constitutional: Negative.   Musculoskeletal:  Positive for back pain.    Objective:  BP 139/86  Pulse 84   Ht 5\' 6"  (1.676 m)   Wt (!) 302 lb 9.6 oz (137.3 kg)   SpO2 95%   BMI 48.84 kg/m   BP/Weight 04/15/2021 12/07/2020 06/08/2020  Systolic BP 139 173 142  Diastolic BP 86 84 90  Wt. (Lbs) 302.6 310.8 308  BMI 48.84 50.16 49.71    Physical Exam Vitals and nursing note reviewed.  Constitutional:      Appearance: Normal appearance. She is obese.  HENT:     Head: Normocephalic and atraumatic.  Eyes:     General:        Right eye: No discharge.        Left eye: No discharge.     Conjunctiva/sclera: Conjunctivae normal.  Cardiovascular:     Rate and Rhythm: Normal rate and regular rhythm.     Heart sounds: No murmur heard. Pulmonary:     Effort: Pulmonary effort is normal.      Breath sounds: Normal breath sounds. No wheezing, rhonchi or rales.  Neurological:     Mental Status: She is alert.  Psychiatric:        Mood and Affect: Mood normal.        Behavior: Behavior normal.    Lab Results  Component Value Date   WBC 8.0 02/13/2012   HGB 11.4 04/30/2018   HCT 35 01/10/2013   GLUCOSE 145 (H) 02/28/2021   CHOL 184 02/28/2021   TRIG 148 02/28/2021   HDL 58 02/28/2021   LDLCALC 100 (H) 02/28/2021   ALT 13 02/28/2021   AST 15 02/28/2021   NA 140 02/28/2021   K 4.6 02/28/2021   CL 99 02/28/2021   CREATININE 0.82 02/28/2021   BUN 9 02/28/2021   CO2 22 02/28/2021   HGBA1C 8.8 (H) 02/28/2021   MICROALBUR 2.53 (H) 09/22/2013     Assessment & Plan:   Problem List Items Addressed This Visit       Cardiovascular and Mediastinum   Essential hypertension    Stable.  We will continue metoprolol and triamterene HCTZ.  Would benefit from ARB.  Will revisit at follow-up.      Relevant Medications   furosemide (LASIX) 20 MG tablet   metoprolol tartrate (LOPRESSOR) 50 MG tablet   triamterene-hydrochlorothiazide (DYAZIDE) 37.5-25 MG capsule     Digestive   GERD (gastroesophageal reflux disease)     Endocrine   Uncontrolled type 2 diabetes mellitus with hyperglycemia (HCC) - Primary    Most recent A1c reveals that her blood sugars have been uncontrolled.  She has made some lifestyle changes and her blood sugar readings are much improved.  Will not add or change pharmacotherapy at this time.  She needs a follow-up A1c in December.  If A1c still elevated will discuss adding GLP-1 or SGLT2.  Continue metformin and glipizide.      Relevant Medications   glipiZIDE (GLUCOTROL) 5 MG tablet   metFORMIN (GLUCOPHAGE XR) 750 MG 24 hr tablet     Other   Low back pain    Acute on chronic.  I have refilled her Flexeril, tramadol, and Lidoderm patches.      Relevant Medications   cyclobenzaprine (FLEXERIL) 10 MG tablet   traMADol (ULTRAM) 50 MG tablet    Hyperlipidemia    Last LDL was 100.  I am unsure why she is not on statin therapy.  We will revisit this at follow-up.      Relevant Medications   furosemide (LASIX) 20 MG tablet   metoprolol tartrate (  LOPRESSOR) 50 MG tablet   triamterene-hydrochlorothiazide (DYAZIDE) 37.5-25 MG capsule   RESOLVED: Anemia   Relevant Orders   CBC   Other Visit Diagnoses     Need for vaccination       Relevant Orders   Flu Vaccine QUAD High Dose(Fluad) (Completed)   Pneumococcal polysaccharide vaccine 23-valent greater than or equal to 2yo subcutaneous/IM (Completed)       Meds ordered this encounter  Medications   cyclobenzaprine (FLEXERIL) 10 MG tablet    Sig: Take 1 tablet (10 mg total) by mouth 3 (three) times daily as needed for muscle spasms. Muscle spasms    Dispense:  30 tablet    Refill:  2   furosemide (LASIX) 20 MG tablet    Sig: Take 1 tablet (20 mg total) by mouth daily as needed for edema.    Dispense:  30 tablet    Refill:  1   gabapentin (NEURONTIN) 100 MG capsule    Sig: Take 1 capsule (100 mg total) by mouth 2 (two) times daily.    Dispense:  180 capsule    Refill:  1   glipiZIDE (GLUCOTROL) 5 MG tablet    Sig: Take 1 tablet (5 mg total) by mouth 2 (two) times daily.    Dispense:  180 tablet    Refill:  1   lidocaine (LIDODERM) 5 %    Sig: USE 1 PATCH EXTERNALLY ONCE DAILY. REMOVE AND DISCARD AFTER 12 HOURS OR AS DIRECTED BY MD. RETURN TO OFFICE IF STILL HAVING BACK PAIN    Dispense:  30 patch    Refill:  2   metFORMIN (GLUCOPHAGE XR) 750 MG 24 hr tablet    Sig: Take one tablet po daily with food    Dispense:  90 tablet    Refill:  1   metoprolol tartrate (LOPRESSOR) 50 MG tablet    Sig: Take 1 tablet (50 mg total) by mouth 2 (two) times daily.    Dispense:  180 tablet    Refill:  1   triamterene-hydrochlorothiazide (DYAZIDE) 37.5-25 MG capsule    Sig: Take 1 each (1 capsule total) by mouth daily.    Dispense:  90 capsule    Refill:  1   DISCONTD: traMADol  (ULTRAM) 50 MG tablet    Sig: Take 1 tablet (50 mg total) by mouth every 12 (twelve) hours as needed for moderate pain or severe pain.    Dispense:  60 tablet    Refill:  1    [facility]   traMADol (ULTRAM) 50 MG tablet    Sig: Take 1 tablet (50 mg total) by mouth every 12 (twelve) hours as needed for moderate pain or severe pain.    Dispense:  60 tablet    Refill:  1    [facility]    Follow-up:  Return ~ 3 months (after 12/2) Needs A1C, for Diabetes follow up.  Everlene Other DO [facility]

## 2021-04-15 NOTE — Assessment & Plan Note (Signed)
Last LDL was 100.  I am unsure why she is not on statin therapy.  We will revisit this at follow-up.

## 2021-04-15 NOTE — Assessment & Plan Note (Signed)
Stable.  We will continue metoprolol and triamterene HCTZ.  Would benefit from ARB.  Will revisit at follow-up.

## 2021-04-15 NOTE — Patient Instructions (Addendum)
Lab when you leave today.  Follow up in December (after the 12th). A1C prior to appt.  Call about your mammogram and colonoscopy.  Take care  Dr. Adriana Simas

## 2021-04-16 LAB — CBC
Hematocrit: 40.9 % (ref 34.0–46.6)
Hemoglobin: 12.8 g/dL (ref 11.1–15.9)
MCH: 27.1 pg (ref 26.6–33.0)
MCHC: 31.3 g/dL — ABNORMAL LOW (ref 31.5–35.7)
MCV: 87 fL (ref 79–97)
Platelets: 303 10*3/uL (ref 150–450)
RBC: 4.72 x10E6/uL (ref 3.77–5.28)
RDW: 15.2 % (ref 11.7–15.4)
WBC: 8.9 10*3/uL (ref 3.4–10.8)

## 2021-04-22 ENCOUNTER — Other Ambulatory Visit: Payer: Self-pay | Admitting: Family Medicine

## 2021-04-22 MED ORDER — ROSUVASTATIN CALCIUM 10 MG PO TABS: 10.0000 mg | ORAL_TABLET | Freq: Every day | ORAL | 1 refills | Status: DC

## 2021-05-17 DIAGNOSIS — H40053 Ocular hypertension, bilateral: Secondary | ICD-10-CM | POA: Diagnosis not present

## 2021-06-03 ENCOUNTER — Ambulatory Visit (INDEPENDENT_AMBULATORY_CARE_PROVIDER_SITE_OTHER): Payer: BC Managed Care – PPO | Admitting: Nurse Practitioner

## 2021-06-03 ENCOUNTER — Other Ambulatory Visit: Payer: Self-pay

## 2021-06-03 VITALS — BP 160/82 | HR 63 | Temp 97.3°F | Ht 66.0 in | Wt 301.0 lb

## 2021-06-03 DIAGNOSIS — R5383 Other fatigue: Secondary | ICD-10-CM | POA: Diagnosis not present

## 2021-06-03 DIAGNOSIS — Z1211 Encounter for screening for malignant neoplasm of colon: Secondary | ICD-10-CM

## 2021-06-03 DIAGNOSIS — I10 Essential (primary) hypertension: Secondary | ICD-10-CM | POA: Diagnosis not present

## 2021-06-03 DIAGNOSIS — Z01419 Encounter for gynecological examination (general) (routine) without abnormal findings: Secondary | ICD-10-CM

## 2021-06-03 MED ORDER — METFORMIN HCL ER 750 MG PO TB24: ORAL_TABLET | ORAL | 1 refills | Status: DC

## 2021-06-03 NOTE — Progress Notes (Signed)
Subjective:    Patient ID: Tracey Turner, female    DOB: 1955/07/12, 65 y.o.   MRN: [id number]  HPI  The patient comes in today for a wellness visit.    A review of their health history was completed.  A review of medications was also completed.  Any needed refills; May need refill on metFormin, needs to be sent to [facility] pharmacy  Eating habits: doing better with eating, drinks mostly water.  Has cut out her sodas.  Has lost 10 pounds.  Falls/  MVA accidents in past few months: No  Regular exercise: exercise but not she should; has been trying to walk more.  Specialist pt sees on regular basis: Dr. Genia Del- eye doctor and [facility]  Preventative health issues were discussed.   Would like to schedule colonoscopy with new physician at local GI office. Fasting glucose at home is improving with her weight loss and diet changes.  Normally runs 120 or below.  Was 151 this morning which was unusual.  BP outside of the office is running around 140/80.  She recently had a visit from housecalls program.  It is unclear whether this was her Medicare wellness visit.  They did a hemoglobin A1c on site which was 7.6. Gets regular eye exams every 6 months.  Regular dental exams.  Has had her COVID-vaccine, both of her shingles vaccine, flu vaccine and her pneumonia vaccines. No new sexual partners.  No vaginal discharge.  No pelvic pain.  Review of Systems  Constitutional:  Negative for activity change, appetite change and fatigue.  HENT:  Negative for sore throat and trouble swallowing.   Respiratory:  Negative for cough, chest tightness, shortness of breath and wheezing.   Cardiovascular:  Negative for chest pain.  Gastrointestinal:  Negative for abdominal distention, abdominal pain, blood in stool, constipation, diarrhea, nausea and vomiting.  Genitourinary:  Negative for difficulty urinating, dysuria, enuresis, frequency, genital sores, menstrual problem, pelvic pain, urgency, vaginal  bleeding and vaginal discharge.  Depression screen PHQ 2/9 04/15/2021  Decreased Interest 0  Down, Depressed, Hopeless 0  PHQ - 2 Score 0        Objective:   Physical Exam Vitals and nursing note reviewed.  Constitutional:      General: She is not in acute distress.    Appearance: She is well-developed.     Comments: Exam done in the chair, patient was unable to get onto exam table.  Neck:     Thyroid: No thyromegaly.     Trachea: No tracheal deviation.     Comments: Thyroid non tender to palpation. No mass or goiter noted.  Cardiovascular:     Rate and Rhythm: Normal rate and regular rhythm.     Heart sounds: Normal heart sounds.  Pulmonary:     Effort: Pulmonary effort is normal.     Breath sounds: Normal breath sounds.  Chest:  Breasts:    Right: No swelling, inverted nipple, mass, skin change or tenderness.     Left: No swelling, inverted nipple, mass, skin change or tenderness.  Abdominal:     General: There is no distension.     Palpations: Abdomen is soft.     Tenderness: There is no abdominal tenderness.  Genitourinary:    Comments: Defers GU and pelvic exams.  Denies any problems. Musculoskeletal:     Cervical back: Normal range of motion and neck supple.  Lymphadenopathy:     Cervical: No cervical adenopathy.     Upper Body:  Right upper body: No supraclavicular, axillary or pectoral adenopathy.     Left upper body: No supraclavicular, axillary or pectoral adenopathy.  Skin:    General: Skin is warm and dry.     Findings: No rash.  Neurological:     Mental Status: She is alert and oriented to person, place, and time.  Psychiatric:        Mood and Affect: Mood normal.        Behavior: Behavior normal.        Thought Content: Thought content normal.        Judgment: Judgment normal.   Today's Vitals   06/03/21 1314 06/03/21 1425  BP: (!) 159/82 (!) 160/82  Pulse: 63   Temp: (!) 97.3 F (36.3 C)   TempSrc: Oral   SpO2: 96%   Weight: (!) 301 lb  (136.5 kg)   Height: 5\' 6"  (1.676 m)    Body mass index is 48.58 kg/m. Her last Pap smear dated 04/30/2017 was normal with negative high-risk HPV.       Assessment & Plan:   Problem List Items Addressed This Visit       Cardiovascular and Mediastinum   Essential hypertension   Other Visit Diagnoses     Well woman exam    -  Primary   Other fatigue       Relevant Orders   TSH (Completed)   Screen for colon cancer       Relevant Orders   Ambulatory referral to Gastroenterology      Meds ordered this encounter  Medications   metFORMIN (GLUCOPHAGE XR) 750 MG 24 hr tablet    Sig: Take one tablet po daily with food    Dispense:  90 tablet    Refill:  1    Order Specific Question:   Supervising Provider    Answer:   Lilyan Punt A [[id number]]    Continue activity as tolerated and dietary changes.  No changes to her medication at this time.  We will wait to see how much improvement she gets with lifestyle measures. Patient to continue to monitor her BP outside of the office and to check her blood sugars. TSH pending. Repeat A1C with her next labs since she had one done recently.  Patient plans to schedule her own mammogram. Referred to gastroenterology for screening colonoscopy. Return in about 3 months (around 09/01/2021).

## 2021-06-04 ENCOUNTER — Encounter: Payer: Self-pay | Admitting: Nurse Practitioner

## 2021-06-04 LAB — TSH: TSH: 0.725 u[IU]/mL (ref 0.450–4.500)

## 2021-06-07 ENCOUNTER — Encounter: Payer: Self-pay | Admitting: Internal Medicine

## 2021-06-14 DIAGNOSIS — I8311 Varicose veins of right lower extremity with inflammation: Secondary | ICD-10-CM | POA: Diagnosis not present

## 2021-06-14 NOTE — Progress Notes (Signed)
Noted  

## 2021-06-24 ENCOUNTER — Other Ambulatory Visit: Payer: Self-pay | Admitting: Family Medicine

## 2021-06-24 ENCOUNTER — Telehealth: Payer: Self-pay | Admitting: Family Medicine

## 2021-06-24 MED ORDER — EZETIMIBE 10 MG PO TABS: 10.0000 mg | ORAL_TABLET | Freq: Every day | ORAL | 3 refills | Status: DC

## 2021-06-24 NOTE — Telephone Encounter (Signed)
Patient states had to stop taking rosuvastatin 10 mg because it was making her body ache all over she stopped taking it for a week and she is not hurting. She is wanting something else sent in. [facility]

## 2021-06-24 NOTE — Telephone Encounter (Signed)
Patient notified

## 2021-06-24 NOTE — Telephone Encounter (Signed)
Tracey Turner, Tracey G, DO   I sent in a new Rx - Zetia.

## 2021-07-15 ENCOUNTER — Ambulatory Visit (INDEPENDENT_AMBULATORY_CARE_PROVIDER_SITE_OTHER): Payer: Medicare Other | Admitting: Family Medicine

## 2021-07-15 ENCOUNTER — Other Ambulatory Visit: Payer: Self-pay

## 2021-07-15 ENCOUNTER — Encounter: Payer: Self-pay | Admitting: Family Medicine

## 2021-07-15 VITALS — BP 132/84 | HR 76 | Temp 98.7°F | Wt 303.2 lb

## 2021-07-15 DIAGNOSIS — I10 Essential (primary) hypertension: Secondary | ICD-10-CM

## 2021-07-15 DIAGNOSIS — E7800 Pure hypercholesterolemia, unspecified: Secondary | ICD-10-CM | POA: Diagnosis not present

## 2021-07-15 DIAGNOSIS — E1165 Type 2 diabetes mellitus with hyperglycemia: Secondary | ICD-10-CM

## 2021-07-15 MED ORDER — METOPROLOL TARTRATE 50 MG PO TABS
50.0000 mg | ORAL_TABLET | Freq: Two times a day (BID) | ORAL | 1 refills | Status: DC
Start: 2021-07-15 — End: 2022-01-19

## 2021-07-15 MED ORDER — METFORMIN HCL ER 750 MG PO TB24: ORAL_TABLET | ORAL | 1 refills | Status: DC

## 2021-07-15 MED ORDER — LIDOCAINE 5 % EX PTCH: MEDICATED_PATCH | CUTANEOUS | 2 refills | Status: DC

## 2021-07-15 NOTE — Assessment & Plan Note (Signed)
Control improving. Needs weight loss. Continue Glipizide and Metformin.

## 2021-07-15 NOTE — Assessment & Plan Note (Signed)
Did not tolerate statin or zetia. Will monitor closely.

## 2021-07-15 NOTE — Patient Instructions (Signed)
A1C at the end of February.   Follow up in 3-6 months.  Take care  Dr. Adriana Simas

## 2021-07-15 NOTE — Assessment & Plan Note (Signed)
BP stable. Continue Metoprolol & Triamterene/HCTZ.

## 2021-07-15 NOTE — Progress Notes (Signed)
Subjective:  Patient ID: Tracey Turner, female    DOB: 06-20-1955  Age: 66 y.o. MRN: [id number]  CC: Chief Complaint  Patient presents with   Diabetes    Sugars have been dropping to low 80s (around 84) a couple of times and she felt bad. Pt has had a couple of high numbers.    HPI:  66 year old female with HTN, GERD, DM-2, Morbid obesity, HLD presents for follow up.  DM-2 Recent A1C (done via a housecalls program) at the end of November was 7.6. Medications: Metformin 750 mg daily and Glipizide 5 mg BID. Patient reports recent "low" readings in the 80's.  HTN BP well controlled today. Medications: Triameterene/HCTZ, Metoprolol, PRN Lasix.  HLD Did not tolerate statin or Zetia.  Patient Active Problem List   Diagnosis Date Noted   Hyperlipidemia 04/15/2021   Low back pain 04/15/2021   Varicose veins of both legs with edema 02/02/2020   Ocular hypertension 12/05/2019   GERD (gastroesophageal reflux disease) 06/15/2014   Morbid obesity (HCC) 12/18/2013   Uncontrolled type 2 diabetes mellitus with hyperglycemia (HCC) 09/12/2012   Essential hypertension 09/12/2012   Arthritis of knee 02/23/2011    Social Hx   Social History   Socioeconomic History   Marital status: Single    Spouse name: Not on file   Number of children: Not on file   Years of education: some coll.   Highest education level: Not on file  Occupational History   Occupation: [employer]    Employer: [employer]  Tobacco Use   Smoking status: Former    Packs/day: 1.00    Years: 30.00    Pack years: 30.00    Types: Cigarettes    Start date: 07/22/1970    Quit date: 06/21/2007    Years since quitting: 14.0   Smokeless tobacco: Never  Vaping Use   Vaping Use: Never used  Substance and Sexual Activity   Alcohol use: No    Alcohol/week: 0.0 standard drinks   Drug use: No   Sexual activity: Not Currently    Birth control/protection: Post-menopausal  Other Topics Concern   Not on file  Social  History Narrative   Not on file   Social Determinants of Health   Financial Resource Strain: Not on file  Food Insecurity: Not on file  Transportation Needs: Not on file  Physical Activity: Not on file  Stress: Not on file  Social Connections: Not on file    Review of Systems  Constitutional: Negative.   Musculoskeletal:  Positive for arthralgias.   Objective:  BP 132/84    Pulse 76    Temp 98.7 F (37.1 C)    Wt (!) 303 lb 3.2 oz (137.5 kg)    SpO2 98%    BMI 48.94 kg/m   BP/Weight 07/15/2021 06/03/2021 04/15/2021  Systolic BP 132 160 139  Diastolic BP 84 82 86  Wt. (Lbs) 303.2 301 302.6  BMI 48.94 48.58 48.84    Physical Exam Vitals and nursing note reviewed.  Constitutional:      Appearance: Normal appearance. She is obese.  HENT:     Head: Normocephalic and atraumatic.  Cardiovascular:     Rate and Rhythm: Normal rate and regular rhythm.  Pulmonary:     Effort: Pulmonary effort is normal.     Breath sounds: Normal breath sounds. No wheezing or rales.  Neurological:     Mental Status: She is alert.  Psychiatric:        Mood  and Affect: Mood normal.        Behavior: Behavior normal.    Lab Results  Component Value Date   WBC 8.9 04/15/2021   HGB 12.8 04/15/2021   HCT 40.9 04/15/2021   PLT 303 04/15/2021   GLUCOSE 145 (H) 02/28/2021   CHOL 184 02/28/2021   TRIG 148 02/28/2021   HDL 58 02/28/2021   LDLCALC 100 (H) 02/28/2021   ALT 13 02/28/2021   AST 15 02/28/2021   NA 140 02/28/2021   K 4.6 02/28/2021   CL 99 02/28/2021   CREATININE 0.82 02/28/2021   BUN 9 02/28/2021   CO2 22 02/28/2021   TSH 0.725 06/03/2021   HGBA1C 8.8 (H) 02/28/2021   MICROALBUR 2.53 (H) 09/22/2013     Assessment & Plan:   Problem List Items Addressed This Visit       Cardiovascular and Mediastinum   Essential hypertension    BP stable. Continue Metoprolol & Triamterene/HCTZ.      Relevant Medications   metoprolol tartrate (LOPRESSOR) 50 MG tablet     Endocrine    Uncontrolled type 2 diabetes mellitus with hyperglycemia (HCC) - Primary    Control improving. Needs weight loss. Continue Glipizide and Metformin.      Relevant Medications   metFORMIN (GLUCOPHAGE XR) 750 MG 24 hr tablet   Other Relevant Orders   HgB A1c     Other   Hyperlipidemia    Did not tolerate statin or zetia. Will monitor closely.      Relevant Medications   metoprolol tartrate (LOPRESSOR) 50 MG tablet    Meds ordered this encounter  Medications   lidocaine (LIDODERM) 5 %    Sig: USE 1 PATCH EXTERNALLY ONCE DAILY. REMOVE AND DISCARD AFTER 12 HOURS OR AS DIRECTED BY MD. RETURN TO OFFICE IF STILL HAVING BACK PAIN    Dispense:  30 patch    Refill:  2   metoprolol tartrate (LOPRESSOR) 50 MG tablet    Sig: Take 1 tablet (50 mg total) by mouth 2 (two) times daily.    Dispense:  180 tablet    Refill:  1   metFORMIN (GLUCOPHAGE XR) 750 MG 24 hr tablet    Sig: Take one tablet po daily with food    Dispense:  90 tablet    Refill:  1    Follow-up:  3-6 months  Lebron Nauert Adriana Simas DO [facility]

## 2021-07-26 ENCOUNTER — Ambulatory Visit: Payer: BC Managed Care – PPO

## 2021-07-29 DIAGNOSIS — I83891 Varicose veins of right lower extremities with other complications: Secondary | ICD-10-CM | POA: Diagnosis not present

## 2021-07-29 DIAGNOSIS — M7989 Other specified soft tissue disorders: Secondary | ICD-10-CM | POA: Diagnosis not present

## 2021-08-10 DIAGNOSIS — E1165 Type 2 diabetes mellitus with hyperglycemia: Secondary | ICD-10-CM | POA: Diagnosis not present

## 2021-08-11 LAB — HEMOGLOBIN A1C
Est. average glucose Bld gHb Est-mCnc: 174 mg/dL
Hgb A1c MFr Bld: 7.7 % — ABNORMAL HIGH (ref 4.8–5.6)

## 2021-08-12 DIAGNOSIS — I83892 Varicose veins of left lower extremities with other complications: Secondary | ICD-10-CM | POA: Diagnosis not present

## 2021-08-12 DIAGNOSIS — M7989 Other specified soft tissue disorders: Secondary | ICD-10-CM | POA: Diagnosis not present

## 2021-08-19 ENCOUNTER — Other Ambulatory Visit: Payer: Self-pay

## 2021-08-19 ENCOUNTER — Ambulatory Visit (INDEPENDENT_AMBULATORY_CARE_PROVIDER_SITE_OTHER): Payer: Medicare Other | Admitting: Family Medicine

## 2021-08-19 DIAGNOSIS — E1165 Type 2 diabetes mellitus with hyperglycemia: Secondary | ICD-10-CM | POA: Diagnosis not present

## 2021-08-19 MED ORDER — SEMAGLUTIDE 3 MG PO TABS: 3.0000 mg | ORAL_TABLET | Freq: Every day | ORAL | 0 refills | Status: DC

## 2021-08-19 NOTE — Patient Instructions (Signed)
I have added Rybelsus. ? ?Follow up in 3 months. ? ?Take care ? ?Dr. Adriana Simas  ?

## 2021-08-22 NOTE — Progress Notes (Signed)
? ?Subjective:  ?Patient ID: Tracey Turner, female    DOB: 1956/04/11  Age: 66 y.o. MRN: [id number] ? ?CC: ?Chief Complaint  ?Patient presents with  ? discuss recent labs  ? ? ?HPI: ? ?66 year old female with hypertension, morbid obesity, type 2 diabetes, hyperlipidemia presents for follow-up regarding diabetes. ? ?Recent A1c was obtained on 2/22.  Glycemic control is improving but A1c remains elevated at 7.7.  Not at goal.  Patient is on metformin XR 750 mg as well as glipizide 5 mg twice daily.  She has not tolerated previous increases in metformin.  Additionally, experience adverse effects from Victoza.  She presents today to discuss additional treatment options to get her A1c at goal.  Patient states that she is feeling okay today.  She states that she has some neck pain.  She feels that she slept on it wrong.  No other complaints or concerns at this time. ? ?Patient Active Problem List  ? Diagnosis Date Noted  ? Hyperlipidemia 04/15/2021  ? Varicose veins of both legs with edema 02/02/2020  ? Ocular hypertension 12/05/2019  ? GERD (gastroesophageal reflux disease) 06/15/2014  ? Morbid obesity (HCC) 12/18/2013  ? Uncontrolled type 2 diabetes mellitus with hyperglycemia (HCC) 09/12/2012  ? Essential hypertension 09/12/2012  ? Arthritis of knee 02/23/2011  ? ? ?Social Hx   ?Social History  ? ?Socioeconomic History  ? Marital status: Single  ?  Spouse name: Not on file  ? Number of children: Not on file  ? Years of education: some coll.  ? Highest education level: Not on file  ?Occupational History  ? Occupation: [employer]  ?  Employer: [employer]  ?Tobacco Use  ? Smoking status: Former  ?  Packs/day: 1.00  ?  Years: 30.00  ?  Pack years: 30.00  ?  Types: Cigarettes  ?  Start date: 07/22/1970  ?  Quit date: 06/21/2007  ?  Years since quitting: 14.1  ? Smokeless tobacco: Never  ?Vaping Use  ? Vaping Use: Never used  ?Substance and Sexual Activity  ? Alcohol use: No  ?  Alcohol/week: 0.0 standard drinks  ? Drug use:  No  ? Sexual activity: Not Currently  ?  Birth control/protection: Post-menopausal  ?Other Topics Concern  ? Not on file  ?Social History Narrative  ? Not on file  ? ?Social Determinants of Health  ? ?Financial Resource Strain: Not on file  ?Food Insecurity: Not on file  ?Transportation Needs: Not on file  ?Physical Activity: Not on file  ?Stress: Not on file  ?Social Connections: Not on file  ? ? ?Review of Systems  ?Constitutional: Negative.   ?Musculoskeletal:  Positive for neck pain.  ? ?Objective:  ?BP (!) 159/76   Pulse 68   Temp 98.6 ?F (37 ?C) (Oral)   Ht 5\' 6"  (1.676 m)   Wt 299 lb (135.6 kg)   SpO2 97%   BMI 48.26 kg/m?  ? ?BP/Weight 08/19/2021 07/15/2021 06/03/2021  ?Systolic BP 159 132 160  ?Diastolic BP 76 84 82  ?Wt. (Lbs) 299 303.2 301  ?BMI 48.26 48.94 48.58  ? ? ?Physical Exam ?Vitals and nursing note reviewed.  ?Constitutional:   ?   General: She is not in acute distress. ?   Appearance: Normal appearance. She is obese.  ?HENT:  ?   Head: Normocephalic and atraumatic.  ?Eyes:  ?   General:     ?   Right eye: No discharge.     ?   Left eye:  No discharge.  ?   Conjunctiva/sclera: Conjunctivae normal.  ?Cardiovascular:  ?   Rate and Rhythm: Normal rate and regular rhythm.  ?Pulmonary:  ?   Effort: Pulmonary effort is normal.  ?   Breath sounds: Normal breath sounds.  ?Neurological:  ?   Mental Status: She is alert.  ?Psychiatric:     ?   Mood and Affect: Mood normal.     ?   Behavior: Behavior normal.  ? ? ?Lab Results  ?Component Value Date  ? WBC 8.9 04/15/2021  ? HGB 12.8 04/15/2021  ? HCT 40.9 04/15/2021  ? PLT 303 04/15/2021  ? GLUCOSE 145 (H) 02/28/2021  ? CHOL 184 02/28/2021  ? TRIG 148 02/28/2021  ? HDL 58 02/28/2021  ? LDLCALC 100 (H) 02/28/2021  ? ALT 13 02/28/2021  ? AST 15 02/28/2021  ? NA 140 02/28/2021  ? K 4.6 02/28/2021  ? CL 99 02/28/2021  ? CREATININE 0.82 02/28/2021  ? BUN 9 02/28/2021  ? CO2 22 02/28/2021  ? TSH 0.725 06/03/2021  ? HGBA1C 7.7 (H) 08/10/2021  ? MICROALBUR 2.53  (H) 09/22/2013  ? ? ? ?Assessment & Plan:  ? ?Problem List Items Addressed This Visit   ? ?  ? Endocrine  ? Uncontrolled type 2 diabetes mellitus with hyperglycemia (HCC)  ?  Improving but uncontrolled.  Continue metformin and glipizide.  Adding Rybelsus. ?  ?  ? Relevant Medications  ? Semaglutide 3 MG TABS  ? ? ?Meds ordered this encounter  ?Medications  ? Semaglutide 3 MG TABS  ?  Sig: Take 3 mg by mouth daily.  ?  Dispense:  30 tablet  ?  Refill:  0  ? ? ?Follow-up:  Return in about 3 months (around 11/19/2021). ? ?Everlene Other DO ?[facility] ? ?

## 2021-08-22 NOTE — Assessment & Plan Note (Signed)
Improving but uncontrolled.  Continue metformin and glipizide.  Adding Rybelsus. ?

## 2021-10-09 ENCOUNTER — Other Ambulatory Visit: Payer: Self-pay | Admitting: Family Medicine

## 2021-10-14 ENCOUNTER — Ambulatory Visit: Payer: Medicare Other | Admitting: Family Medicine

## 2021-10-19 ENCOUNTER — Other Ambulatory Visit: Payer: Self-pay | Admitting: Family Medicine

## 2021-10-21 DIAGNOSIS — I83891 Varicose veins of right lower extremities with other complications: Secondary | ICD-10-CM | POA: Diagnosis not present

## 2021-11-09 DIAGNOSIS — I83891 Varicose veins of right lower extremities with other complications: Secondary | ICD-10-CM | POA: Diagnosis not present

## 2021-11-09 DIAGNOSIS — I87391 Chronic venous hypertension (idiopathic) with other complications of right lower extremity: Secondary | ICD-10-CM | POA: Diagnosis not present

## 2021-11-15 DIAGNOSIS — H40053 Ocular hypertension, bilateral: Secondary | ICD-10-CM | POA: Diagnosis not present

## 2021-11-24 DIAGNOSIS — I83892 Varicose veins of left lower extremities with other complications: Secondary | ICD-10-CM | POA: Diagnosis not present

## 2021-11-25 ENCOUNTER — Ambulatory Visit (INDEPENDENT_AMBULATORY_CARE_PROVIDER_SITE_OTHER): Payer: Medicare Other | Admitting: Family Medicine

## 2021-11-25 VITALS — BP 132/84 | HR 62 | Temp 97.2°F | Ht 66.0 in | Wt 305.0 lb

## 2021-11-25 DIAGNOSIS — G8929 Other chronic pain: Secondary | ICD-10-CM

## 2021-11-25 DIAGNOSIS — L819 Disorder of pigmentation, unspecified: Secondary | ICD-10-CM | POA: Diagnosis not present

## 2021-11-25 DIAGNOSIS — E7800 Pure hypercholesterolemia, unspecified: Secondary | ICD-10-CM | POA: Diagnosis not present

## 2021-11-25 DIAGNOSIS — I10 Essential (primary) hypertension: Secondary | ICD-10-CM | POA: Diagnosis not present

## 2021-11-25 DIAGNOSIS — E1165 Type 2 diabetes mellitus with hyperglycemia: Secondary | ICD-10-CM | POA: Diagnosis not present

## 2021-11-25 DIAGNOSIS — M545 Low back pain, unspecified: Secondary | ICD-10-CM | POA: Insufficient documentation

## 2021-11-25 DIAGNOSIS — Z862 Personal history of diseases of the blood and blood-forming organs and certain disorders involving the immune mechanism: Secondary | ICD-10-CM | POA: Diagnosis not present

## 2021-11-25 MED ORDER — PREDNISONE 10 MG (21) PO TBPK: ORAL_TABLET | ORAL | 0 refills | Status: DC

## 2021-11-25 MED ORDER — LIDOCAINE 5 % EX PTCH: MEDICATED_PATCH | CUTANEOUS | 4 refills | Status: DC

## 2021-11-25 MED ORDER — FUROSEMIDE 20 MG PO TABS: ORAL_TABLET | ORAL | 0 refills | Status: DC

## 2021-11-25 NOTE — Assessment & Plan Note (Signed)
Worsening.  Treating with short course of prednisone.

## 2021-11-25 NOTE — Assessment & Plan Note (Signed)
A1c today.  Continue metformin and glipizide.  If A1c is still not at goal we will try Trulicity.

## 2021-11-25 NOTE — Assessment & Plan Note (Signed)
Stable. Continue metoprolol and triamterene/HCTZ. 

## 2021-11-25 NOTE — Progress Notes (Signed)
Subjective:  Patient ID: Tracey Turner, female    DOB: Apr 07, 1956  Age: 66 y.o. MRN: [id number]  CC: Chief Complaint  Patient presents with   Diabetes    Had recent visit from insurance [employer] for medicare wellness- needs physical    Hypertension    HPI:  66 year old female with morbid obesity, hypertension, GERD, type 2 diabetes, hyperlipidemia presents for follow-up.  Patient's most recent A1c was 7.7.  She was started on Rybelsus.  However patient did not take the medication due to possible side effects.  She remains on metformin and glipizide.  Patient is not interested in taking semaglutide.  She states that she will consider taking Trulicity if she needs to.  Hypertension is stable on triamterene/HCTZ and metoprolol.  Patient reports ongoing low back pain.  She states that she has previously had a disc issue years ago and that her back pain flares periodically.  She reports left-sided low back pain worsening over the past few weeks.  Worse with certain activity/range of motion.  Patient states that she has responded well to prednisone in the past.  Patient also reports hyperpigmentation on her face.  She is interested in getting this addressed.  Patient Active Problem List   Diagnosis Date Noted   Low back pain 11/25/2021   Hyperpigmentation 11/25/2021   Hyperlipidemia 04/15/2021   Varicose veins of both legs with edema 02/02/2020   Ocular hypertension 12/05/2019   GERD (gastroesophageal reflux disease) 06/15/2014   Morbid obesity (HCC) 12/18/2013   Uncontrolled type 2 diabetes mellitus with hyperglycemia (HCC) 09/12/2012   Essential hypertension 09/12/2012   Arthritis of knee 02/23/2011    Social Hx   Social History   Socioeconomic History   Marital status: Single    Spouse name: Not on file   Number of children: Not on file   Years of education: some coll.   Highest education level: Not on file  Occupational History   Occupation: [employer] [place]     Employer: [employer]  Tobacco Use   Smoking status: Former    Packs/day: 1.00    Years: 30.00    Total pack years: 30.00    Types: Cigarettes    Start date: 07/22/1970    Quit date: 06/21/2007    Years since quitting: 14.4   Smokeless tobacco: Never  Vaping Use   Vaping Use: Never used  Substance and Sexual Activity   Alcohol use: No    Alcohol/week: 0.0 standard drinks of alcohol   Drug use: No   Sexual activity: Not Currently    Birth control/protection: Post-menopausal  Other Topics Concern   Not on file  Social History Narrative   Not on file   Social Determinants of Health   Financial Resource Strain: Not on file  Food Insecurity: Not on file  Transportation Needs: Not on file  Physical Activity: Not on file  Stress: Not on file  Social Connections: Not on file    Review of Systems Per HPI  Objective:  BP 132/84   Pulse 62   Temp (!) 97.2 F (36.2 C)   Ht 5\' 6"  (1.676 m)   Wt (!) 305 lb (138.3 kg)   SpO2 97%   BMI 49.23 kg/m      11/25/2021   10:18 AM 11/25/2021    9:35 AM 08/19/2021   10:11 AM  BP/Weight  Systolic BP 132 175 159  Diastolic BP 84 75 76  Wt. (Lbs)  305 299  BMI  49.23 kg/m2 48.26  kg/m2    Physical Exam Constitutional:      General: She is not in acute distress.    Appearance: She is obese.  HENT:     Head: Normocephalic and atraumatic.     Comments: Areas of hyperpigmentation noted on the face. Cardiovascular:     Rate and Rhythm: Normal rate and regular rhythm.  Pulmonary:     Effort: Pulmonary effort is normal.     Breath sounds: Normal breath sounds. No wheezing, rhonchi or rales.  Musculoskeletal:     Comments: Left low back with tenderness to palpation.  Neurological:     Mental Status: She is alert.  Psychiatric:     Comments: Flat affect     Lab Results  Component Value Date   WBC 8.9 04/15/2021   HGB 12.8 04/15/2021   HCT 40.9 04/15/2021   PLT 303 04/15/2021   GLUCOSE 145 (H) 02/28/2021   CHOL 184 02/28/2021    TRIG 148 02/28/2021   HDL 58 02/28/2021   LDLCALC 100 (H) 02/28/2021   ALT 13 02/28/2021   AST 15 02/28/2021   NA 140 02/28/2021   K 4.6 02/28/2021   CL 99 02/28/2021   CREATININE 0.82 02/28/2021   BUN 9 02/28/2021   CO2 22 02/28/2021   TSH 0.725 06/03/2021   HGBA1C 7.7 (H) 08/10/2021   MICROALBUR 2.53 (H) 09/22/2013     Assessment & Plan:   Problem List Items Addressed This Visit       Cardiovascular and Mediastinum   Essential hypertension    Stable.  Continue metoprolol and triamterene/HCTZ.      Relevant Medications   furosemide (LASIX) 20 MG tablet     Endocrine   Uncontrolled type 2 diabetes mellitus with hyperglycemia (HCC) - Primary    A1c today.  Continue metformin and glipizide.  If A1c is still not at goal we will try Trulicity.      Relevant Orders   CMP14+EGFR   Hemoglobin A1c   Microalbumin / creatinine urine ratio     Musculoskeletal and Integument   Hyperpigmentation    Referring to dermatology.        Other   Hyperlipidemia   Relevant Medications   furosemide (LASIX) 20 MG tablet   Other Relevant Orders   Lipid panel   Low back pain    Worsening.  Treating with short course of prednisone.      Relevant Medications   predniSONE (STERAPRED UNI-PAK 21 TAB) 10 MG (21) TBPK tablet   Other Visit Diagnoses     History of anemia       Relevant Orders   CBC       Meds ordered this encounter  Medications   furosemide (LASIX) 20 MG tablet    Sig: TAKE 1 TABLET BY MOUTH ONCE DAILY AS NEEDED FOR EDEMA    Dispense:  30 tablet    Refill:  0   lidocaine (LIDODERM) 5 %    Sig: Remove & Discard patch within 12 hours or as directed by MD    Dispense:  30 patch    Refill:  4   predniSONE (STERAPRED UNI-PAK 21 TAB) 10 MG (21) TBPK tablet    Sig: 6 tablets on day 1; decrease by 1 tablet daily until gone.    Dispense:  21 tablet    Refill:  0    Follow-up:  Return in about 3 months (around 02/25/2022) for Follow up Chronic medical  issues.  Tracey Turner [facility]

## 2021-11-25 NOTE — Assessment & Plan Note (Signed)
Referring to dermatology. 

## 2021-11-25 NOTE — Patient Instructions (Signed)
Labs today.  Prednisone as prescribed.  Follow up in 3 months.

## 2021-11-27 LAB — CBC
Hematocrit: 39.3 % (ref 34.0–46.6)
Hemoglobin: 12.5 g/dL (ref 11.1–15.9)
MCH: 27.5 pg (ref 26.6–33.0)
MCHC: 31.8 g/dL (ref 31.5–35.7)
MCV: 86 fL (ref 79–97)
Platelets: 285 10*3/uL (ref 150–450)
RBC: 4.55 x10E6/uL (ref 3.77–5.28)
RDW: 14.9 % (ref 11.7–15.4)
WBC: 7.6 10*3/uL (ref 3.4–10.8)

## 2021-11-27 LAB — CMP14+EGFR
ALT: 18 IU/L (ref 0–32)
AST: 21 IU/L (ref 0–40)
Albumin/Globulin Ratio: 1.2 (ref 1.2–2.2)
Albumin: 4.4 g/dL (ref 3.8–4.8)
Alkaline Phosphatase: 81 IU/L (ref 44–121)
BUN/Creatinine Ratio: 15 (ref 12–28)
BUN: 10 mg/dL (ref 8–27)
Bilirubin Total: 0.2 mg/dL (ref 0.0–1.2)
CO2: 25 mmol/L (ref 20–29)
Calcium: 9 mg/dL (ref 8.7–10.3)
Chloride: 104 mmol/L (ref 96–106)
Creatinine, Ser: 0.66 mg/dL (ref 0.57–1.00)
Globulin, Total: 3.6 g/dL (ref 1.5–4.5)
Glucose: 138 mg/dL — ABNORMAL HIGH (ref 70–99)
Potassium: 4.5 mmol/L (ref 3.5–5.2)
Sodium: 144 mmol/L (ref 134–144)
Total Protein: 8 g/dL (ref 6.0–8.5)
eGFR: 96 mL/min/{1.73_m2} (ref >59)

## 2021-11-27 LAB — LIPID PANEL
Chol/HDL Ratio: 2.5 ratio (ref 0.0–4.4)
Cholesterol, Total: 147 mg/dL (ref 100–199)
HDL: 58 mg/dL (ref >39)
LDL Chol Calc (NIH): 66 mg/dL (ref 0–99)
Triglycerides: 130 mg/dL (ref 0–149)
VLDL Cholesterol Cal: 23 mg/dL (ref 5–40)

## 2021-11-27 LAB — HEMOGLOBIN A1C
Est. average glucose Bld gHb Est-mCnc: 177 mg/dL
Hgb A1c MFr Bld: 7.8 % — ABNORMAL HIGH (ref 4.8–5.6)

## 2021-11-27 LAB — MICROALBUMIN / CREATININE URINE RATIO
Creatinine, Urine: 87.6 mg/dL
Microalb/Creat Ratio: 128 mg/g creat — ABNORMAL HIGH (ref 0–29)
Microalbumin, Urine: 112.3 ug/mL

## 2021-11-28 ENCOUNTER — Other Ambulatory Visit: Payer: Self-pay | Admitting: Family Medicine

## 2021-11-28 MED ORDER — TRULICITY 0.75 MG/0.5ML ~~LOC~~ SOAJ: 0.7500 mg | SUBCUTANEOUS | 0 refills | Status: DC

## 2021-12-03 ENCOUNTER — Other Ambulatory Visit: Payer: Self-pay | Admitting: Family Medicine

## 2021-12-13 ENCOUNTER — Telehealth: Payer: Self-pay | Admitting: Family Medicine

## 2021-12-13 NOTE — Telephone Encounter (Signed)
Patient had FMLA faxed over on 6/27 and needing it updated last seen 6/9. In your box for completion.

## 2021-12-14 NOTE — Telephone Encounter (Signed)
Pt called into office and states that has always had FMLA. Pt has to have FMLA for doctors appts otherwise she will get wrote up and fired. Pt states she has appts with PCP, eye doctor and leg doctor. Last FMLA on file from [facility] 12/08/20. Please advise. Thank you

## 2021-12-14 NOTE — Telephone Encounter (Signed)
Sent mychart message to patient

## 2021-12-15 ENCOUNTER — Other Ambulatory Visit: Payer: Self-pay | Admitting: Family Medicine

## 2021-12-16 ENCOUNTER — Other Ambulatory Visit: Payer: Self-pay | Admitting: Family Medicine

## 2021-12-16 NOTE — Telephone Encounter (Signed)
Patient wishes to stay on 0.75 mg.  She said she tried a higher dose and it made her sick.

## 2021-12-22 ENCOUNTER — Other Ambulatory Visit: Payer: Self-pay | Admitting: Family Medicine

## 2021-12-22 DIAGNOSIS — L818 Other specified disorders of pigmentation: Secondary | ICD-10-CM | POA: Diagnosis not present

## 2021-12-23 DIAGNOSIS — I83892 Varicose veins of left lower extremities with other complications: Secondary | ICD-10-CM | POA: Diagnosis not present

## 2021-12-23 DIAGNOSIS — G8929 Other chronic pain: Secondary | ICD-10-CM | POA: Diagnosis not present

## 2021-12-23 DIAGNOSIS — I83812 Varicose veins of left lower extremities with pain: Secondary | ICD-10-CM | POA: Diagnosis not present

## 2021-12-23 DIAGNOSIS — M25561 Pain in right knee: Secondary | ICD-10-CM | POA: Diagnosis not present

## 2021-12-23 DIAGNOSIS — M7989 Other specified soft tissue disorders: Secondary | ICD-10-CM | POA: Diagnosis not present

## 2021-12-31 ENCOUNTER — Other Ambulatory Visit: Payer: Self-pay | Admitting: Family Medicine

## 2022-01-08 ENCOUNTER — Other Ambulatory Visit: Payer: Self-pay | Admitting: Family Medicine

## 2022-01-10 ENCOUNTER — Other Ambulatory Visit: Payer: Self-pay | Admitting: Family Medicine

## 2022-01-19 ENCOUNTER — Other Ambulatory Visit: Payer: Self-pay | Admitting: Family Medicine

## 2022-02-09 ENCOUNTER — Other Ambulatory Visit: Payer: Self-pay | Admitting: Family Medicine

## 2022-02-24 ENCOUNTER — Ambulatory Visit: Payer: Self-pay | Admitting: Family Medicine

## 2022-03-02 ENCOUNTER — Other Ambulatory Visit: Payer: Self-pay | Admitting: Family Medicine

## 2022-03-02 ENCOUNTER — Ambulatory Visit (INDEPENDENT_AMBULATORY_CARE_PROVIDER_SITE_OTHER): Payer: Medicare Other | Admitting: Family Medicine

## 2022-03-02 VITALS — BP 148/78 | HR 76 | Temp 98.4°F | Ht 66.0 in | Wt 309.0 lb

## 2022-03-02 DIAGNOSIS — G8929 Other chronic pain: Secondary | ICD-10-CM | POA: Diagnosis not present

## 2022-03-02 DIAGNOSIS — E1165 Type 2 diabetes mellitus with hyperglycemia: Secondary | ICD-10-CM | POA: Diagnosis not present

## 2022-03-02 DIAGNOSIS — M545 Low back pain, unspecified: Secondary | ICD-10-CM | POA: Diagnosis not present

## 2022-03-02 DIAGNOSIS — Z1211 Encounter for screening for malignant neoplasm of colon: Secondary | ICD-10-CM

## 2022-03-02 DIAGNOSIS — Z23 Encounter for immunization: Secondary | ICD-10-CM

## 2022-03-02 DIAGNOSIS — I10 Essential (primary) hypertension: Secondary | ICD-10-CM

## 2022-03-02 MED ORDER — LIDOCAINE 5 % EX PTCH: MEDICATED_PATCH | CUTANEOUS | 4 refills | Status: DC

## 2022-03-02 MED ORDER — FREESTYLE LIBRE 3 SENSOR MISC: 6 refills | Status: DC

## 2022-03-02 MED ORDER — TRULICITY 0.75 MG/0.5ML ~~LOC~~ SOAJ: SUBCUTANEOUS | 0 refills | Status: DC

## 2022-03-02 NOTE — Progress Notes (Signed)
Subjective:  Patient ID: Tracey Turner, female    DOB: 10/10/1955  Age: 66 y.o. MRN: [id number]  CC: Chief Complaint  Patient presents with   Diabetes    Type 2 follow up , request prescription for continuous glucose monitor and BP machine     HPI:  66 year old female with a PMH of hypertension, morbid obesity, GERD, type 2 diabetes, hyperlipidemia presents for follow-up.  DM 2 Patient is tolerating Trulicity.  Needs refill.  She remains on metformin and glipizide.  No hypoglycemia.  Patient is requesting a continuous glucometer.  Hypertension BP elevated today.  Has been previously at goal.  She is currently on triamterene HCTZ and metoprolol.  Uses Lasix as needed.  Has had a severe reaction to ACE inhibitor.  Does not tolerate calcium channel blocker due to edema.  Low back pain Patient needs refill on lidocaine patches.  Preventative health care Patient states that this time for her to schedule her mammogram.  She is due for colonoscopy.  We will place referral.  Patient Active Problem List   Diagnosis Date Noted   Low back pain 11/25/2021   Hyperpigmentation 11/25/2021   Hyperlipidemia 04/15/2021   Varicose veins of both legs with edema 02/02/2020   Ocular hypertension 12/05/2019   GERD (gastroesophageal reflux disease) 06/15/2014   Morbid obesity (HCC) 12/18/2013   Uncontrolled type 2 diabetes mellitus with hyperglycemia (HCC) 09/12/2012   Essential hypertension 09/12/2012   Arthritis of knee 02/23/2011    Social Hx   Social History   Socioeconomic History   Marital status: Single    Spouse name: Not on file   Number of children: Not on file   Years of education: some coll.   Highest education level: Not on file  Occupational History   Occupation: [employer] [place]    Employer: [employer]  Tobacco Use   Smoking status: Former    Packs/day: 1.00    Years: 30.00    Total pack years: 30.00    Types: Cigarettes    Start date: 07/22/1970    Quit date:  06/21/2007    Years since quitting: 14.7   Smokeless tobacco: Never  Vaping Use   Vaping Use: Never used  Substance and Sexual Activity   Alcohol use: No    Alcohol/week: 0.0 standard drinks of alcohol   Drug use: No   Sexual activity: Not Currently    Birth control/protection: Post-menopausal  Other Topics Concern   Not on file  Social History Narrative   Not on file   Social Determinants of Health   Financial Resource Strain: Not on file  Food Insecurity: Not on file  Transportation Needs: Not on file  Physical Activity: Not on file  Stress: Not on file  Social Connections: Not on file    Review of Systems Per HPI  Objective:  BP (!) 148/78   Pulse 76   Temp 98.4 F (36.9 C)   Ht 5\' 6"  (1.676 m)   Wt (!) 309 lb (140.2 kg)   SpO2 97%   BMI 49.87 kg/m      03/02/2022    1:36 PM 03/02/2022    1:11 PM 11/25/2021   10:18 AM  BP/Weight  Systolic BP 148 152 132  Diastolic BP 78 78 84  Wt. (Lbs)  309   BMI  49.87 kg/m2     Physical Exam Vitals and nursing note reviewed.  Constitutional:      Appearance: Normal appearance. She is obese.  HENT:  Head: Normocephalic and atraumatic.  Eyes:     General:        Right eye: No discharge.        Left eye: No discharge.     Conjunctiva/sclera: Conjunctivae normal.  Cardiovascular:     Rate and Rhythm: Normal rate and regular rhythm.     Comments: 2+ lower extremity edema. Pulmonary:     Effort: Pulmonary effort is normal.     Breath sounds: Normal breath sounds. No wheezing, rhonchi or rales.  Neurological:     Mental Status: She is alert.  Psychiatric:        Mood and Affect: Mood normal.        Behavior: Behavior normal.     Lab Results  Component Value Date   WBC 7.6 11/25/2021   HGB 12.5 11/25/2021   HCT 39.3 11/25/2021   PLT 285 11/25/2021   GLUCOSE 138 (H) 11/25/2021   CHOL 147 11/25/2021   TRIG 130 11/25/2021   HDL 58 11/25/2021   LDLCALC 66 11/25/2021   ALT 18 11/25/2021   AST 21  11/25/2021   NA 144 11/25/2021   K 4.5 11/25/2021   CL 104 11/25/2021   CREATININE 0.66 11/25/2021   BUN 10 11/25/2021   CO2 25 11/25/2021   TSH 0.725 06/03/2021   HGBA1C 7.8 (H) 11/25/2021   MICROALBUR 2.53 (H) 09/22/2013     Assessment & Plan:   Problem List Items Addressed This Visit       Cardiovascular and Mediastinum   Essential hypertension    Blood pressure has been well controlled.  BP mildly elevated today.  Patient is intolerant of calcium channel blocker, ACE inhibitor.  Reluctant to add ARB given severe reaction to ACE inhibitor.  Patient will monitor her blood pressures at home we will add additional medication if BP remains elevated greater than 140/90.        Endocrine   Uncontrolled type 2 diabetes mellitus with hyperglycemia (HCC) - Primary    A1c today to assess.  Recent addition of Trulicity.  Hopefully her A1c will be at goal.  I have refilled her Trulicity today.  She may need dose increase if her A1c is elevated. Patient requested CGM.  Rx sent for freestyle libre 3.      Relevant Medications   Continuous Blood Gluc Sensor (FREESTYLE LIBRE 3 SENSOR) MISC   Dulaglutide (TRULICITY) 0.75 MG/0.5ML SOPN   Other Relevant Orders   Hemoglobin A1c     Other   Low back pain    Lidoderm patches refilled.      Other Visit Diagnoses     Colon cancer screening       Relevant Orders   Ambulatory referral to Gastroenterology   Immunization due       Relevant Orders   Flu Vaccine QUAD 4mo+IM (Fluarix, Fluzone & Alfiuria Quad PF) (Completed)       Meds ordered this encounter  Medications   Continuous Blood Gluc Sensor (FREESTYLE LIBRE 3 SENSOR) MISC    Sig: Place every 14 days as directed.    Dispense:  2 each    Refill:  6   lidocaine (LIDODERM) 5 %    Sig: Remove & Discard patch within 12 hours or as directed by MD    Dispense:  30 patch    Refill:  4   Dulaglutide (TRULICITY) 0.75 MG/0.5ML SOPN    Sig: INJECT 0.75 MG  SUBCUTANEOUSLY ONCE A WEEK     Dispense:  4 mL  Refill:  0    Follow-up:  Return in about 3 months (around 06/01/2022).  Everlene Other DO [facility]

## 2022-03-02 NOTE — Assessment & Plan Note (Signed)
A1c today to assess.  Recent addition of Trulicity.  Hopefully her A1c will be at goal.  I have refilled her Trulicity today.  She may need dose increase if her A1c is elevated. Patient requested CGM.  Rx sent for freestyle libre 3.

## 2022-03-02 NOTE — Patient Instructions (Addendum)
Lab today.  I have placed referral to GI.  Keep an eye on your BP readings at home.  Follow up in 3 months.

## 2022-03-02 NOTE — Assessment & Plan Note (Signed)
Lidoderm patches refilled.

## 2022-03-02 NOTE — Assessment & Plan Note (Signed)
Blood pressure has been well controlled.  BP mildly elevated today.  Patient is intolerant of calcium channel blocker, ACE inhibitor.  Reluctant to add ARB given severe reaction to ACE inhibitor.  Patient will monitor her blood pressures at home we will add additional medication if BP remains elevated greater than 140/90.

## 2022-03-03 ENCOUNTER — Telehealth: Payer: Self-pay | Admitting: Family Medicine

## 2022-03-03 ENCOUNTER — Other Ambulatory Visit: Payer: Self-pay | Admitting: Family Medicine

## 2022-03-03 ENCOUNTER — Encounter: Payer: Self-pay | Admitting: *Deleted

## 2022-03-03 ENCOUNTER — Telehealth: Payer: Self-pay

## 2022-03-03 LAB — HEMOGLOBIN A1C
Est. average glucose Bld gHb Est-mCnc: 174 mg/dL
Hgb A1c MFr Bld: 7.7 % — ABNORMAL HIGH (ref 4.8–5.6)

## 2022-03-03 MED ORDER — CYCLOBENZAPRINE HCL 10 MG PO TABS: 10.0000 mg | ORAL_TABLET | Freq: Three times a day (TID) | ORAL | 1 refills | Status: DC | PRN

## 2022-03-03 MED ORDER — TRAMADOL HCL 50 MG PO TABS: 50.0000 mg | ORAL_TABLET | Freq: Two times a day (BID) | ORAL | 1 refills | Status: DC | PRN

## 2022-03-03 NOTE — Telephone Encounter (Signed)
Patient is requesting a different medication she can't afford trulicity.She is also requesting flexiril and tramadol. She uses [facility] in [place].  CB# [phone]

## 2022-03-03 NOTE — Telephone Encounter (Signed)
Caller name:Bonney Leitz   On DPR? :Yes  Call back [phone]  Provider they see: [place]   Reason for call:Medication that she spoke to [place] about insurance is not going to cover

## 2022-03-03 NOTE — Telephone Encounter (Signed)
Patient informs her insurance will not cover any of the GLP-1 medications they all cost about $120 and she not able to afford these. Patient is requesting flexeril and tramadol refills as well.

## 2022-03-03 NOTE — Telephone Encounter (Signed)
Patient request a more affordable medication to replace trulicity , it's not affordable .

## 2022-03-03 NOTE — Telephone Encounter (Signed)
Spoke with patient and informed per drs notes. 

## 2022-03-06 ENCOUNTER — Other Ambulatory Visit: Payer: Self-pay | Admitting: Family Medicine

## 2022-03-06 MED ORDER — GLIPIZIDE 10 MG PO TABS: 10.0000 mg | ORAL_TABLET | Freq: Two times a day (BID) | ORAL | 1 refills | Status: DC

## 2022-03-06 MED ORDER — FREESTYLE LIBRE 2 SENSOR MISC: 11 refills | Status: DC

## 2022-03-06 MED ORDER — FREESTYLE LIBRE 2 READER DEVI: 0 refills | Status: DC

## 2022-03-06 NOTE — Telephone Encounter (Signed)
Patient states she would not be able to tolerate metformin , but glipizide would be fine to increase. She states [facility] said they sent something saying they need more information for CGM . Have not received this .

## 2022-03-08 NOTE — Telephone Encounter (Signed)
Patient notified and verbalized understanding. 

## 2022-03-08 NOTE — Telephone Encounter (Signed)
Coral Spikes, DO     03/06/22 10:40 PM New rx for glipizide sent. 10 mg twice daily.  New CGM sent; insurance wanted Freestyle Libre 2.

## 2022-03-08 NOTE — Telephone Encounter (Signed)
Left message for patient to return the call for additional details and recommendations.   

## 2022-03-10 ENCOUNTER — Other Ambulatory Visit (HOSPITAL_COMMUNITY): Payer: Self-pay | Admitting: Family Medicine

## 2022-03-10 ENCOUNTER — Other Ambulatory Visit: Payer: Self-pay | Admitting: Family Medicine

## 2022-03-10 DIAGNOSIS — Z1231 Encounter for screening mammogram for malignant neoplasm of breast: Secondary | ICD-10-CM

## 2022-03-16 ENCOUNTER — Telehealth: Payer: Self-pay | Admitting: Family Medicine

## 2022-03-16 NOTE — Telephone Encounter (Signed)
  Left message for patient to call back and schedule Medicare Annual Wellness Visit (AWV).  Please offer to do virtually or by telephone.  No hx of AWV eligible for AWVI per [facility] as of 12/17/2021  Please schedule at any time with [facility]Nurse Health Advisor.      45 minute appointment   Any questions, please call me at [phone]

## 2022-03-17 NOTE — Telephone Encounter (Signed)
Patient called back - She work Monday -Thursday until 5:00 each day.    She told me that if she has a day off - she will call and see if we can schedule to do her AWVI on that day

## 2022-03-20 ENCOUNTER — Ambulatory Visit (HOSPITAL_COMMUNITY)
Admission: RE | Admit: 2022-03-20 | Discharge: 2022-03-20 | Disposition: A | Discharge status: Home / Self Care | Payer: Medicare Other | Source: Ambulatory Visit | Attending: Family Medicine | Admitting: Family Medicine

## 2022-03-20 DIAGNOSIS — Z1231 Encounter for screening mammogram for malignant neoplasm of breast: Secondary | ICD-10-CM | POA: Insufficient documentation

## 2022-03-30 DIAGNOSIS — L818 Other specified disorders of pigmentation: Secondary | ICD-10-CM | POA: Diagnosis not present

## 2022-03-31 ENCOUNTER — Other Ambulatory Visit: Payer: Self-pay | Admitting: Family Medicine

## 2022-04-05 ENCOUNTER — Other Ambulatory Visit: Payer: Self-pay | Admitting: Family Medicine

## 2022-04-05 DIAGNOSIS — I83892 Varicose veins of left lower extremities with other complications: Secondary | ICD-10-CM | POA: Diagnosis not present

## 2022-04-24 ENCOUNTER — Other Ambulatory Visit: Payer: Self-pay | Admitting: Family Medicine

## 2022-05-14 ENCOUNTER — Other Ambulatory Visit: Payer: Self-pay

## 2022-05-14 ENCOUNTER — Emergency Department (HOSPITAL_COMMUNITY)
Admission: EM | Admit: 2022-05-14 | Discharge: 2022-05-14 | Disposition: A | Discharge status: Home / Self Care | Payer: Medicare Other | Attending: Emergency Medicine | Admitting: Emergency Medicine

## 2022-05-14 ENCOUNTER — Encounter (HOSPITAL_COMMUNITY): Payer: Self-pay | Admitting: *Deleted

## 2022-05-14 DIAGNOSIS — I10 Essential (primary) hypertension: Secondary | ICD-10-CM | POA: Insufficient documentation

## 2022-05-14 DIAGNOSIS — Z1152 Encounter for screening for COVID-19: Secondary | ICD-10-CM | POA: Insufficient documentation

## 2022-05-14 DIAGNOSIS — J111 Influenza due to unidentified influenza virus with other respiratory manifestations: Secondary | ICD-10-CM | POA: Insufficient documentation

## 2022-05-14 DIAGNOSIS — Z79899 Other long term (current) drug therapy: Secondary | ICD-10-CM | POA: Insufficient documentation

## 2022-05-14 DIAGNOSIS — Z7984 Long term (current) use of oral hypoglycemic drugs: Secondary | ICD-10-CM | POA: Insufficient documentation

## 2022-05-14 DIAGNOSIS — E119 Type 2 diabetes mellitus without complications: Secondary | ICD-10-CM | POA: Insufficient documentation

## 2022-05-14 DIAGNOSIS — R059 Cough, unspecified: Secondary | ICD-10-CM | POA: Diagnosis present

## 2022-05-14 LAB — RESP PANEL BY RT-PCR (FLU A&B, COVID) ARPGX2
Influenza A by PCR: POSITIVE — AB
Influenza B by PCR: NEGATIVE
SARS Coronavirus 2 by RT PCR: NEGATIVE

## 2022-05-14 MED ORDER — OSELTAMIVIR PHOSPHATE 75 MG PO CAPS: 75.0000 mg | ORAL_CAPSULE | Freq: Two times a day (BID) | ORAL | 0 refills | Status: DC

## 2022-05-14 NOTE — ED Provider Notes (Signed)
[facility] EMERGENCY DEPARTMENT Provider Note   CSN: [id number] Arrival date & time: 05/14/22  1320     History Chief Complaint  Patient presents with   Influenza    Tracey Turner is a 66 y.o. female.   Influenza Presenting symptoms: cough, fever, myalgias and vomiting   Presenting symptoms: no diarrhea, no nausea and no shortness of breath   Associated symptoms: chills   Tracey Turner presents with 4 days of cough, congestion, fever, and headache. History of hypertension and type 2 diabetes. She states that brother tested positive for influenza on 05/11/2022 and was hospitalized due to severity of symptoms. She was exposed to him while he was symptomatic and prior to his hospitalization. States she has had episodes of vomiting due to severe coughing, but does not feel nauseous. UTD on influenza immunization. Denies chest pain, shortness of breath, chest tightness, diarrhea, abdominal pain, or changes in urinary frequency or urgency.     Home Medications Prior to Admission medications   Medication Sig Start Date End Date Taking? Authorizing Provider  oseltamivir (TAMIFLU) 75 MG capsule Take 1 capsule (75 mg total) by mouth every 12 (twelve) hours. 05/14/22  Yes Smitty Knudsen, PA-C  acetaminophen (TYLENOL) 650 MG CR tablet Take 650 mg by mouth every 8 (eight) hours as needed. arthritis    [provider]  Calcium Carbonate-Vitamin D (CALCIUM + D PO) Take by mouth daily.     [provider]  cetirizine (ZYRTEC) 10 MG tablet Take 10 mg by mouth daily.    [provider]  Continuous Blood Gluc Receiver (FREESTYLE LIBRE 2 READER) DEVI Use as directed to check glucose. 03/06/22   Tommie Sams, DO  Continuous Blood Gluc Sensor (FREESTYLE LIBRE 2 SENSOR) MISC Apply every 14 days as directed. 03/06/22   Tommie Sams, DO  cyclobenzaprine (FLEXERIL) 10 MG tablet Take 1 tablet (10 mg total) by mouth 3 (three) times daily as needed for muscle spasms. Muscle spasms  03/03/22   Tommie Sams, DO  Dulaglutide (TRULICITY) 0.75 MG/0.5ML SOPN INJECT 0.75 MG  SUBCUTANEOUSLY ONCE A WEEK 03/02/22   Everlene Other G, DO  ferrous sulfate dried (SLOW FE) 160 (50 FE) MG TBCR Take 160 mg by mouth 3 (three) times daily with meals.    [provider]  fluticasone (FLONASE) 50 MCG/ACT nasal spray Place 2 sprays into both nostrils daily. 06/15/17   Babs Sciara, MD  furosemide (LASIX) 20 MG tablet TAKE 1 TABLET BY MOUTH ONCE DAILY AS NEEDED FOR EDEMA 04/06/22   Tommie Sams, DO  gabapentin (NEURONTIN) 100 MG capsule Take 1 capsule by mouth twice daily 04/03/22   Cook, Jayce G, DO  glipiZIDE (GLUCOTROL) 10 MG tablet Take 1 tablet (10 mg total) by mouth 2 (two) times daily before a meal. 03/06/22   Cook, Verdis Frederickson, DO  latanoprost (XALATAN) 0.005 % ophthalmic solution INSTILL 1 DROP IN [facility]  AT BEDTIME 09/28/17   Campbell Riches, NP  lidocaine (LIDODERM) 5 % Remove & Discard patch within 12 hours or as directed by MD 03/02/22   Tommie Sams, DO  metFORMIN (GLUCOPHAGE XR) 750 MG 24 hr tablet Take one tablet po daily with food 07/15/21   Everlene Other G, DO  metoprolol tartrate (LOPRESSOR) 50 MG tablet TAKE ONE TABLET BY MOUTH TWICE DAILY 04/25/22   Tommie Sams, DO  Multiple Vitamin (MULTIVITAMIN WITH MINERALS) TABS Take 1 tablet by mouth daily.    [provider]  traMADol (ULTRAM) 50 MG tablet Take 1 tablet (50 mg total) by mouth every 12 (twelve) hours as needed for moderate pain or severe pain. 03/03/22   Coral Spikes, DO  triamterene-hydrochlorothiazide (DYAZIDE) 37.5-25 MG capsule Take 1 capsule by mouth once daily 01/02/22   Coral Spikes, DO      Allergies    Ace inhibitors, Protonix [pantoprazole], Norvasc [amlodipine], Amoxicillin, Nexium [esomeprazole magnesium], Nsaids, Prevacid [lansoprazole], Prilosec [omeprazole], and Victoza [liraglutide]    Review of Systems   Review of Systems  Constitutional:  Positive for chills and fever.  Respiratory:   Positive for cough. Negative for shortness of breath and wheezing.   Cardiovascular:  Negative for chest pain.  Gastrointestinal:  Positive for vomiting. Negative for diarrhea and nausea.  Musculoskeletal:  Positive for arthralgias and myalgias.    Physical Exam Updated Vital Signs BP (!) 173/72   Pulse 83   Temp 99.8 F (37.7 C) (Oral)   Resp 20   Ht 5\' 6"  (1.676 m)   Wt (!) 140.2 kg   SpO2 93%   BMI 49.87 kg/m  Physical Exam Vitals and nursing note reviewed.  Constitutional:      General: She is not in acute distress.    Appearance: She is obese. She is ill-appearing.     Comments: Patient appears ill. Cooperative and attempting to be pleasant, but obviously feeling ill.  HENT:     Head: Normocephalic and atraumatic.     Nose: Congestion present.  Eyes:     General: No scleral icterus.       Right eye: No discharge.        Left eye: No discharge.     Conjunctiva/sclera: Conjunctivae normal.     Pupils: Pupils are equal, round, and reactive to light.  Cardiovascular:     Rate and Rhythm: Normal rate and regular rhythm.     Pulses: Normal pulses.     Heart sounds: Normal heart sounds.  Pulmonary:     Effort: Pulmonary effort is normal. No respiratory distress.     Breath sounds: Normal breath sounds. No wheezing.  Musculoskeletal:        General: No swelling.  Skin:    Findings: No rash.  Neurological:     Mental Status: She is alert.     ED Results / Procedures / Treatments   Labs (all labs ordered are listed, but only abnormal results are displayed) Labs Reviewed  RESP PANEL BY RT-PCR (FLU A&B, COVID) ARPGX2 - Abnormal; Notable for the following components:      Result Value   Influenza A by PCR POSITIVE (*)    All other components within normal limits    EKG None  Radiology No results found.  Procedures Procedures   Medications Ordered in ED Medications - No data to display  ED Course/ Medical Decision Making/ A&P                            Medical Decision Making  This patient presents to the ED for concern of cough and fever. Differential diagnosis including but not limited to influenza, COVID-19, viral URI, bronchitis, and pneumonia    Lab Tests:  I Ordered, and personally interpreted labs.  The pertinent results include:  Positive for Influenza A.    Medicines ordered and prescription drug management:  I ordered medication including Tamiflu for influenza A.  I have reviewed the patients home medicines and have made adjustments as  needed   Problem List / ED Course:  Patient presented with concerns that she contracted influenza from her brother after having symptoms start on 05/11/2022. She is concerned since her brother was hospitalized due to how sick he became. She states that she has experienced cough, fever, headache, generalized body aches, and vomiting due to coughing. Based on presentation, viral swab was taken. Influenza A was detected and prescription for Tamiflu was sent due to increased of complications including age, hypertension, and type 2 diabetes. Patient verbalized understanding results and benefits of treatment at this time and was agreeable to treatment plan. Discussed return precautions if condition worsens.   Final Clinical Impression(s) / ED Diagnoses Final diagnoses:  Influenza    Rx / DC Orders ED Discharge Orders          Ordered    oseltamivir (TAMIFLU) 75 MG capsule  Every 12 hours        05/14/22 1635              Luvenia Heller, PA-C 05/14/22 1643    Noemi Chapel, MD 05/14/22 2047

## 2022-05-14 NOTE — Discharge Instructions (Addendum)
You were seen in the ER today and diagnosed with Influenza A. A 5 day course of Tamiflu has been sent to your pharmacy for treat infection. Continue to take Tylenol or Ibuprofen as needed for fever and muscle aches as needed. Attempt to limit exposure to other individuals in the home or work as possible to reduce the spread of influenza to others.

## 2022-05-14 NOTE — ED Triage Notes (Signed)
Pt believes she got the flu from her brother.  Cough and congestion since Thursday. Emesis yesterday.

## 2022-05-17 ENCOUNTER — Encounter: Payer: Medicare Other | Admitting: Nurse Practitioner

## 2022-05-17 ENCOUNTER — Telehealth: Payer: Self-pay | Admitting: *Deleted

## 2022-05-18 ENCOUNTER — Encounter: Payer: Medicare Other | Admitting: Nurse Practitioner

## 2022-05-19 ENCOUNTER — Ambulatory Visit: Payer: Medicare Other | Admitting: Gastroenterology

## 2022-06-01 ENCOUNTER — Ambulatory Visit: Payer: Medicare Other | Admitting: Family Medicine

## 2022-06-01 DIAGNOSIS — H40053 Ocular hypertension, bilateral: Secondary | ICD-10-CM | POA: Diagnosis not present

## 2022-06-16 ENCOUNTER — Ambulatory Visit (INDEPENDENT_AMBULATORY_CARE_PROVIDER_SITE_OTHER): Payer: Medicare Other | Admitting: Family Medicine

## 2022-06-16 VITALS — BP 138/74 | Ht 66.0 in | Wt 303.6 lb

## 2022-06-16 DIAGNOSIS — I10 Essential (primary) hypertension: Secondary | ICD-10-CM

## 2022-06-16 DIAGNOSIS — E1165 Type 2 diabetes mellitus with hyperglycemia: Secondary | ICD-10-CM

## 2022-06-16 MED ORDER — CYCLOBENZAPRINE HCL 10 MG PO TABS: 10.0000 mg | ORAL_TABLET | Freq: Three times a day (TID) | ORAL | 5 refills | Status: DC | PRN

## 2022-06-16 MED ORDER — GABAPENTIN 100 MG PO CAPS: 100.0000 mg | ORAL_CAPSULE | Freq: Two times a day (BID) | ORAL | 3 refills | Status: DC

## 2022-06-16 MED ORDER — GLIPIZIDE 10 MG PO TABS: 10.0000 mg | ORAL_TABLET | Freq: Two times a day (BID) | ORAL | 1 refills | Status: DC

## 2022-06-16 MED ORDER — TRAMADOL HCL 50 MG PO TABS: 50.0000 mg | ORAL_TABLET | Freq: Two times a day (BID) | ORAL | 5 refills | Status: DC | PRN

## 2022-06-16 MED ORDER — FUROSEMIDE 20 MG PO TABS: 20.0000 mg | ORAL_TABLET | Freq: Every day | ORAL | 3 refills | Status: DC | PRN

## 2022-06-16 MED ORDER — TRIAMTERENE-HCTZ 37.5-25 MG PO CAPS: 1.0000 | ORAL_CAPSULE | Freq: Every day | ORAL | 3 refills | Status: DC

## 2022-06-16 MED ORDER — METFORMIN HCL ER 750 MG PO TB24: ORAL_TABLET | ORAL | 3 refills | Status: DC

## 2022-06-16 MED ORDER — SEMAGLUTIDE(0.25 OR 0.5MG/DOS) 2 MG/3ML ~~LOC~~ SOPN: PEN_INJECTOR | SUBCUTANEOUS | 0 refills | Status: DC

## 2022-06-16 MED ORDER — METOPROLOL TARTRATE 50 MG PO TABS: 50.0000 mg | ORAL_TABLET | Freq: Two times a day (BID) | ORAL | 3 refills | Status: DC

## 2022-06-16 NOTE — Patient Instructions (Signed)
A1C ordered.  I started Ozempic and refilled your medications.  Follow up in 3 months.  Take care  Dr. Adriana Simas

## 2022-06-19 NOTE — Progress Notes (Signed)
Subjective:  Patient ID: Tracey Turner, female    DOB: Apr 19, 1956  Age: 67 y.o. MRN: [id number]  CC: Chief Complaint  Patient presents with   Diabetes    Follow up Problems with arthritis pain in ankles   HPI:  67 year old female with HTN, GERD, DM-2, HLD, Morbid obesity presents for follow up.  Diabetes uncontrolled. Last A1C 7.7. She is currently on Metformin and Glipizide. No hypoglycemia. She would like to discuss GLP-1 medication (Ozempic).   Needs refill on Tramadol (uses for arthritic pain).  BP well controlled on Lasix, Dyazide, and Metoprolol.   Patient Active Problem List   Diagnosis Date Noted   Low back pain 11/25/2021   Hyperpigmentation 11/25/2021   Hyperlipidemia 04/15/2021   Varicose veins of both legs with edema 02/02/2020   Ocular hypertension 12/05/2019   GERD (gastroesophageal reflux disease) 06/15/2014   Morbid obesity (HCC) 12/18/2013   Uncontrolled type 2 diabetes mellitus with hyperglycemia (HCC) 09/12/2012   Essential hypertension 09/12/2012   Arthritis of knee 02/23/2011    Social Hx   Social History   Socioeconomic History   Marital status: Single    Spouse name: Not on file   Number of children: Not on file   Years of education: some coll.   Highest education level: Not on file  Occupational History   Occupation: [employer]    Employer: [employer]  Tobacco Use   Smoking status: Former    Packs/day: 1.00    Years: 30.00    Total pack years: 30.00    Types: Cigarettes    Start date: 07/22/1970    Quit date: 06/21/2007    Years since quitting: 15.0   Smokeless tobacco: Never  Vaping Use   Vaping Use: Never used  Substance and Sexual Activity   Alcohol use: No    Alcohol/week: 0.0 standard drinks of alcohol   Drug use: No   Sexual activity: Not Currently    Birth control/protection: Post-menopausal  Other Topics Concern   Not on file  Social History Narrative   Not on file   Social Determinants of Health   Financial  Resource Strain: Low Risk  (05/17/2022)   Overall Financial Resource Strain (CARDIA)    Difficulty of Paying Living Expenses: Not hard at all  Food Insecurity: Not on file  Transportation Needs: No Transportation Needs (05/17/2022)   PRAPARE - Administrator, Civil Service (Medical): No    Lack of Transportation (Non-Medical): No  Physical Activity: Not on file  Stress: Not on file  Social Connections: Not on file    Review of Systems Per HPI  Objective:  BP 138/74   Ht 5\' 6"  (1.676 m)   Wt (!) 303 lb 9.6 oz (137.7 kg)   BMI 49.00 kg/m      06/16/2022    1:16 PM 05/14/2022    3:10 PM 05/14/2022    1:56 PM  BP/Weight  Systolic BP 138 173 154  Diastolic BP 74 72 64  Wt. (Lbs) 303.6  309  BMI 49 kg/m2  49.87 kg/m2    Physical Exam Vitals and nursing note reviewed.  Constitutional:      Appearance: Normal appearance. She is obese.  HENT:     Head: Normocephalic and atraumatic.  Eyes:     General:        Right eye: No discharge.        Left eye: No discharge.     Conjunctiva/sclera: Conjunctivae normal.  Cardiovascular:  Rate and Rhythm: Normal rate and regular rhythm.  Pulmonary:     Effort: Pulmonary effort is normal.     Breath sounds: Normal breath sounds. No wheezing, rhonchi or rales.  Neurological:     Mental Status: She is alert.  Psychiatric:        Mood and Affect: Mood normal.        Behavior: Behavior normal.    Lab Results  Component Value Date   WBC 7.6 11/25/2021   HGB 12.5 11/25/2021   HCT 39.3 11/25/2021   PLT 285 11/25/2021   GLUCOSE 138 (H) 11/25/2021   CHOL 147 11/25/2021   TRIG 130 11/25/2021   HDL 58 11/25/2021   LDLCALC 66 11/25/2021   ALT 18 11/25/2021   AST 21 11/25/2021   NA 144 11/25/2021   K 4.5 11/25/2021   CL 104 11/25/2021   CREATININE 0.66 11/25/2021   BUN 10 11/25/2021   CO2 25 11/25/2021   TSH 0.725 06/03/2021   HGBA1C 7.7 (H) 03/02/2022   MICROALBUR 2.53 (H) 09/22/2013     Assessment & Plan:    Problem List Items Addressed This Visit       Cardiovascular and Mediastinum   Essential hypertension    Stable. Continue current meds. Refilled today.      Relevant Medications   furosemide (LASIX) 20 MG tablet   triamterene-hydrochlorothiazide (DYAZIDE) 37.5-25 MG capsule   metoprolol tartrate (LOPRESSOR) 50 MG tablet     Endocrine   Uncontrolled type 2 diabetes mellitus with hyperglycemia (HCC) - Primary    Uncontrolled. Adding Ozempic.       Relevant Medications   glipiZIDE (GLUCOTROL) 10 MG tablet   Semaglutide,0.25 or 0.5MG /DOS, 2 MG/3ML SOPN   metFORMIN (GLUCOPHAGE XR) 750 MG 24 hr tablet   Other Relevant Orders   Hemoglobin A1c    Meds ordered this encounter  Medications   cyclobenzaprine (FLEXERIL) 10 MG tablet    Sig: Take 1 tablet (10 mg total) by mouth 3 (three) times daily as needed for muscle spasms. Muscle spasms    Dispense:  90 tablet    Refill:  5   furosemide (LASIX) 20 MG tablet    Sig: Take 1 tablet (20 mg total) by mouth daily as needed for edema.    Dispense:  30 tablet    Refill:  3   gabapentin (NEURONTIN) 100 MG capsule    Sig: Take 1 capsule (100 mg total) by mouth 2 (two) times daily.    Dispense:  180 capsule    Refill:  3   glipiZIDE (GLUCOTROL) 10 MG tablet    Sig: Take 1 tablet (10 mg total) by mouth 2 (two) times daily before a meal.    Dispense:  180 tablet    Refill:  1   DISCONTD: metFORMIN (GLUCOPHAGE XR) 750 MG 24 hr tablet    Sig: Take one tablet po daily with food    Dispense:  90 tablet    Refill:  3   DISCONTD: metoprolol tartrate (LOPRESSOR) 50 MG tablet    Sig: Take 1 tablet (50 mg total) by mouth 2 (two) times daily.    Dispense:  180 tablet    Refill:  3   traMADol (ULTRAM) 50 MG tablet    Sig: Take 1 tablet (50 mg total) by mouth every 12 (twelve) hours as needed for moderate pain or severe pain.    Dispense:  60 tablet    Refill:  5    [facility] [place]  triamterene-hydrochlorothiazide (DYAZIDE) 37.5-25 MG  capsule    Sig: Take 1 each (1 capsule total) by mouth daily.    Dispense:  90 capsule    Refill:  3   Semaglutide,0.25 or 0.5MG /DOS, 2 MG/3ML SOPN    Sig: 0.25 mg once weekly for 4 weeks, then increase to 0.5 mg once weekly    Dispense:  3 mL    Refill:  0   metFORMIN (GLUCOPHAGE XR) 750 MG 24 hr tablet    Sig: Take one tablet po daily with food    Dispense:  90 tablet    Refill:  3   metoprolol tartrate (LOPRESSOR) 50 MG tablet    Sig: Take 1 tablet (50 mg total) by mouth 2 (two) times daily.    Dispense:  180 tablet    Refill:  3    Follow-up:  3 months  Jourdan Durbin Adriana Simas DO [facility]

## 2022-06-19 NOTE — Assessment & Plan Note (Signed)
Uncontrolled. Adding Ozempic.

## 2022-06-19 NOTE — Assessment & Plan Note (Signed)
Stable. Continue current meds. Refilled today.

## 2022-06-30 ENCOUNTER — Ambulatory Visit: Payer: Medicare Other | Admitting: Gastroenterology

## 2022-07-06 DIAGNOSIS — I872 Venous insufficiency (chronic) (peripheral): Secondary | ICD-10-CM | POA: Diagnosis not present

## 2022-07-06 DIAGNOSIS — E119 Type 2 diabetes mellitus without complications: Secondary | ICD-10-CM | POA: Diagnosis not present

## 2022-07-06 DIAGNOSIS — R600 Localized edema: Secondary | ICD-10-CM | POA: Diagnosis not present

## 2022-07-06 DIAGNOSIS — I83893 Varicose veins of bilateral lower extremities with other complications: Secondary | ICD-10-CM | POA: Diagnosis not present

## 2022-07-06 DIAGNOSIS — I10 Essential (primary) hypertension: Secondary | ICD-10-CM | POA: Diagnosis not present

## 2022-07-25 NOTE — Patient Outreach (Signed)
  Care Coordination TOC Note Transition Care Management Follow-up Telephone Call Date of discharge and from where: [facility] ED on 11/26 How have you been since you were released from the hospital? Better. Eating and drinking well Any questions or concerns? No  Items Reviewed: Did the pt receive and understand the discharge instructions provided? Yes  Medications obtained and verified? Yes  Other? No  Any new allergies since your discharge? No  Dietary orders reviewed? No Do you have support at home? Yes   Home Care and Equipment/Supplies: Were home health services ordered? no If so, what is the name of the agency?   Has the agency set up a time to come to the patient's home? not applicable Were any new equipment or medical supplies ordered?  No What is the name of the medical supply agency?  Were you able to get the supplies/equipment? not applicable Do you have any questions related to the use of the equipment or supplies? No  Functional Questionnaire: (I = Independent and D = Dependent) ADLs: I  Bathing/Dressing- I  Meal Prep- I  Eating- I  Maintaining continence- I  Transferring/Ambulation- I  Managing Meds- I  Follow up appointments reviewed:  PCP Hospital f/u appt confirmed?  [facility] f/u appt confirmed?  N/A   Are transportation arrangements needed? No  If their condition worsens, is the pt aware to call PCP or go to the Emergency Dept.? Yes Was the patient provided with contact information for the PCP's office or ED? Yes Was to pt encouraged to call back with questions or concerns? Yes  SDOH assessments and interventions completed:   Yes SDOH Interventions Today    Flowsheet Row Most Recent Value  SDOH Interventions   Transportation Interventions Intervention Not Indicated  Financial Strain Interventions Intervention Not Indicated       Care Coordination Interventions:  No Care Coordination interventions needed at this time.    Encounter Outcome:  Pt. Visit Completed    Chong Sicilian, BSN, RN-BC [place]: [phone] Main #: [phone]  (*Late entry for telephone follow-up on 05/17/22 noticed during chart review)

## 2022-07-27 DIAGNOSIS — L818 Other specified disorders of pigmentation: Secondary | ICD-10-CM | POA: Diagnosis not present

## 2022-07-31 ENCOUNTER — Other Ambulatory Visit: Payer: Self-pay | Admitting: *Deleted

## 2022-07-31 MED ORDER — GABAPENTIN 100 MG PO CAPS: 100.0000 mg | ORAL_CAPSULE | Freq: Two times a day (BID) | ORAL | 0 refills | Status: DC

## 2022-08-04 ENCOUNTER — Other Ambulatory Visit: Payer: Self-pay

## 2022-08-04 ENCOUNTER — Encounter: Payer: Self-pay | Admitting: Gastroenterology

## 2022-08-04 ENCOUNTER — Ambulatory Visit (INDEPENDENT_AMBULATORY_CARE_PROVIDER_SITE_OTHER): Payer: Medicare Other | Admitting: Gastroenterology

## 2022-08-04 VITALS — BP 175/83 | HR 72 | Temp 98.1°F | Ht 66.0 in | Wt 304.0 lb

## 2022-08-04 DIAGNOSIS — Z01818 Encounter for other preprocedural examination: Secondary | ICD-10-CM

## 2022-08-04 DIAGNOSIS — Z1211 Encounter for screening for malignant neoplasm of colon: Secondary | ICD-10-CM

## 2022-08-04 MED ORDER — CYCLOBENZAPRINE HCL 10 MG PO TABS: 10.0000 mg | ORAL_TABLET | Freq: Three times a day (TID) | ORAL | 1 refills | Status: DC | PRN

## 2022-08-04 MED ORDER — SEMAGLUTIDE(0.25 OR 0.5MG/DOS) 2 MG/3ML ~~LOC~~ SOPN: PEN_INJECTOR | SUBCUTANEOUS | 0 refills | Status: DC

## 2022-08-04 NOTE — Patient Instructions (Addendum)
I have submitted order for Cologuard testing. If you have not received a call or kit in the mail from [employer] in the next two weeks please contact my [place] at [phone].  Please follow up with Dr. Lacinda Axon for persistently elevated blood pressure above 130/90.    It was a pleasure to see you today. I want to create trusting relationships with patients and provide genuine, compassionate, and quality care. I truly value your feedback, so please be on the lookout for a survey regarding your visit with me today. I appreciate your time in completing this!

## 2022-08-04 NOTE — Progress Notes (Signed)
GI Office Note    Referring Provider: Coral Spikes, DO Primary Care Physician:  Coral Spikes, DO  Primary Gastroenterologist: Elon Alas. Abbey Chatters, DO   Chief Complaint   Chief Complaint  Patient presents with   Colonoscopy     History of Present Illness   Tracey Turner is a 67 y.o. female presenting today at the request of Dr. Lacinda Axon for colon cancer screening.   Patient's last colonoscopy was in 2012. No personal history of colon polyps or FH of colon cancer. She is concerned about bowel preparation and completing the colonoscopy given her mobility limitations. She saw advertisements regarding Cologuard and she wants to know if she can pursue it instead. She is willing to have colonoscopy if the Cologuard is positive.   From a GI standpoint she feels well. Takes famotidine as needed for reflux symptoms. Denies bowel concerns, melena, brbpr, abdominal pain, dysphagia.    EGD/colonoscopy August 2012: Mild gastritis and duodenitis, no H. pylori, internal hemorrhoids.  Small bowel capsule study 03/2012: -Pointed views of gastric mucosa due to retained contents -Prior small ulcer, antral gastritis, no blood in the stomach -Normal small bowel, no masses, ulcers, or AVMs   Medications   Current Outpatient Medications  Medication Sig Dispense Refill   acetaminophen (TYLENOL) 650 MG CR tablet Take 650 mg by mouth every 8 (eight) hours as needed. arthritis     Calcium Carbonate-Vitamin D (CALCIUM + D PO) Take by mouth daily.      cetirizine (ZYRTEC) 10 MG tablet Take 10 mg by mouth daily.     cyclobenzaprine (FLEXERIL) 10 MG tablet Take 1 tablet (10 mg total) by mouth 3 (three) times daily as needed for muscle spasms. Muscle spasms 90 tablet 5   ferrous sulfate dried (SLOW FE) 160 (50 FE) MG TBCR Take 160 mg by mouth 3 (three) times daily with meals.     fluticasone (FLONASE) 50 MCG/ACT nasal spray Place 2 sprays into both nostrils daily. 48 g 5   furosemide (LASIX) 20 MG  tablet Take 1 tablet (20 mg total) by mouth daily as needed for edema. 30 tablet 3   gabapentin (NEURONTIN) 100 MG capsule Take 1 capsule (100 mg total) by mouth 2 (two) times daily. 180 capsule 0   glipiZIDE (GLUCOTROL) 10 MG tablet Take 1 tablet (10 mg total) by mouth 2 (two) times daily before a meal. 180 tablet 1   latanoprost (XALATAN) 0.005 % ophthalmic solution INSTILL 1 DROP IN EACH EYE  AT BEDTIME 7.5 mL 5   lidocaine (LIDODERM) 5 % Remove & Discard patch within 12 hours or as directed by MD 30 patch 4   metFORMIN (GLUCOPHAGE XR) 750 MG 24 hr tablet Take one tablet po daily with food 90 tablet 3   metoprolol tartrate (LOPRESSOR) 50 MG tablet Take 1 tablet (50 mg total) by mouth 2 (two) times daily. 180 tablet 3   Multiple Vitamin (MULTIVITAMIN WITH MINERALS) TABS Take 1 tablet by mouth daily.     traMADol (ULTRAM) 50 MG tablet Take 1 tablet (50 mg total) by mouth every 12 (twelve) hours as needed for moderate pain or severe pain. 60 tablet 5   triamterene-hydrochlorothiazide (DYAZIDE) 37.5-25 MG capsule Take 1 each (1 capsule total) by mouth daily. 90 capsule 3   Semaglutide,0.25 or 0.5MG/DOS, 2 MG/3ML SOPN 0.25 mg once weekly for 4 weeks, then increase to 0.5 mg once weekly (Patient not taking: Reported on 08/04/2022) 3 mL 0   No current  facility-administered medications for this visit.    Allergies   Allergies as of 08/04/2022 - Review Complete 06/16/2022  Allergen Reaction Noted   Ace inhibitors Shortness Of Breath and Swelling 01/19/2011   Protonix [pantoprazole] Diarrhea and Itching 02/12/2017   Norvasc [amlodipine] Other (See Comments) 06/08/2020   Amoxicillin  03/08/2012   Cholecalciferol  12/23/2021   Nexium [esomeprazole magnesium] Diarrhea 03/14/2017   Nsaids Other (See Comments) 02/23/2011   Prevacid [lansoprazole]  04/30/2017   Prilosec [omeprazole]  09/12/2012   Victoza [liraglutide] Other (See Comments) 11/15/2013    Past Medical History   Past Medical History:   Diagnosis Date   Anemia    Arthritis    Back pain    Gastritis    Glaucoma    Hypertension    Knee pain    Type 2 diabetes mellitus ([place])     Past Surgical History   Past Surgical History:  Procedure Laterality Date   COLONOSCOPY  02/13/2011   internal hemorrhoids   ESOPHAGOGASTRODUODENOSCOPY  02/13/2011   mild gatritis   GIVENS CAPSULE STUDY  03/28/2012       GIVENS CAPSULE STUDY  03/26/2012   Procedure: GIVENS CAPSULE STUDY;  Surgeon: Danie Binder, MD;  Location: [facility];  Service: Endoscopy;  Laterality: N/A;  7:30   None      Past Family History   Family History  Problem Relation Age of Onset   Prostate cancer Father    Stroke Father    Hypertension Father    Diabetes Mother    Heart disease Mother    Hypertension Mother    Heart disease Sister    Asthma Sister    Kidney disease Brother    Diabetes Brother    Hypertension Brother    Heart disease Brother    Diabetes Brother    Heart disease Brother    Diabetes Brother    Heart disease Brother    Heart disease Unknown    Arthritis Unknown    Cancer Unknown    Diabetes Unknown    Kidney disease Unknown    Colon cancer Neg Hx    Colon polyps Neg Hx     Past Social History   Social History   Socioeconomic History   Marital status: Single    Spouse name: Not on file   Number of children: Not on file   Years of education: some coll.   Highest education level: Not on file  Occupational History   Occupation: [employer]    Employer: [employer]  Tobacco Use   Smoking status: Former    Packs/day: 1.00    Years: 30.00    Total pack years: 30.00    Types: Cigarettes    Start date: 07/22/1970    Quit date: 06/21/2007    Years since quitting: 15.1   Smokeless tobacco: Never  Vaping Use   Vaping Use: Never used  Substance and Sexual Activity   Alcohol use: No    Alcohol/week: 0.0 standard drinks of alcohol   Drug use: No   Sexual activity: Not Currently    Birth control/protection:  Post-menopausal  Other Topics Concern   Not on file  Social History Narrative   Not on file   Social Determinants of Health   Financial Resource Strain: Low Risk  (05/17/2022)   Overall Financial Resource Strain (CARDIA)    Difficulty of Paying Living Expenses: Not hard at all  Food Insecurity: Not on file  Transportation Needs: No Transportation Needs (05/17/2022)  PRAPARE - Hydrologist (Medical): No    Lack of Transportation (Non-Medical): No  Physical Activity: Not on file  Stress: Not on file  Social Connections: Not on file  Intimate Partner Violence: Not on file    Review of Systems   General: Negative for anorexia, weight loss, fever, chills, fatigue, weakness. Eyes: Negative for vision changes.  ENT: Negative for hoarseness, difficulty swallowing , nasal congestion. CV: Negative for chest pain, angina, palpitations, dyspnea on exertion, peripheral edema.  Respiratory: Negative for dyspnea at rest, dyspnea on exertion, cough, sputum, wheezing.  GI: See history of present illness. GU:  Negative for dysuria, hematuria, urinary incontinence, urinary frequency, nocturnal urination.  MS: Negative for joint pain, low back pain.  Derm: Negative for rash or itching.  Neuro: Negative for weakness, abnormal sensation, seizure, frequent headaches, memory loss,  confusion.  Psych: Negative for anxiety, depression, suicidal ideation, hallucinations.  Endo: Negative for unusual weight change.  Heme: Negative for bruising or bleeding. Allergy: Negative for rash or hives.  Physical Exam   BP (!) 175/83 (BP Location: Right Arm, Patient Position: Sitting, Cuff Size: Large)   Pulse 72   Temp 98.1 F (36.7 C) (Oral)   Ht 5' 6"$  (1.676 m)   Wt (!) 304 lb (137.9 kg)   SpO2 96%   BMI 49.07 kg/m    General: Well-nourished, well-developed in no acute distress. Some difficulty getting onto exam table.  Head: Normocephalic, atraumatic.   Eyes:  Conjunctiva pink, no icterus. Mouth: Oropharyngeal mucosa moist and pink  Neck: Supple without thyromegaly, masses, or lymphadenopathy.  Lungs: Clear to auscultation bilaterally.  Heart: Regular rate and rhythm, no murmurs rubs or gallops.  Abdomen: Bowel sounds are normal, nontender, nondistended, no hepatosplenomegaly or masses, no abdominal bruits or hernia, no rebound or guarding.  Exam limited due to body habitus. Rectal: not performed Extremities: 1+ lower extremity edema. No clubbing or deformities.  Neuro: Alert and oriented x 4 , grossly normal neurologically.  Skin: Warm and dry, no rash or jaundice.   Psych: Alert and cooperative, normal mood and affect.  Labs   Lab Results  Component Value Date   CREATININE 0.66 11/25/2021   BUN 10 11/25/2021   NA 144 11/25/2021   K 4.5 11/25/2021   CL 104 11/25/2021   CO2 25 11/25/2021   Lab Results  Component Value Date   WBC 7.6 11/25/2021   HGB 12.5 11/25/2021   HCT 39.3 11/25/2021   MCV 86 11/25/2021   PLT 285 11/25/2021   Lab Results  Component Value Date   ALT 18 11/25/2021   AST 21 11/25/2021   ALKPHOS 81 11/25/2021   BILITOT 0.2 11/25/2021    Imaging Studies   No results found.  Assessment   Encounter for colon cancer screening: patient is interested in pursuing Cologuard initially. She is a good candidate given no bowel concerns, personal history of polyps, FH of colon cancer.   The patient was found to have elevated blood pressure when vital signs were checked in the office. The blood pressure was rechecked by the nursing staff and it was found be persistently elevated >140/90 mmHg. I personally advised to the patient to follow up closely with his PCP for hypertension control.  PLAN   Cologuard   Laureen Ochs. Bobby Rumpf, [place], [facility]

## 2022-08-08 ENCOUNTER — Encounter: Payer: Self-pay | Admitting: Gastroenterology

## 2022-08-09 ENCOUNTER — Telehealth: Payer: Self-pay

## 2022-08-09 NOTE — Telephone Encounter (Signed)
Pt talked to [employer] and wants Dr Lacinda Axon to call and give pro authorization for the Ozempic they can do three months for 135.00 [place] was 200.00 plus for one month so this is why she is moving medication and wants [facility] they could get cheaper just need pro authorization.   Ayisha call back [phone] and said you may have to leave a message when she see's it she will call back because she is at work

## 2022-08-09 NOTE — Telephone Encounter (Signed)
PA was attempted but insurance has denied it. Contacted pt to let her know. Pt verbalized understanding.

## 2022-08-15 ENCOUNTER — Telehealth: Payer: Self-pay | Admitting: *Deleted

## 2022-08-15 NOTE — Telephone Encounter (Signed)
Patient called and stated she spoke with her insurance and they have agreed to pay for the [place] if sent to [facility]- Patient would like new starting dose sent to [facility] for a 90 day supply  Patient aware of taper for med but wants to do the doses as 90 day supply( slow taper ) so it is covered by her insurance.

## 2022-08-16 ENCOUNTER — Other Ambulatory Visit: Payer: Self-pay | Admitting: Family Medicine

## 2022-08-17 MED ORDER — SEMAGLUTIDE(0.25 OR 0.5MG/DOS) 2 MG/3ML ~~LOC~~ SOPN: PEN_INJECTOR | SUBCUTANEOUS | 0 refills | Status: DC

## 2022-08-17 NOTE — Telephone Encounter (Signed)
Prescription was sent to [facility] and confirmed 08/04/2022   Left message to return call to notify patient

## 2022-08-17 NOTE — Telephone Encounter (Signed)
Patient notified and requested it be resent since she has not received it and they say they don't have it. Prescription sent electronically to the pharmacy.

## 2022-08-18 DIAGNOSIS — Z1211 Encounter for screening for malignant neoplasm of colon: Secondary | ICD-10-CM | POA: Diagnosis not present

## 2022-08-26 LAB — COLOGUARD: COLOGUARD: NEGATIVE

## 2022-08-31 DIAGNOSIS — E1165 Type 2 diabetes mellitus with hyperglycemia: Secondary | ICD-10-CM | POA: Diagnosis not present

## 2022-09-01 LAB — HEMOGLOBIN A1C
Est. average glucose Bld gHb Est-mCnc: 189 mg/dL
Hgb A1c MFr Bld: 8.2 % — ABNORMAL HIGH (ref 4.8–5.6)

## 2022-09-04 ENCOUNTER — Telehealth: Payer: Self-pay

## 2022-09-04 DIAGNOSIS — E1165 Type 2 diabetes mellitus with hyperglycemia: Secondary | ICD-10-CM

## 2022-09-04 NOTE — Telephone Encounter (Signed)
Thersa Salt G, DO     She has an intolerance to victoza. I would like her to try Ozempic.

## 2022-09-04 NOTE — Telephone Encounter (Signed)
Patient states when she took the Victozia it paralyzed her where she could not get up or function- it occurred both times she tried to take it and the pharmacy will not let her have the [place] because of it   Please advise

## 2022-09-04 NOTE — Telephone Encounter (Signed)
Pt has a question about medication she was prescribed ozempic but th pharmacy said she can't take this because she is allergic to victozais there another medication that she can be put on ?   Stanton Kidney [phone]

## 2022-09-05 NOTE — Addendum Note (Signed)
Addended by: Orvan Seen on: 09/05/2022 04:04 PM   Modules accepted: Orders

## 2022-09-05 NOTE — Telephone Encounter (Signed)
Coral Spikes, DO     Recommend referral to Endo.

## 2022-09-15 ENCOUNTER — Ambulatory Visit (INDEPENDENT_AMBULATORY_CARE_PROVIDER_SITE_OTHER): Payer: Medicare Other | Admitting: Family Medicine

## 2022-09-15 VITALS — BP 134/71 | HR 58 | Ht 66.0 in | Wt 302.0 lb

## 2022-09-15 DIAGNOSIS — I10 Essential (primary) hypertension: Secondary | ICD-10-CM

## 2022-09-15 DIAGNOSIS — E1165 Type 2 diabetes mellitus with hyperglycemia: Secondary | ICD-10-CM

## 2022-09-15 NOTE — Patient Instructions (Signed)
Continue your current medications.  Referral placed.  Follow up in 3 months.

## 2022-09-18 NOTE — Progress Notes (Signed)
Subjective:  Patient ID: Tracey Turner, female    DOB: 07/15/1955  Age: 67 y.o. MRN: [id number]  CC: Chief Complaint  Patient presents with   Follow-up    Diabetic follow up    HPI:  67 year old female with the below mentioned medical problems presents for follow up.  Diabetes remains uncontrolled. Pharmacy would not fill Ozempic due to patient's prior side effects from Victoza. A1C 8.2. Remains on Metformin and Glipizide. Does not tolerate increase dose of Metformin.  HTN stable on Metoprolol, Dyazide, and Lasix.   Patient Active Problem List   Diagnosis Date Noted   Colon cancer screening 08/04/2022   Low back pain 11/25/2021   Hyperlipidemia 04/15/2021   Varicose veins of both legs with edema 02/02/2020   Ocular hypertension 12/05/2019   GERD (gastroesophageal reflux disease) 06/15/2014   Morbid obesity 12/18/2013   Uncontrolled type 2 diabetes mellitus with hyperglycemia 09/12/2012   Essential hypertension 09/12/2012   Arthritis of knee 02/23/2011    Social Hx   Social History   Socioeconomic History   Marital status: Single    Spouse name: Not on file   Number of children: Not on file   Years of education: some coll.   Highest education level: Not on file  Occupational History   Occupation: [employer]    Employer: [employer]  Tobacco Use   Smoking status: Former    Packs/day: 1.00    Years: 30.00    Additional pack years: 0.00    Total pack years: 30.00    Types: Cigarettes    Start date: 07/22/1970    Quit date: 06/21/2007    Years since quitting: 15.2   Smokeless tobacco: Never  Vaping Use   Vaping Use: Never used  Substance and Sexual Activity   Alcohol use: No    Alcohol/week: 0.0 standard drinks of alcohol   Drug use: No   Sexual activity: Not Currently    Birth control/protection: Post-menopausal  Other Topics Concern   Not on file  Social History Narrative   Not on file   Social Determinants of Health   Financial Resource Strain: Low  Risk  (05/17/2022)   Overall Financial Resource Strain (CARDIA)    Difficulty of Paying Living Expenses: Not hard at all  Food Insecurity: Not on file  Transportation Needs: No Transportation Needs (05/17/2022)   PRAPARE - Administrator, Civil Service (Medical): No    Lack of Transportation (Non-Medical): No  Physical Activity: Not on file  Stress: Not on file  Social Connections: Not on file    Review of Systems  Constitutional: Negative.    Objective:  BP 134/71   Pulse (!) 58   Ht 5\' 6"  (1.676 m)   Wt (!) 302 lb (137 kg)   SpO2 97%   BMI 48.74 kg/m      09/15/2022    1:44 PM 08/04/2022   11:10 AM 08/04/2022   10:36 AM  BP/Weight  Systolic BP 134 175 173  Diastolic BP 71 83 80  Wt. (Lbs) 302  304  BMI 48.74 kg/m2  49.07 kg/m2    Physical Exam Vitals and nursing note reviewed.  Constitutional:      General: She is not in acute distress.    Appearance: Normal appearance. She is obese.  HENT:     Head: Normocephalic and atraumatic.  Eyes:     General:        Right eye: No discharge.  Left eye: No discharge.     Conjunctiva/sclera: Conjunctivae normal.  Cardiovascular:     Rate and Rhythm: Normal rate and regular rhythm.  Pulmonary:     Effort: Pulmonary effort is normal.     Breath sounds: Normal breath sounds. No wheezing, rhonchi or rales.  Neurological:     Mental Status: She is alert.  Psychiatric:        Mood and Affect: Mood normal.        Behavior: Behavior normal.     Lab Results  Component Value Date   WBC 7.6 11/25/2021   HGB 12.5 11/25/2021   HCT 39.3 11/25/2021   PLT 285 11/25/2021   GLUCOSE 138 (H) 11/25/2021   CHOL 147 11/25/2021   TRIG 130 11/25/2021   HDL 58 11/25/2021   LDLCALC 66 11/25/2021   ALT 18 11/25/2021   AST 21 11/25/2021   NA 144 11/25/2021   K 4.5 11/25/2021   CL 104 11/25/2021   CREATININE 0.66 11/25/2021   BUN 10 11/25/2021   CO2 25 11/25/2021   TSH 0.725 06/03/2021   HGBA1C 8.2 (H)  08/31/2022   MICROALBUR 2.53 (H) 09/22/2013     Assessment & Plan:   Problem List Items Addressed This Visit       Cardiovascular and Mediastinum   Essential hypertension    Stable on current medications. Continue.         Endocrine   Uncontrolled type 2 diabetes mellitus with hyperglycemia - Primary    Uncontrolled. Continue Metformin and Glipizide.  Needs weight loss. Referring to Endo.       Relevant Orders   Ambulatory referral to Endocrinology    Follow-up:  Return in about 3 months (around 12/16/2022).  Everlene Other DO [facility]

## 2022-09-18 NOTE — Assessment & Plan Note (Signed)
Stable on current medications.  Continue. 

## 2022-09-18 NOTE — Assessment & Plan Note (Signed)
Uncontrolled. Continue Metformin and Glipizide.  Needs weight loss. Referring to Endo.

## 2022-09-26 ENCOUNTER — Other Ambulatory Visit: Payer: Self-pay | Admitting: Family Medicine

## 2022-10-02 ENCOUNTER — Telehealth: Payer: Self-pay | Admitting: Family Medicine

## 2022-10-02 NOTE — Telephone Encounter (Signed)
Prescription Request  10/02/2022  LOV: 09/15/2022  What is the name of the medication or equipment? Ozempic pen   Have you contacted your pharmacy to request a refill? Yes   Which pharmacy would you like this sent to?  [facility] [place], [place] - [address] [facility] [address] [place] [address] [place] [address] Phone: [phone] Fax: [phone]    Patient notified that their request is being sent to the clinical staff for review and that they should receive a response within 2 business days.   Please advise at [facility] [phone]

## 2022-10-02 NOTE — Telephone Encounter (Signed)
See 08/2022 office note- patient decided not to move forward with script for Ozempic at this time- Patient is not currently on this med

## 2022-10-06 ENCOUNTER — Encounter: Payer: Medicare Other | Admitting: Nurse Practitioner

## 2022-10-12 ENCOUNTER — Other Ambulatory Visit: Payer: Self-pay | Admitting: Family Medicine

## 2022-10-19 DIAGNOSIS — R070 Pain in throat: Secondary | ICD-10-CM | POA: Diagnosis not present

## 2022-10-19 DIAGNOSIS — R051 Acute cough: Secondary | ICD-10-CM | POA: Diagnosis not present

## 2022-10-19 DIAGNOSIS — J0100 Acute maxillary sinusitis, unspecified: Secondary | ICD-10-CM | POA: Diagnosis not present

## 2022-10-23 ENCOUNTER — Other Ambulatory Visit: Payer: Self-pay | Admitting: Family Medicine

## 2022-10-23 DIAGNOSIS — R051 Acute cough: Secondary | ICD-10-CM | POA: Diagnosis not present

## 2022-10-23 DIAGNOSIS — J40 Bronchitis, not specified as acute or chronic: Secondary | ICD-10-CM | POA: Diagnosis not present

## 2022-11-22 ENCOUNTER — Other Ambulatory Visit: Payer: Self-pay | Admitting: Family Medicine

## 2022-11-24 ENCOUNTER — Telehealth: Payer: Self-pay

## 2022-11-24 NOTE — Telephone Encounter (Signed)
Patient dropped off document FMLA, to be filled out by provider. Patient requested to send it via Fax within 7-days. Document is located in providers tray at front office.Please advise at Mobile [phone] (mobile)

## 2022-11-28 DIAGNOSIS — M545 Low back pain, unspecified: Secondary | ICD-10-CM | POA: Diagnosis not present

## 2022-12-12 DIAGNOSIS — H04123 Dry eye syndrome of bilateral lacrimal glands: Secondary | ICD-10-CM | POA: Diagnosis not present

## 2022-12-12 DIAGNOSIS — H40053 Ocular hypertension, bilateral: Secondary | ICD-10-CM | POA: Diagnosis not present

## 2022-12-12 LAB — HM DIABETES EYE EXAM

## 2022-12-15 ENCOUNTER — Encounter: Payer: Self-pay | Admitting: Family Medicine

## 2022-12-15 ENCOUNTER — Ambulatory Visit (INDEPENDENT_AMBULATORY_CARE_PROVIDER_SITE_OTHER): Payer: Medicare Other | Admitting: Family Medicine

## 2022-12-15 VITALS — BP 147/74 | HR 71 | Temp 98.2°F | Ht 66.0 in | Wt 305.0 lb

## 2022-12-15 DIAGNOSIS — Z862 Personal history of diseases of the blood and blood-forming organs and certain disorders involving the immune mechanism: Secondary | ICD-10-CM | POA: Diagnosis not present

## 2022-12-15 DIAGNOSIS — E7800 Pure hypercholesterolemia, unspecified: Secondary | ICD-10-CM | POA: Diagnosis not present

## 2022-12-15 DIAGNOSIS — E1165 Type 2 diabetes mellitus with hyperglycemia: Secondary | ICD-10-CM | POA: Diagnosis not present

## 2022-12-15 DIAGNOSIS — Z7984 Long term (current) use of oral hypoglycemic drugs: Secondary | ICD-10-CM | POA: Diagnosis not present

## 2022-12-15 DIAGNOSIS — I10 Essential (primary) hypertension: Secondary | ICD-10-CM | POA: Diagnosis not present

## 2022-12-15 MED ORDER — GABAPENTIN 100 MG PO CAPS: 100.0000 mg | ORAL_CAPSULE | Freq: Two times a day (BID) | ORAL | 3 refills | Status: DC

## 2022-12-15 MED ORDER — TRIAMTERENE-HCTZ 37.5-25 MG PO CAPS: 1.0000 | ORAL_CAPSULE | Freq: Every day | ORAL | 3 refills | Status: DC

## 2022-12-15 MED ORDER — TRAMADOL HCL 50 MG PO TABS: 50.0000 mg | ORAL_TABLET | Freq: Two times a day (BID) | ORAL | 5 refills | Status: DC | PRN

## 2022-12-15 MED ORDER — GLIPIZIDE 10 MG PO TABS: 10.0000 mg | ORAL_TABLET | Freq: Two times a day (BID) | ORAL | 3 refills | Status: DC

## 2022-12-15 MED ORDER — FLUTICASONE PROPIONATE 50 MCG/ACT NA SUSP: 2.0000 | Freq: Every day | NASAL | 5 refills | Status: DC

## 2022-12-15 MED ORDER — LIDOCAINE 5 % EX PTCH: 1.0000 | MEDICATED_PATCH | CUTANEOUS | 0 refills | Status: DC

## 2022-12-15 MED ORDER — ALBUTEROL SULFATE HFA 108 (90 BASE) MCG/ACT IN AERS: 2.0000 | INHALATION_SPRAY | Freq: Four times a day (QID) | RESPIRATORY_TRACT | 0 refills | Status: DC | PRN

## 2022-12-15 MED ORDER — FUROSEMIDE 20 MG PO TABS: ORAL_TABLET | ORAL | 1 refills | Status: DC

## 2022-12-15 MED ORDER — METOPROLOL TARTRATE 50 MG PO TABS: 50.0000 mg | ORAL_TABLET | Freq: Two times a day (BID) | ORAL | 3 refills | Status: DC

## 2022-12-15 MED ORDER — METFORMIN HCL ER 750 MG PO TB24: ORAL_TABLET | ORAL | 3 refills | Status: DC

## 2022-12-15 NOTE — Patient Instructions (Signed)
Labs today.  Meds refilled.  We will call with results and add medication if needed for diabetes.  Follow up in 3 months.

## 2022-12-16 LAB — CMP14+EGFR
ALT: 14 IU/L (ref 0–32)
AST: 15 IU/L (ref 0–40)
Albumin: 4.1 g/dL (ref 3.9–4.9)
Alkaline Phosphatase: 77 IU/L (ref 44–121)
BUN/Creatinine Ratio: 16 (ref 12–28)
BUN: 10 mg/dL (ref 8–27)
Bilirubin Total: 0.3 mg/dL (ref 0.0–1.2)
CO2: 27 mmol/L (ref 20–29)
Calcium: 9.1 mg/dL (ref 8.7–10.3)
Chloride: 104 mmol/L (ref 96–106)
Creatinine, Ser: 0.62 mg/dL (ref 0.57–1.00)
Globulin, Total: 2.9 g/dL (ref 1.5–4.5)
Glucose: 108 mg/dL — ABNORMAL HIGH (ref 70–99)
Potassium: 4.2 mmol/L (ref 3.5–5.2)
Sodium: 143 mmol/L (ref 134–144)
Total Protein: 7 g/dL (ref 6.0–8.5)
eGFR: 97 mL/min/{1.73_m2} (ref >59)

## 2022-12-16 LAB — CBC
Hematocrit: 37.9 % (ref 34.0–46.6)
Hemoglobin: 12 g/dL (ref 11.1–15.9)
MCH: 27.7 pg (ref 26.6–33.0)
MCHC: 31.7 g/dL (ref 31.5–35.7)
MCV: 88 fL (ref 79–97)
Platelets: 292 10*3/uL (ref 150–450)
RBC: 4.33 x10E6/uL (ref 3.77–5.28)
RDW: 14.4 % (ref 11.7–15.4)
WBC: 7.9 10*3/uL (ref 3.4–10.8)

## 2022-12-16 LAB — HEMOGLOBIN A1C
Est. average glucose Bld gHb Est-mCnc: 197 mg/dL
Hgb A1c MFr Bld: 8.5 % — ABNORMAL HIGH (ref 4.8–5.6)

## 2022-12-16 LAB — LIPID PANEL
Chol/HDL Ratio: 2.5 ratio (ref 0.0–4.4)
Cholesterol, Total: 140 mg/dL (ref 100–199)
HDL: 55 mg/dL (ref >39)
LDL Chol Calc (NIH): 63 mg/dL (ref 0–99)
Triglycerides: 122 mg/dL (ref 0–149)
VLDL Cholesterol Cal: 22 mg/dL (ref 5–40)

## 2022-12-16 LAB — MICROALBUMIN / CREATININE URINE RATIO
Creatinine, Urine: 171.1 mg/dL
Microalb/Creat Ratio: 49 mg/g creat — ABNORMAL HIGH (ref 0–29)
Microalbumin, Urine: 84.5 ug/mL

## 2022-12-16 LAB — IRON,TIBC AND FERRITIN PANEL
Ferritin: 331 ng/mL — ABNORMAL HIGH (ref 15–150)
Iron Saturation: 12 % — ABNORMAL LOW (ref 15–55)
Iron: 37 ug/dL (ref 27–139)
Total Iron Binding Capacity: 318 ug/dL (ref 250–450)
UIBC: 281 ug/dL (ref 118–369)

## 2022-12-17 MED ORDER — EMPAGLIFLOZIN 10 MG PO TABS
10.0000 mg | ORAL_TABLET | Freq: Every day | ORAL | 1 refills | Status: DC
Start: 2022-12-17 — End: 2023-03-24

## 2022-12-17 NOTE — Assessment & Plan Note (Signed)
Worsening and uncontrolled.  A1c 8.5.  Pharmacy has not allowed [place] to use different GLP-1's due to prior reaction to Victoza.  Given proteinuria noted on labs starting SGLT2.

## 2022-12-17 NOTE — Assessment & Plan Note (Signed)
BP elevated today.  Needs weight loss.  Continue triamterene/HCTZ, Lasix, and metoprolol.  If continues to be elevated we will need addition of hydralazine given the fact that we cannot use ACE inhibitors, ARB, or calcium channel blockers.

## 2022-12-17 NOTE — Progress Notes (Signed)
Subjective:  Patient ID: Tracey Turner, female    DOB: February 08, 1956  Age: 67 y.o. MRN: [id number]  CC: Chief Complaint  Patient presents with   Diabetes   urgent care visit recently    Low back and bronchitis- request refill for albuterol inhaler    HPI:  67 year old female with morbid obesity, hypertension, GERD, uncontrolled type 2 diabetes, hyperlipidemia presents for follow-up.  Patient needs A1c.  She is currently on metformin XR and glipizide.  Given intolerance to Victoza, pharmacy will not allow other GLP-1 trials.  Will discuss treatment options to aid her blood sugar.  Patient was supposed to see endocrinology but did not do so.  BP elevated here today.  She is on triamterene/HCTZ Lasix, and metoprolol.  Had severe allergy to ACE inhibitors and did not tolerate amlodipine due to lower extremity edema.  Avoiding ARB's as well given severe reaction to ACE inhibitors.  Would greatly benefit from weight loss.  Lipids at goal.  Requesting albuterol inhaler.  Patient Active Problem List   Diagnosis Date Noted   Low back pain 11/25/2021   Hyperlipidemia 04/15/2021   Varicose veins of both legs with edema 02/02/2020   Ocular hypertension 12/05/2019   GERD (gastroesophageal reflux disease) 06/15/2014   Morbid obesity (HCC) 12/18/2013   Uncontrolled type 2 diabetes mellitus with hyperglycemia (HCC) 09/12/2012   Essential hypertension 09/12/2012   Arthritis of knee 02/23/2011    Social Hx   Social History   Socioeconomic History   Marital status: Single    Spouse name: Not on file   Number of children: Not on file   Years of education: some coll.   Highest education level: Not on file  Occupational History   Occupation: [employer]    Employer: [employer]  Tobacco Use   Smoking status: Former    Packs/day: 1.00    Years: 30.00    Additional pack years: 0.00    Total pack years: 30.00    Types: Cigarettes    Start date: 07/22/1970    Quit date: 06/21/2007     Years since quitting: 15.5   Smokeless tobacco: Never  Vaping Use   Vaping Use: Never used  Substance and Sexual Activity   Alcohol use: No    Alcohol/week: 0.0 standard drinks of alcohol   Drug use: No   Sexual activity: Not Currently    Birth control/protection: Post-menopausal  Other Topics Concern   Not on file  Social History Narrative   Not on file   Social Determinants of Health   Financial Resource Strain: Low Risk  (05/17/2022)   Overall Financial Resource Strain (CARDIA)    Difficulty of Paying Living Expenses: Not hard at all  Food Insecurity: Not on file  Transportation Needs: No Transportation Needs (05/17/2022)   PRAPARE - Administrator, Civil Service (Medical): No    Lack of Transportation (Non-Medical): No  Physical Activity: Not on file  Stress: Not on file  Social Connections: Not on file    Review of Systems  Constitutional: Negative.   Respiratory: Negative.    Cardiovascular: Negative.    Objective:  BP (!) 147/74   Pulse 71   Temp 98.2 F (36.8 C)   Ht 5\' 6"  (1.676 m)   Wt (!) 305 lb (138.3 kg)   SpO2 95%   BMI 49.23 kg/m      12/15/2022    1:55 PM 12/15/2022    1:39 PM 12/15/2022    1:05 PM  BP/Weight  Systolic BP 147 145 157  Diastolic BP 74 74 79  Wt. (Lbs)   305  BMI   49.23 kg/m2    Physical Exam Constitutional:      Appearance: Normal appearance. She is obese.  HENT:     Head: Normocephalic and atraumatic.  Eyes:     General:        Right eye: No discharge.        Left eye: No discharge.     Conjunctiva/sclera: Conjunctivae normal.  Cardiovascular:     Rate and Rhythm: Normal rate and regular rhythm.  Pulmonary:     Effort: Pulmonary effort is normal.     Breath sounds: Normal breath sounds. No wheezing or rales.  Neurological:     Mental Status: She is alert.  Psychiatric:        Mood and Affect: Mood normal.        Behavior: Behavior normal.     Lab Results  Component Value Date   WBC 7.9  12/15/2022   HGB 12.0 12/15/2022   HCT 37.9 12/15/2022   PLT 292 12/15/2022   GLUCOSE 108 (H) 12/15/2022   CHOL 140 12/15/2022   TRIG 122 12/15/2022   HDL 55 12/15/2022   LDLCALC 63 12/15/2022   ALT 14 12/15/2022   AST 15 12/15/2022   NA 143 12/15/2022   K 4.2 12/15/2022   CL 104 12/15/2022   CREATININE 0.62 12/15/2022   BUN 10 12/15/2022   CO2 27 12/15/2022   TSH 0.725 06/03/2021   HGBA1C 8.5 (H) 12/15/2022   MICROALBUR 2.53 (H) 09/22/2013     Assessment & Plan:   Problem List Items Addressed This Visit       Cardiovascular and Mediastinum   Essential hypertension    BP elevated today.  Needs weight loss.  Continue triamterene/HCTZ, Lasix, and metoprolol.  If continues to be elevated we will need addition of hydralazine given the fact that we cannot use ACE inhibitors, ARB, or calcium channel blockers.      Relevant Medications   metoprolol tartrate (LOPRESSOR) 50 MG tablet   furosemide (LASIX) 20 MG tablet   triamterene-hydrochlorothiazide (DYAZIDE) 37.5-25 MG capsule     Endocrine   Uncontrolled type 2 diabetes mellitus with hyperglycemia (HCC) - Primary    Worsening and uncontrolled.  A1c 8.5.  Pharmacy has not allowed [place] to use different GLP-1's due to prior reaction to Victoza.  Given proteinuria noted on labs starting SGLT2.      Relevant Medications   metFORMIN (GLUCOPHAGE XR) 750 MG 24 hr tablet   glipiZIDE (GLUCOTROL) 10 MG tablet   empagliflozin (JARDIANCE) 10 MG TABS tablet   Other Relevant Orders   CMP14+EGFR (Completed)   Hemoglobin A1c (Completed)   Microalbumin / creatinine urine ratio (Completed)     Other   Hyperlipidemia   Relevant Medications   metoprolol tartrate (LOPRESSOR) 50 MG tablet   furosemide (LASIX) 20 MG tablet   triamterene-hydrochlorothiazide (DYAZIDE) 37.5-25 MG capsule   Other Relevant Orders   Lipid panel (Completed)   Other Visit Diagnoses     History of anemia       Relevant Orders   CBC (Completed)   Iron, TIBC  and Ferritin Panel (Completed)       Meds ordered this encounter  Medications   lidocaine (LIDODERM) 5 %    Sig: Place 1 patch onto the skin daily. Remove & Discard patch within 12 hours.    Dispense:  30 patch  Refill:  0   DISCONTD: triamterene-hydrochlorothiazide (DYAZIDE) 37.5-25 MG capsule    Sig: Take 1 each (1 capsule total) by mouth daily.    Dispense:  90 capsule    Refill:  3   DISCONTD: traMADol (ULTRAM) 50 MG tablet    Sig: Take 1 tablet (50 mg total) by mouth every 12 (twelve) hours as needed for moderate pain or severe pain.    Dispense:  60 tablet    Refill:  5   DISCONTD: glipiZIDE (GLUCOTROL) 10 MG tablet    Sig: Take 1 tablet (10 mg total) by mouth 2 (two) times daily before a meal.    Dispense:  180 tablet    Refill:  3   DISCONTD: gabapentin (NEURONTIN) 100 MG capsule    Sig: Take 1 capsule (100 mg total) by mouth 2 (two) times daily.    Dispense:  180 capsule    Refill:  3   DISCONTD: furosemide (LASIX) 20 MG tablet    Sig: TAKE 1 TABLET BY MOUTH ONCE DAILY AS NEEDED FOR EDEMA    Dispense:  90 tablet    Refill:  1   albuterol (VENTOLIN HFA) 108 (90 Base) MCG/ACT inhaler    Sig: Inhale 2 puffs into the lungs every 6 (six) hours as needed for wheezing or shortness of breath.    Dispense:  8 g    Refill:  0   fluticasone (FLONASE) 50 MCG/ACT nasal spray    Sig: Place 2 sprays into both nostrils daily.    Dispense:  48 g    Refill:  5   metFORMIN (GLUCOPHAGE XR) 750 MG 24 hr tablet    Sig: Take one tablet po daily with food    Dispense:  90 tablet    Refill:  3   metoprolol tartrate (LOPRESSOR) 50 MG tablet    Sig: Take 1 tablet (50 mg total) by mouth 2 (two) times daily.    Dispense:  180 tablet    Refill:  3   furosemide (LASIX) 20 MG tablet    Sig: TAKE 1 TABLET BY MOUTH ONCE DAILY AS NEEDED FOR EDEMA    Dispense:  90 tablet    Refill:  1   gabapentin (NEURONTIN) 100 MG capsule    Sig: Take 1 capsule (100 mg total) by mouth 2 (two) times  daily.    Dispense:  180 capsule    Refill:  3   glipiZIDE (GLUCOTROL) 10 MG tablet    Sig: Take 1 tablet (10 mg total) by mouth 2 (two) times daily before a meal.    Dispense:  180 tablet    Refill:  3   traMADol (ULTRAM) 50 MG tablet    Sig: Take 1 tablet (50 mg total) by mouth every 12 (twelve) hours as needed for moderate pain or severe pain.    Dispense:  60 tablet    Refill:  5   triamterene-hydrochlorothiazide (DYAZIDE) 37.5-25 MG capsule    Sig: Take 1 each (1 capsule total) by mouth daily.    Dispense:  90 capsule    Refill:  3   empagliflozin (JARDIANCE) 10 MG TABS tablet    Sig: Take 1 tablet (10 mg total) by mouth daily.    Dispense:  90 tablet    Refill:  1    Follow-up:  Return in about 3 months (around 03/17/2023).  Everlene Other DO [facility]

## 2022-12-25 ENCOUNTER — Other Ambulatory Visit: Payer: Self-pay | Admitting: Family Medicine

## 2022-12-26 ENCOUNTER — Telehealth: Payer: Self-pay

## 2022-12-26 DIAGNOSIS — E1165 Type 2 diabetes mellitus with hyperglycemia: Secondary | ICD-10-CM

## 2022-12-26 NOTE — Telephone Encounter (Signed)
Pt was taking empagliflozin (JARDIANCE) 10 MG TABS tablet is not working for the patient all she wants to do eat and she said that she wants to see if Dr Adriana Simas put her on something else. Pt is aware that Dr Adriana Simas is out of the office till next week before he can get back with her.   Giani Winther [phone]

## 2022-12-27 ENCOUNTER — Encounter: Payer: Self-pay | Admitting: *Deleted

## 2023-01-02 NOTE — Telephone Encounter (Signed)
Referral ordered in EPIC. Telephone call no answer to notify patent

## 2023-01-02 NOTE — Telephone Encounter (Signed)
Tracey Sams, DO     I recommend Endo referral.

## 2023-01-03 NOTE — Telephone Encounter (Signed)
Messaged via mychart.

## 2023-01-05 DIAGNOSIS — I83893 Varicose veins of bilateral lower extremities with other complications: Secondary | ICD-10-CM | POA: Diagnosis not present

## 2023-01-05 DIAGNOSIS — I890 Lymphedema, not elsewhere classified: Secondary | ICD-10-CM | POA: Diagnosis not present

## 2023-01-05 DIAGNOSIS — R600 Localized edema: Secondary | ICD-10-CM | POA: Diagnosis not present

## 2023-01-09 ENCOUNTER — Other Ambulatory Visit: Payer: Self-pay | Admitting: Family Medicine

## 2023-01-26 DIAGNOSIS — I83891 Varicose veins of right lower extremities with other complications: Secondary | ICD-10-CM | POA: Diagnosis not present

## 2023-02-02 DIAGNOSIS — I83892 Varicose veins of left lower extremities with other complications: Secondary | ICD-10-CM | POA: Diagnosis not present

## 2023-02-05 DIAGNOSIS — M79672 Pain in left foot: Secondary | ICD-10-CM | POA: Diagnosis not present

## 2023-02-05 DIAGNOSIS — M7989 Other specified soft tissue disorders: Secondary | ICD-10-CM | POA: Diagnosis not present

## 2023-02-05 DIAGNOSIS — M7732 Calcaneal spur, left foot: Secondary | ICD-10-CM | POA: Diagnosis not present

## 2023-02-08 ENCOUNTER — Other Ambulatory Visit: Payer: Self-pay | Admitting: Family Medicine

## 2023-02-08 DIAGNOSIS — I83891 Varicose veins of right lower extremities with other complications: Secondary | ICD-10-CM | POA: Diagnosis not present

## 2023-02-12 ENCOUNTER — Telehealth: Payer: Self-pay | Admitting: Family Medicine

## 2023-02-12 NOTE — Telephone Encounter (Signed)
Done

## 2023-02-12 NOTE — Telephone Encounter (Signed)
Refill on  lidocaine (LIDODERM) 5 %  send to [facility]

## 2023-02-16 ENCOUNTER — Ambulatory Visit (INDEPENDENT_AMBULATORY_CARE_PROVIDER_SITE_OTHER): Payer: Medicare Other

## 2023-02-16 VITALS — Ht 66.0 in | Wt 305.0 lb

## 2023-02-16 DIAGNOSIS — Z1231 Encounter for screening mammogram for malignant neoplasm of breast: Secondary | ICD-10-CM

## 2023-02-16 DIAGNOSIS — Z0000 Encounter for general adult medical examination without abnormal findings: Secondary | ICD-10-CM | POA: Diagnosis not present

## 2023-02-16 NOTE — Progress Notes (Signed)
Because this visit was a virtual/telehealth visit,  certain criteria was not obtained, such a blood pressure, CBG if patient is a diabetic, and timed get up and go. Any medications not marked as "taking" was not mentioned during the medication reconciliation part of the visit. Any vitals not documented were not able to be obtained due to this being a telehealth visit. Vitals that have been documented are verbally provided by the patient.  Patient was unable to self-report a recent blood pressure reading due to a lack of equipment at home via telehealth.  Subjective:   Tracey Turner is a 67 y.o. female who presents for an Initial Medicare Annual Wellness Visit.  Visit Complete: Virtual  I connected with  Tracey Turner on 02/16/23 by a audio enabled telemedicine application and verified that I am speaking with the correct person using two identifiers.  Patient Location: Home  Provider Location: Home Office  I discussed the limitations of evaluation and management by telemedicine. The patient expressed understanding and agreed to proceed.  Patient Medicare AWV questionnaire was completed by the patient on na; I have confirmed that all information answered by patient is correct and no changes since this date.  Review of Systems     Cardiac Risk Factors include: advanced age (>40men, >45 women);diabetes mellitus;dyslipidemia;hypertension;obesity (BMI >30kg/m2);sedentary lifestyle     Objective:    Today's Vitals   02/16/23 1446 02/16/23 1449  Weight: (!) 305 lb (138.3 kg)   Height: 5\' 6"  (1.676 m)   PainSc:  3    Body mass index is 49.23 kg/m.     02/16/2023    2:46 PM  Advanced Directives  Does Patient Have a Medical Advance Directive? No  Would patient like information on creating a medical advance directive? No - Patient declined    Current Medications (verified) Outpatient Encounter Medications as of 02/16/2023  Medication Sig   acetaminophen (TYLENOL) 650 MG CR tablet Take  650 mg by mouth every 8 (eight) hours as needed. arthritis   albuterol (VENTOLIN HFA) 108 (90 Base) MCG/ACT inhaler INHALE TWO PUFFS EVERY 6 HOURS AS NEEDED FOR WHEEZING OR SHORTNESS OF BREATH   Calcium Carbonate-Vitamin D (CALCIUM + D PO) Take by mouth daily.    cyclobenzaprine (FLEXERIL) 10 MG tablet Take 1 tablet (10 mg total) by mouth 3 (three) times daily as needed for muscle spasms. Muscle spasms   ferrous sulfate dried (SLOW FE) 160 (50 FE) MG TBCR Take 160 mg by mouth 3 (three) times daily with meals.   fluticasone (FLONASE) 50 MCG/ACT nasal spray Place 2 sprays into both nostrils daily.   furosemide (LASIX) 20 MG tablet TAKE 1 TABLET BY MOUTH ONCE DAILY AS NEEDED FOR EDEMA   gabapentin (NEURONTIN) 100 MG capsule Take 1 capsule (100 mg total) by mouth 2 (two) times daily.   glipiZIDE (GLUCOTROL) 10 MG tablet Take 1 tablet (10 mg total) by mouth 2 (two) times daily before a meal.   latanoprost (XALATAN) 0.005 % ophthalmic solution INSTILL 1 DROP IN EACH EYE  AT BEDTIME   lidocaine (LIDODERM) 5 % APPLY 1 PATCH ONCE A DAY FOR 12 HOURS ON AND 12 HOURS OFF   metFORMIN (GLUCOPHAGE XR) 750 MG 24 hr tablet Take one tablet po daily with food   metoprolol tartrate (LOPRESSOR) 50 MG tablet Take 1 tablet (50 mg total) by mouth 2 (two) times daily.   Multiple Vitamin (MULTIVITAMIN WITH MINERALS) TABS Take 1 tablet by mouth daily.   traMADol (ULTRAM) 50 MG tablet  Take 1 tablet (50 mg total) by mouth every 12 (twelve) hours as needed for moderate pain or severe pain.   triamterene-hydrochlorothiazide (DYAZIDE) 37.5-25 MG capsule Take 1 each (1 capsule total) by mouth daily.   cetirizine (ZYRTEC) 10 MG tablet Take 10 mg by mouth daily.   empagliflozin (JARDIANCE) 10 MG TABS tablet Take 1 tablet (10 mg total) by mouth daily. (Patient not taking: Reported on 02/16/2023)   No facility-administered encounter medications on file as of 02/16/2023.    Allergies (verified) Ace inhibitors, Protonix  [pantoprazole], Norvasc [amlodipine], Amoxicillin, Cholecalciferol, Nexium [esomeprazole magnesium], Nsaids, Prevacid [lansoprazole], Prilosec [omeprazole], and Victoza [liraglutide]   History: Past Medical History:  Diagnosis Date   Anemia    Arthritis    Back pain    Gastritis    Glaucoma    Hypertension    Knee pain    Type 2 diabetes mellitus (HCC)    Past Surgical History:  Procedure Laterality Date   COLONOSCOPY  02/13/2011   internal hemorrhoids   ESOPHAGOGASTRODUODENOSCOPY  02/13/2011   mild gatritis   GIVENS CAPSULE STUDY  03/28/2012       GIVENS CAPSULE STUDY  03/26/2012   Procedure: GIVENS CAPSULE STUDY;  Surgeon: West Bali, MD;  Location: [facility];  Service: Endoscopy;  Laterality: N/A;  7:30   None     Family History  Problem Relation Age of Onset   Prostate cancer Father    Stroke Father    Hypertension Father    Diabetes Mother    Heart disease Mother    Hypertension Mother    Heart disease Sister    Asthma Sister    Kidney disease Brother    Diabetes Brother    Hypertension Brother    Heart disease Brother    Diabetes Brother    Heart disease Brother    Diabetes Brother    Heart disease Brother    Heart disease Unknown    Arthritis Unknown    Cancer Unknown    Diabetes Unknown    Kidney disease Unknown    Colon cancer Neg Hx    Colon polyps Neg Hx    Social History   Socioeconomic History   Marital status: Single    Spouse name: Not on file   Number of children: Not on file   Years of education: some coll.   Highest education level: Not on file  Occupational History   Occupation: [employer]    Employer: [employer]  Tobacco Use   Smoking status: Former    Current packs/day: 0.00    Average packs/day: 1 pack/day for 36.9 years (36.9 ttl pk-yrs)    Types: Cigarettes    Start date: 07/22/1970    Quit date: 06/21/2007    Years since quitting: 15.6   Smokeless tobacco: Never  Vaping Use   Vaping status: Never Used  Substance  and Sexual Activity   Alcohol use: No    Alcohol/week: 0.0 standard drinks of alcohol   Drug use: No   Sexual activity: Not Currently    Birth control/protection: Post-menopausal  Other Topics Concern   Not on file  Social History Narrative   Not on file   Social Determinants of Health   Financial Resource Strain: Low Risk  (02/16/2023)   Overall Financial Resource Strain (CARDIA)    Difficulty of Paying Living Expenses: Not hard at all  Food Insecurity: No Food Insecurity (02/16/2023)   Hunger Vital Sign    Worried About Running Out of Food  in the Last Year: Never true    Ran Out of Food in the Last Year: Never true  Transportation Needs: No Transportation Needs (02/16/2023)   PRAPARE - Administrator, Civil Service (Medical): No    Lack of Transportation (Non-Medical): No  Physical Activity: Inactive (02/16/2023)   Exercise Vital Sign    Days of Exercise per Week: 0 days    Minutes of Exercise per Session: 0 min  Stress: No Stress Concern Present (02/16/2023)   [employer] of Occupational Health - Occupational Stress Questionnaire    Feeling of Stress : Not at all  Social Connections: Moderately Isolated (02/16/2023)   Social Connection and Isolation Panel [NHANES]    Frequency of Communication with Friends and Family: More than three times a week    Frequency of Social Gatherings with Friends and Family: More than three times a week    Attends Religious Services: More than 4 times per year    Active Member of [employer] or Organizations: No    Attends Engineer, structural: Never    Marital Status: Never married    Tobacco Counseling Counseling given: Yes   Clinical Intake:  Pre-visit preparation completed: Yes  Pain : 0-10 Pain Score: 3  Pain Type: Chronic pain Pain Location: Leg Pain Orientation: Left, Right Pain Descriptors / Indicators: Constant, Throbbing, Aching Pain Onset: More than a month ago Pain Frequency: Constant     BMI -  recorded: 49.23 Nutritional Status: BMI > 30  Obese Nutritional Risks: None Diabetes: Yes CBG done?: No Did pt. bring in CBG monitor from home?: No  How often do you need to have someone help you when you read instructions, pamphlets, or other written materials from your doctor or pharmacy?: 1 - Never  Interpreter Needed?: No  Information entered by :: Abby Resha Filippone, CMA   Activities of Daily Living    02/16/2023    2:56 PM  In your present state of health, do you have any difficulty performing the following activities:  Hearing? 0  Vision? 0  Difficulty concentrating or making decisions? 0  Walking or climbing stairs? 1  Comment chronic leg pain  Dressing or bathing? 0  Doing errands, shopping? 0  Preparing Food and eating ? N  Using the Toilet? N  In the past six months, have you accidently leaked urine? N  Do you have problems with loss of bowel control? N  Managing your Medications? N  Managing your Finances? N  Housekeeping or managing your Housekeeping? N    Patient Care Team: Tommie Sams, DO as PCP - General (Family Medicine) West Bali, MD (Inactive) (Gastroenterology)  Indicate any recent Medical Services you may have received from other than [facility] providers in the past year (date may be approximate).     Assessment:   This is a routine wellness examination for [facility].  Hearing/Vision screen Hearing Screening - Comments:: Patient denies any hearing difficulties.   Vision Screening - Comments:: Up to date on yearly eye exams. [facility], Dr. Noel Gerold  Dietary issues and exercise activities discussed:     Goals Addressed             This Visit's Progress    Patient Stated       Lose weight       Depression Screen    02/16/2023    2:52 PM 12/15/2022    1:52 PM 09/15/2022    1:43 PM 06/16/2022    1:18 PM 04/15/2021  8:29 AM 12/07/2020    9:03 AM 06/08/2020   11:01 AM  PHQ 2/9 Scores  PHQ - 2 Score 0 0 0 0 0 0 0  PHQ- 9 Score  0  0        Fall Risk    02/16/2023    2:55 PM 09/15/2022    1:42 PM 06/16/2022    1:18 PM 04/15/2021    8:29 AM 12/07/2020    9:02 AM  Fall Risk   Falls in the past year? 0 0 0 0 0  Number falls in past yr: 0 0   0  Injury with Fall? 0 0   0  Risk for fall due to : No Fall Risks  No Fall Risks No Fall Risks No Fall Risks  Follow up Falls prevention discussed  Falls evaluation completed Falls evaluation completed Falls evaluation completed    MEDICARE RISK AT HOME: Medicare Risk at Home Any stairs in or around the home?: No If so, are there any without handrails?: No Home free of loose throw rugs in walkways, pet beds, electrical cords, etc?: Yes Adequate lighting in your home to reduce risk of falls?: Yes Life alert?: No Use of a cane, walker or w/c?: Yes Grab bars in the bathroom?: Yes Shower chair or bench in shower?: No Elevated toilet seat or a handicapped toilet?: Yes  TIMED UP AND GO:  Was the test performed? No    Cognitive Function:        02/16/2023    2:52 PM  6CIT Screen  What Year? 0 points  What month? 0 points  What time? 0 points  Count back from 20 0 points  Months in reverse 0 points  Repeat phrase 0 points  Total Score 0 points    Immunizations Immunization History  Administered Date(s) Administered   Fluad Quad(high Dose 65+) 04/15/2021   Influenza,inj,Quad PF,6+ Mos 03/13/2014, 03/29/2015, 03/02/2022   Influenza-Unspecified 04/09/2013, 03/27/2017, 03/19/2018, 04/27/2020   Moderna Sars-Covid-2 Vaccination 09/23/2019, 10/21/2019, 05/25/2020   Pneumococcal Conjugate-13 01/30/2020   Pneumococcal Polysaccharide-23 04/15/2021   Tdap 04/30/2018   Zoster Recombinant(Shingrix) 06/03/2021   Zoster, Live 02/11/2013    TDAP status: Up to date  Flu Vaccine status: Due, Education has been provided regarding the importance of this vaccine. Advised may receive this vaccine at local pharmacy or Health Dept. Aware to provide a copy of the vaccination  record if obtained from local pharmacy or Health Dept. Verbalized acceptance and understanding.  Pneumococcal vaccine status: Up to date  Covid-19 vaccine status: Information provided on how to obtain vaccines.   Qualifies for Shingles Vaccine? Yes   Zostavax completed yes   Shingrix Completed?: Yes  Screening Tests Health Maintenance  Topic Date Due   Medicare Annual Wellness (AWV)  Never done   COVID-19 Vaccine (5 - 2023-24 season) 02/17/2022   INFLUENZA VACCINE  01/18/2023   HEMOGLOBIN A1C  06/16/2023   FOOT EXAM  09/15/2023   OPHTHALMOLOGY EXAM  12/12/2023   Diabetic kidney evaluation - eGFR measurement  12/15/2023   Diabetic kidney evaluation - Urine ACR  12/15/2023   MAMMOGRAM  03/20/2024   Fecal DNA (Cologuard)  08/17/2025   DTaP/Tdap/Td (2 - Td or Tdap) 04/30/2028   Pneumonia Vaccine 66+ Years old  Completed   DEXA SCAN  Completed   Hepatitis C Screening  Completed   Zoster Vaccines- Shingrix  Completed   HPV VACCINES  Aged Out   Colonoscopy  Discontinued    Health Maintenance  Health Maintenance  Due  Topic Date Due   Medicare Annual Wellness (AWV)  Never done   COVID-19 Vaccine (5 - 2023-24 season) 02/17/2022   INFLUENZA VACCINE  01/18/2023    Colorectal cancer screening: Type of screening: Cologuard. Completed 08/18/2022. Repeat every 3 years  Mammogram status: Ordered 02/16/2023. Pt provided with contact info and advised to call to schedule appt.   Bone Density status: Completed 02/06/2020. Results reflect: Bone density results: NORMAL. Repeat every 5 years.  Lung Cancer Screening: (Low Dose CT Chest recommended if Age 68-80 years, 20 pack-year currently smoking OR have quit w/in 15years.) does not qualify.   Lung Cancer Screening Referral: na  Additional Screening:  Hepatitis C Screening: does not qualify; Completed 07/05/2017  Vision Screening: Recommended annual ophthalmology exams for early detection of glaucoma and other disorders of the eye. Is the  patient up to date with their annual eye exam?  Yes  Who is the provider or what is the name of the office in which the patient attends annual eye exams? Dr Weldon Picking If pt is not established with a provider, would they like to be referred to a provider to establish care? No .   Dental Screening: Recommended annual dental exams for proper oral hygiene  Diabetic Foot Exam: Diabetic Foot Exam: Completed 09/15/2022  Community Resource Referral / Chronic Care Management: CRR required this visit?  No   CCM required this visit?  No     Plan:     I have personally reviewed and noted the following in the patient's chart:   Medical and social history Use of alcohol, tobacco or illicit drugs  Current medications and supplements including opioid prescriptions. Patient is currently taking opioid prescriptions. Information provided to patient regarding non-opioid alternatives. Patient advised to discuss non-opioid treatment plan with their provider. Functional ability and status Nutritional status Physical activity Advanced directives List of other physicians Hospitalizations, surgeries, and ER visits in previous 12 months Vitals Screenings to include cognitive, depression, and falls Referrals and appointments  In addition, I have reviewed and discussed with patient certain preventive protocols, quality metrics, and best practice recommendations. A written personalized care plan for preventive services as well as general preventive health recommendations were provided to patient.     Jordan Hawks Secilia Apps, CMA   02/16/2023   After Visit Summary: (MyChart) Due to this being a telephonic visit, the after visit summary with patients personalized plan was offered to patient via MyChart   Nurse Notes:

## 2023-02-16 NOTE — Patient Instructions (Signed)
Tracey Turner , Thank you for taking time to come for your Medicare Wellness Visit. I appreciate your ongoing commitment to your health goals. Please review the following plan we discussed and let me know if I can assist you in the future.   Referrals/Orders/Follow-Ups/Clinician Recommendations:  You have an order for:  []   2D Mammogram  [x]   3D Mammogram  []   Bone Density   []   Lung Cancer Screening  Please call for appointment:   [facility] [address] [place], [place] [address] [phone]  Make sure to wear two-piece clothing.  No lotions powders or deodorants the day of the appointment Make sure to bring picture ID and insurance card.  Bring list of medications you are currently taking including any supplements.   Schedule your [place] screening mammogram through MyChart!   Log into your MyChart account.  Go to 'Visit' (or 'Appointments' if on mobile App) --> Schedule an Appointment  Under 'Select a Reason for Visit' choose the Mammogram Screening option.  Complete the pre-visit questions and select the time and place that best fits your schedule.    This is a list of the screening recommended for you and due dates:  Health Maintenance  Topic Date Due   COVID-19 Vaccine (5 - 2023-24 season) 02/17/2022   Flu Shot  01/18/2023   Mammogram  03/21/2023   Hemoglobin A1C  06/16/2023   Complete foot exam   09/15/2023   Eye exam for diabetics  12/12/2023   Yearly kidney function blood test for diabetes  12/15/2023   Yearly kidney health urinalysis for diabetes  12/15/2023   Medicare Annual Wellness Visit  02/16/2024   DEXA scan (bone density measurement)  02/05/2025   Cologuard (Stool DNA test)  08/17/2025   DTaP/Tdap/Td vaccine (2 - Td or Tdap) 04/30/2028   Pneumonia Vaccine  Completed   Hepatitis C Screening  Completed   Zoster (Shingles) Vaccine  Completed   HPV Vaccine  Aged Out   Colon Cancer Screening  Discontinued     Advanced directives: (Declined) Advance directive discussed with you today. Even though you declined this today, please call our office should you change your mind, and we can give you the proper paperwork for you to fill out.  Next Medicare Annual Wellness Visit scheduled for next year: Yes  Preventive Care 60 Years and Older, Female Preventive care refers to lifestyle choices and visits with your health care provider that can promote health and wellness. Preventive care visits are also called wellness exams. What can I expect for my preventive care visit? Counseling Your health care provider may ask you questions about your: Medical history, including: Past medical problems. Family medical history. Pregnancy and menstrual history. History of falls. Current health, including: Memory and ability to understand (cognition). Emotional well-being. Home life and relationship well-being. Sexual activity and sexual health. Lifestyle, including: Alcohol, nicotine or tobacco, and drug use. Access to firearms. Diet, exercise, and sleep habits. Work and work Astronomer. Sunscreen use. Safety issues such as seatbelt and bike helmet use. Physical exam Your health care provider will check your: Height and weight. These may be used to calculate your BMI (body mass index). BMI is a measurement that tells if you are at a healthy weight. Waist circumference. This measures the distance around your waistline. This measurement also tells if you are at a healthy weight and may help predict your risk of certain diseases, such as type 2 diabetes and high blood  pressure. Heart rate and blood pressure. Body temperature. Skin for abnormal spots. What immunizations do I need?  Vaccines are usually given at various ages, according to a schedule. Your health care provider will recommend vaccines for you based on your age, medical history, and lifestyle or other factors, such as travel or where you  work. What tests do I need? Screening Your health care provider may recommend screening tests for certain conditions. This may include: Lipid and cholesterol levels. Hepatitis C test. Hepatitis B test. HIV (human immunodeficiency virus) test. STI (sexually transmitted infection) testing, if you are at risk. Lung cancer screening. Colorectal cancer screening. Diabetes screening. This is done by checking your blood sugar (glucose) after you have not eaten for a while (fasting). Mammogram. Talk with your health care provider about how often you should have regular mammograms. BRCA-related cancer screening. This may be done if you have a family history of breast, ovarian, tubal, or peritoneal cancers. Bone density scan. This is done to screen for osteoporosis. Talk with your health care provider about your test results, treatment options, and if necessary, the need for more tests. Follow these instructions at home: Eating and drinking  Eat a diet that includes fresh fruits and vegetables, whole grains, lean protein, and low-fat dairy products. Limit your intake of foods with high amounts of sugar, saturated fats, and salt. Take vitamin and mineral supplements as recommended by your health care provider. Do not drink alcohol if your health care provider tells you not to drink. If you drink alcohol: Limit how much you have to 0-1 drink a day. Know how much alcohol is in your drink. In the [place] one drink equals one 12 oz bottle of beer (355 mL), one 5 oz glass of wine (148 mL), or one 1 oz glass of hard liquor (44 mL). Lifestyle Brush your teeth every morning and night with fluoride toothpaste. Floss one time each day. Exercise for at least 30 minutes 5 or more days each week. Do not use any products that contain nicotine or tobacco. These products include cigarettes, chewing tobacco, and vaping devices, such as e-cigarettes. If you need help quitting, ask your health care provider. Do not  use drugs. If you are sexually active, practice safe sex. Use a condom or other form of protection in order to prevent STIs. Take aspirin only as told by your health care provider. Make sure that you understand how much to take and what form to take. Work with your health care provider to find out whether it is safe and beneficial for you to take aspirin daily. Ask your health care provider if you need to take a cholesterol-lowering medicine (statin). Find healthy ways to manage stress, such as: Meditation, yoga, or listening to music. Journaling. Talking to a trusted person. Spending time with friends and family. Minimize exposure to UV radiation to reduce your risk of skin cancer. Safety Always wear your seat belt while driving or riding in a vehicle. Do not drive: If you have been drinking alcohol. Do not ride with someone who has been drinking. When you are tired or distracted. While texting. If you have been using any mind-altering substances or drugs. Wear a helmet and other protective equipment during sports activities. If you have firearms in your house, make sure you follow all gun safety procedures. What's next? Visit your health care provider once a year for an annual wellness visit. Ask your health care provider how often you should have your eyes and teeth checked.  Stay up to date on all vaccines. This information is not intended to replace advice given to you by your health care provider. Make sure you discuss any questions you have with your health care provider. Document Revised: 12/01/2020 Document Reviewed: 12/01/2020 Elsevier Patient Education  2024 [employer]. Understanding Your Risk for Falls Millions of people have serious injuries from falls each year. It is important to understand your risk of falling. Talk with your health care provider about your risk and what you can do to lower it. If you do have a serious fall, make sure to tell your provider. Falling once  raises your risk of falling again. How can falls affect me? Serious injuries from falls are common. These include: Broken bones, such as hip fractures. Head injuries, such as traumatic brain injuries (TBI) or concussions. A fear of falling can cause you to avoid activities and stay at home. This can make your muscles weaker and raise your risk for a fall. What can increase my risk? There are a number of risk factors that increase your risk for falling. The more risk factors you have, the higher your risk of falling. Serious injuries from a fall happen most often to people who are older than 68 years old. Teenagers and young adults ages 69-29 are also at higher risk. Common risk factors include: Weakness in the lower body. Being generally weak or confused due to long-term (chronic) illness. Dizziness or balance problems. Poor vision. Medicines that cause dizziness or drowsiness. These may include: Medicines for your blood pressure, heart, anxiety, insomnia, or swelling (edema). Pain medicines. Muscle relaxants. Other risk factors include: Drinking alcohol. Having had a fall in the past. Having foot pain or wearing improper footwear. Working at a dangerous job. Having any of the following in your home: Tripping hazards, such as floor clutter or loose rugs. Poor lighting. Pets. Having dementia or memory loss. What actions can I take to lower my risk of falling?     Physical activity Stay physically fit. Do strength and balance exercises. Consider taking a regular class to build strength and balance. Yoga and tai chi are good options. Vision Have your eyes checked every year and your prescription for glasses or contacts updated as needed. Shoes and walking aids Wear non-skid shoes. Wear shoes that have rubber soles and low heels. Do not wear high heels. Do not walk around the house in socks or slippers. Use a cane or walker as told by your provider. Home safety Attach secure  railings on both sides of your stairs. Install grab bars for your bathtub, shower, and toilet. Use a non-skid mat in your bathtub or shower. Attach bath mats securely with double-sided, non-slip rug tape. Use good lighting in all rooms. Keep a flashlight near your bed. Make sure there is a clear path from your bed to the bathroom. Use night-lights. Do not use throw rugs. Make sure all carpeting is taped or tacked down securely. Remove all clutter from walkways and stairways, including extension cords. Repair uneven or broken steps and floors. Avoid walking on icy or slippery surfaces. Walk on the grass instead of on icy or slick sidewalks. Use ice melter to get rid of ice on walkways in the winter. Use a cordless phone. Questions to ask your health care provider Can you help me check my risk for a fall? Do any of my medicines make me more likely to fall? Should I take a vitamin D supplement? What exercises can I do to improve  my strength and balance? Should I make an appointment to have my vision checked? Do I need a bone density test to check for weak bones (osteoporosis)? Would it help to use a cane or a walker? Where to find more information Centers for Disease Control and Prevention, STEAD[employer]: TonerPromos.no Community-Based Fall Prevention Programs: TonerPromos.no [employer] on Aging: BaseRingTones.pl Contact a health care provider if: You fall at home. You are afraid of falling at home. You feel weak, drowsy, or dizzy. This information is not intended to replace advice given to you by your health care provider. Make sure you discuss any questions you have with your health care provider. Document Revised: 02/06/2022 Document Reviewed: 02/06/2022 Elsevier Patient Education  2024 Elsevier Inc. Managing Pain Without Opioids Opioids are strong medicines used to treat moderate to severe pain. For some people, especially those who have long-term (chronic) pain, opioids may not be the best choice for  pain management due to: Side effects like nausea, constipation, and sleepiness. The risk of addiction (opioid use disorder). The longer you take opioids, the greater your risk of addiction. Pain that lasts for more than 3 months is called chronic pain. Managing chronic pain usually requires more than one approach and is often provided by a team of health care providers working together (multidisciplinary approach). Pain management may be done at a pain management center or pain clinic. How to manage pain without the use of opioids Use non-opioid medicines Non-opioid medicines for pain may include: Over-the-counter or prescription non-steroidal anti-inflammatory drugs (NSAIDs). These may be the first medicines used for pain. They work well for muscle and bone pain, and they reduce swelling. Acetaminophen. This over-the-counter medicine may work well for milder pain but not swelling. Antidepressants. These may be used to treat chronic pain. A certain type of antidepressant (tricyclics) is often used. These medicines are given in lower doses for pain than when used for depression. Anticonvulsants. These are usually used to treat seizures but may also reduce nerve (neuropathic) pain. Muscle relaxants. These relieve pain caused by sudden muscle tightening (spasms). You may also use a pain medicine that is applied to the skin as a patch, cream, or gel (topical analgesic), such as a numbing medicine. These may cause fewer side effects than medicines taken by mouth. Do certain therapies as directed Some therapies can help with pain management. They include: Physical therapy. You will do exercises to gain strength and flexibility. A physical therapist may teach you exercises to move and stretch parts of your body that are weak, stiff, or painful. You can learn these exercises at physical therapy visits and practice them at home. Physical therapy may also involve: Massage. Heat wraps or applying heat or cold  to affected areas. Electrical signals that interrupt pain signals (transcutaneous electrical nerve stimulation, TENS). Weak lasers that reduce pain and swelling (low-level laser therapy). Signals from your body that help you learn to regulate pain (biofeedback). Occupational therapy. This helps you to learn ways to function at home and work with less pain. Recreational therapy. This involves trying new activities or hobbies, such as a physical activity or drawing. Mental health therapy, including: Cognitive behavioral therapy (CBT). This helps you learn coping skills for dealing with pain. Acceptance and commitment therapy (ACT) to change the way you think and react to pain. Relaxation therapies, including muscle relaxation exercises and mindfulness-based stress reduction. Pain management counseling. This may be individual, family, or group counseling.  Receive medical treatments Medical treatments for pain management include: Nerve block  injections. These may include a pain blocker and anti-inflammatory medicines. You may have injections: Near the spine to relieve chronic back or neck pain. Into joints to relieve back or joint pain. Into nerve areas that supply a painful area to relieve body pain. Into muscles (trigger point injections) to relieve some painful muscle conditions. A medical device placed near your spine to help block pain signals and relieve nerve pain or chronic back pain (spinal cord stimulation device). Acupuncture. Follow these instructions at home Medicines Take over-the-counter and prescription medicines only as told by your health care provider. If you are taking pain medicine, ask your health care providers about possible side effects to watch out for. Do not drive or use heavy machinery while taking prescription opioid pain medicine. Lifestyle  Do not use drugs or alcohol to reduce pain. If you drink alcohol, limit how much you have to: 0-1 drink a day for women  who are not pregnant. 0-2 drinks a day for men. Know how much alcohol is in a drink. In the [place] one drink equals one 12 oz bottle of beer (355 mL), one 5 oz glass of wine (148 mL), or one 1 oz glass of hard liquor (44 mL). Do not use any products that contain nicotine or tobacco. These products include cigarettes, chewing tobacco, and vaping devices, such as e-cigarettes. If you need help quitting, ask your health care provider. Eat a healthy diet and maintain a healthy weight. Poor diet and excess weight may make pain worse. Eat foods that are high in fiber. These include fresh fruits and vegetables, whole grains, and beans. Limit foods that are high in fat and processed sugars, such as fried and sweet foods. Exercise regularly. Exercise lowers stress and may help relieve pain. Ask your health care provider what activities and exercises are safe for you. If your health care provider approves, join an exercise class that combines movement and stress reduction. Examples include yoga and tai chi. Get enough sleep. Lack of sleep may make pain worse. Lower stress as much as possible. Practice stress reduction techniques as told by your therapist. General instructions Work with all your pain management providers to find the treatments that work best for you. You are an important member of your pain management team. There are many things you can do to reduce pain on your own. Consider joining an online or in-person support group for people who have chronic pain. Keep all follow-up visits. This is important. Where to find more information You can find more information about managing pain without opioids from: American Academy of Pain Medicine: painmed.org Institute for Chronic Pain: instituteforchronicpain.org American Chronic Pain Association: theacpa.org Contact a health care provider if: You have side effects from pain medicine. Your pain gets worse or does not get better with treatments or  home therapy. You are struggling with anxiety or depression. Summary Many types of pain can be managed without opioids. Chronic pain may respond better to pain management without opioids. Pain is best managed when you and a team of health care providers work together. Pain management without opioids may include non-opioid medicines, medical treatments, physical therapy, mental health therapy, and lifestyle changes. Tell your health care providers if your pain gets worse or is not being managed well enough. This information is not intended to replace advice given to you by your health care provider. Make sure you discuss any questions you have with your health care provider. Document Revised: 09/15/2020 Document Reviewed: 09/15/2020 Elsevier Patient Education  2024 Elsevier Inc.

## 2023-02-20 DIAGNOSIS — I83812 Varicose veins of left lower extremities with pain: Secondary | ICD-10-CM | POA: Diagnosis not present

## 2023-02-20 DIAGNOSIS — M7989 Other specified soft tissue disorders: Secondary | ICD-10-CM | POA: Diagnosis not present

## 2023-02-20 DIAGNOSIS — I83892 Varicose veins of left lower extremities with other complications: Secondary | ICD-10-CM | POA: Diagnosis not present

## 2023-02-22 DIAGNOSIS — I83891 Varicose veins of right lower extremities with other complications: Secondary | ICD-10-CM | POA: Diagnosis not present

## 2023-02-22 DIAGNOSIS — I83811 Varicose veins of right lower extremities with pain: Secondary | ICD-10-CM | POA: Diagnosis not present

## 2023-03-08 DIAGNOSIS — I83892 Varicose veins of left lower extremities with other complications: Secondary | ICD-10-CM | POA: Diagnosis not present

## 2023-03-08 DIAGNOSIS — I83812 Varicose veins of left lower extremities with pain: Secondary | ICD-10-CM | POA: Diagnosis not present

## 2023-03-21 ENCOUNTER — Other Ambulatory Visit: Payer: Self-pay | Admitting: Nurse Practitioner

## 2023-03-23 ENCOUNTER — Ambulatory Visit: Payer: Medicare Other | Admitting: Family Medicine

## 2023-03-23 DIAGNOSIS — I890 Lymphedema, not elsewhere classified: Secondary | ICD-10-CM | POA: Diagnosis not present

## 2023-03-24 ENCOUNTER — Other Ambulatory Visit: Payer: Self-pay

## 2023-03-24 ENCOUNTER — Emergency Department (HOSPITAL_COMMUNITY): Payer: Medicare Other

## 2023-03-24 ENCOUNTER — Encounter (HOSPITAL_COMMUNITY): Payer: Self-pay | Admitting: Family Medicine

## 2023-03-24 ENCOUNTER — Inpatient Hospital Stay (HOSPITAL_COMMUNITY)
Admission: EM | Admit: 2023-03-24 | Discharge: 2023-03-27 | DRG: 177 | Disposition: A | Discharge status: Home / Self Care | Payer: Medicare Other | Attending: Internal Medicine | Admitting: Internal Medicine

## 2023-03-24 DIAGNOSIS — Z7952 Long term (current) use of systemic steroids: Secondary | ICD-10-CM

## 2023-03-24 DIAGNOSIS — J1282 Pneumonia due to coronavirus disease 2019: Secondary | ICD-10-CM | POA: Diagnosis not present

## 2023-03-24 DIAGNOSIS — Z794 Long term (current) use of insulin: Secondary | ICD-10-CM | POA: Diagnosis not present

## 2023-03-24 DIAGNOSIS — E1165 Type 2 diabetes mellitus with hyperglycemia: Secondary | ICD-10-CM

## 2023-03-24 DIAGNOSIS — E1369 Other specified diabetes mellitus with other specified complication: Secondary | ICD-10-CM | POA: Diagnosis not present

## 2023-03-24 DIAGNOSIS — R0602 Shortness of breath: Secondary | ICD-10-CM | POA: Diagnosis not present

## 2023-03-24 DIAGNOSIS — R0989 Other specified symptoms and signs involving the circulatory and respiratory systems: Secondary | ICD-10-CM | POA: Diagnosis not present

## 2023-03-24 DIAGNOSIS — Z7984 Long term (current) use of oral hypoglycemic drugs: Secondary | ICD-10-CM | POA: Diagnosis not present

## 2023-03-24 DIAGNOSIS — I5031 Acute diastolic (congestive) heart failure: Secondary | ICD-10-CM | POA: Diagnosis not present

## 2023-03-24 DIAGNOSIS — Z79899 Other long term (current) drug therapy: Secondary | ICD-10-CM

## 2023-03-24 DIAGNOSIS — J9601 Acute respiratory failure with hypoxia: Principal | ICD-10-CM

## 2023-03-24 DIAGNOSIS — I160 Hypertensive urgency: Secondary | ICD-10-CM | POA: Diagnosis present

## 2023-03-24 DIAGNOSIS — Z823 Family history of stroke: Secondary | ICD-10-CM | POA: Diagnosis not present

## 2023-03-24 DIAGNOSIS — M549 Dorsalgia, unspecified: Secondary | ICD-10-CM | POA: Diagnosis present

## 2023-03-24 DIAGNOSIS — Z8249 Family history of ischemic heart disease and other diseases of the circulatory system: Secondary | ICD-10-CM

## 2023-03-24 DIAGNOSIS — Z841 Family history of disorders of kidney and ureter: Secondary | ICD-10-CM

## 2023-03-24 DIAGNOSIS — Z833 Family history of diabetes mellitus: Secondary | ICD-10-CM | POA: Diagnosis not present

## 2023-03-24 DIAGNOSIS — Z87891 Personal history of nicotine dependence: Secondary | ICD-10-CM

## 2023-03-24 DIAGNOSIS — Z8042 Family history of malignant neoplasm of prostate: Secondary | ICD-10-CM | POA: Diagnosis not present

## 2023-03-24 DIAGNOSIS — U071 COVID-19: Principal | ICD-10-CM

## 2023-03-24 DIAGNOSIS — Z6841 Body Mass Index (BMI) 40.0 and over, adult: Secondary | ICD-10-CM | POA: Diagnosis not present

## 2023-03-24 DIAGNOSIS — Z880 Allergy status to penicillin: Secondary | ICD-10-CM | POA: Diagnosis not present

## 2023-03-24 DIAGNOSIS — R062 Wheezing: Secondary | ICD-10-CM | POA: Diagnosis not present

## 2023-03-24 DIAGNOSIS — Z91148 Patient's other noncompliance with medication regimen for other reason: Secondary | ICD-10-CM | POA: Diagnosis not present

## 2023-03-24 DIAGNOSIS — Z760 Encounter for issue of repeat prescription: Secondary | ICD-10-CM | POA: Diagnosis not present

## 2023-03-24 DIAGNOSIS — Z825 Family history of asthma and other chronic lower respiratory diseases: Secondary | ICD-10-CM | POA: Diagnosis not present

## 2023-03-24 DIAGNOSIS — Z888 Allergy status to other drugs, medicaments and biological substances status: Secondary | ICD-10-CM

## 2023-03-24 DIAGNOSIS — I10 Essential (primary) hypertension: Secondary | ICD-10-CM

## 2023-03-24 DIAGNOSIS — I700 Atherosclerosis of aorta: Secondary | ICD-10-CM | POA: Diagnosis not present

## 2023-03-24 DIAGNOSIS — R059 Cough, unspecified: Secondary | ICD-10-CM | POA: Diagnosis not present

## 2023-03-24 DIAGNOSIS — I517 Cardiomegaly: Secondary | ICD-10-CM | POA: Diagnosis not present

## 2023-03-24 DIAGNOSIS — M1990 Unspecified osteoarthritis, unspecified site: Secondary | ICD-10-CM | POA: Diagnosis not present

## 2023-03-24 LAB — BASIC METABOLIC PANEL
Anion gap: 8 (ref 5–15)
BUN: 9 mg/dL (ref 8–23)
CO2: 29 mmol/L (ref 22–32)
Calcium: 8.5 mg/dL — ABNORMAL LOW (ref 8.9–10.3)
Chloride: 99 mmol/L (ref 98–111)
Creatinine, Ser: 0.72 mg/dL (ref 0.44–1.00)
GFR, Estimated: >60 mL/min (ref >60)
Glucose, Bld: 207 mg/dL — ABNORMAL HIGH (ref 70–99)
Potassium: 3.7 mmol/L (ref 3.5–5.1)
Sodium: 136 mmol/L (ref 135–145)

## 2023-03-24 LAB — CBC WITH DIFFERENTIAL/PLATELET
Abs Immature Granulocytes: 0.03 10*3/uL (ref 0.00–0.07)
Basophils Absolute: 0.1 10*3/uL (ref 0.0–0.1)
Basophils Relative: 1 %
Eosinophils Absolute: 0.1 10*3/uL (ref 0.0–0.5)
Eosinophils Relative: 1 %
HCT: 38.9 % (ref 36.0–46.0)
Hemoglobin: 12.3 g/dL (ref 12.0–15.0)
Immature Granulocytes: 0 %
Lymphocytes Relative: 13 %
Lymphs Abs: 1.1 10*3/uL (ref 0.7–4.0)
MCH: 27.6 pg (ref 26.0–34.0)
MCHC: 31.6 g/dL (ref 30.0–36.0)
MCV: 87.2 fL (ref 80.0–100.0)
Monocytes Absolute: 0.9 10*3/uL (ref 0.1–1.0)
Monocytes Relative: 10 %
Neutro Abs: 6.8 10*3/uL (ref 1.7–7.7)
Neutrophils Relative %: 75 %
Platelets: 262 10*3/uL (ref 150–400)
RBC: 4.46 MIL/uL (ref 3.87–5.11)
RDW: 16.3 % — ABNORMAL HIGH (ref 11.5–15.5)
WBC: 9 10*3/uL (ref 4.0–10.5)
nRBC: 0 % (ref 0.0–0.2)

## 2023-03-24 LAB — BRAIN NATRIURETIC PEPTIDE: B Natriuretic Peptide: 356 pg/mL — ABNORMAL HIGH (ref 0.0–100.0)

## 2023-03-24 LAB — TROPONIN I (HIGH SENSITIVITY): Troponin I (High Sensitivity): 7 ng/L (ref <18)

## 2023-03-24 LAB — GLUCOSE, CAPILLARY: Glucose-Capillary: 289 mg/dL — ABNORMAL HIGH (ref 70–99)

## 2023-03-24 MED ORDER — INSULIN GLARGINE-YFGN 100 UNIT/ML ~~LOC~~ SOLN
20.0000 [IU] | Freq: Every day | SUBCUTANEOUS | Status: DC
  Administered 2023-03-24 – 2023-03-26 (×3): 20 [IU] via SUBCUTANEOUS
  Filled 2023-03-24 (×4): qty 0.2

## 2023-03-24 MED ORDER — METOPROLOL TARTRATE 50 MG PO TABS
100.0000 mg | ORAL_TABLET | Freq: Two times a day (BID) | ORAL | Status: DC
  Administered 2023-03-24 – 2023-03-27 (×6): 100 mg via ORAL
  Filled 2023-03-24 (×6): qty 2

## 2023-03-24 MED ORDER — GUAIFENESIN-DM 100-10 MG/5ML PO SYRP
5.0000 mL | ORAL_SOLUTION | ORAL | Status: DC | PRN
  Administered 2023-03-26 (×2): 5 mL via ORAL
  Filled 2023-03-24 (×3): qty 5

## 2023-03-24 MED ORDER — ONDANSETRON HCL 4 MG PO TABS: 4.0000 mg | ORAL_TABLET | Freq: Four times a day (QID) | ORAL | Status: DC | PRN

## 2023-03-24 MED ORDER — TIZANIDINE HCL 2 MG PO TABS: 2.0000 mg | ORAL_TABLET | Freq: Three times a day (TID) | ORAL | Status: DC | PRN

## 2023-03-24 MED ORDER — INSULIN ASPART 100 UNIT/ML IJ SOLN
0.0000 [IU] | Freq: Every day | INTRAMUSCULAR | Status: DC
  Administered 2023-03-24: 3 [IU] via SUBCUTANEOUS

## 2023-03-24 MED ORDER — ONDANSETRON HCL 4 MG/2ML IJ SOLN: 4.0000 mg | Freq: Four times a day (QID) | INTRAMUSCULAR | Status: DC | PRN

## 2023-03-24 MED ORDER — DEXAMETHASONE SODIUM PHOSPHATE 10 MG/ML IJ SOLN
6.0000 mg | INTRAMUSCULAR | Status: DC
  Administered 2023-03-24 – 2023-03-26 (×3): 6 mg via INTRAVENOUS
  Filled 2023-03-24 (×3): qty 1

## 2023-03-24 MED ORDER — METOPROLOL TARTRATE 50 MG PO TABS: 50.0000 mg | ORAL_TABLET | Freq: Two times a day (BID) | ORAL | Status: DC

## 2023-03-24 MED ORDER — GABAPENTIN 100 MG PO CAPS
100.0000 mg | ORAL_CAPSULE | Freq: Two times a day (BID) | ORAL | Status: DC
  Administered 2023-03-24 – 2023-03-27 (×6): 100 mg via ORAL
  Filled 2023-03-24 (×6): qty 1

## 2023-03-24 MED ORDER — ALBUTEROL SULFATE (2.5 MG/3ML) 0.083% IN NEBU
2.5000 mg | INHALATION_SOLUTION | RESPIRATORY_TRACT | Status: DC
  Administered 2023-03-24 – 2023-03-25 (×4): 2.5 mg via RESPIRATORY_TRACT
  Filled 2023-03-24 (×3): qty 3

## 2023-03-24 MED ORDER — ALBUTEROL (5 MG/ML) CONTINUOUS INHALATION SOLN
10.0000 mg | INHALATION_SOLUTION | RESPIRATORY_TRACT | Status: AC
  Filled 2023-03-24: qty 5

## 2023-03-24 MED ORDER — ALBUTEROL SULFATE (2.5 MG/3ML) 0.083% IN NEBU
INHALATION_SOLUTION | RESPIRATORY_TRACT | Status: AC
  Filled 2023-03-24: qty 12

## 2023-03-24 MED ORDER — ADULT MULTIVITAMIN W/MINERALS CH
1.0000 | ORAL_TABLET | Freq: Every day | ORAL | Status: DC
  Administered 2023-03-24 – 2023-03-27 (×4): 1 via ORAL
  Filled 2023-03-24 (×4): qty 1

## 2023-03-24 MED ORDER — INSULIN ASPART 100 UNIT/ML IJ SOLN
0.0000 [IU] | Freq: Three times a day (TID) | INTRAMUSCULAR | Status: DC
  Administered 2023-03-25: 11 [IU] via SUBCUTANEOUS
  Administered 2023-03-25: 7 [IU] via SUBCUTANEOUS
  Administered 2023-03-25: 3 [IU] via SUBCUTANEOUS
  Administered 2023-03-26: 4 [IU] via SUBCUTANEOUS
  Administered 2023-03-26: 3 [IU] via SUBCUTANEOUS
  Administered 2023-03-27: 11 [IU] via SUBCUTANEOUS
  Administered 2023-03-27: 7 [IU] via SUBCUTANEOUS

## 2023-03-24 MED ORDER — ENOXAPARIN SODIUM 80 MG/0.8ML IJ SOSY
0.5000 mg/kg | PREFILLED_SYRINGE | INTRAMUSCULAR | Status: DC
  Administered 2023-03-24: 65 mg via SUBCUTANEOUS
  Filled 2023-03-24: qty 0.8

## 2023-03-24 MED ORDER — HYDRALAZINE HCL 20 MG/ML IJ SOLN
10.0000 mg | INTRAMUSCULAR | Status: AC
  Administered 2023-03-24: 10 mg via INTRAVENOUS
  Filled 2023-03-24: qty 1

## 2023-03-24 NOTE — ED Notes (Signed)
Tested positive today at outside facility for covid.

## 2023-03-24 NOTE — ED Triage Notes (Signed)
Pt sent from [facility] for evaluation of SOB, tested Covid +

## 2023-03-24 NOTE — ED Notes (Signed)
ED TO INPATIENT HANDOFF REPORT  ED Nurse Name and Phone #: Johnney Killian Name/Age/Gender Tracey Turner 67 y.o. female Room/Bed: APA10/APA10  Code Status   Code Status: Full Code  Home/SNF/Other Home Patient oriented to: self, place, time, and situation Is this baseline? Yes   Triage Complete: Triage complete  Chief Complaint Acute respiratory failure with hypoxia (HCC) [J96.01]  Triage Note Pt sent from UC for evaluation of SOB, tested Covid +   Allergies Allergies  Allergen Reactions   Ace Inhibitors Shortness Of Breath, Swelling, Anaphylaxis and Other (See Comments)   Penicillins Anaphylaxis   Protonix [Pantoprazole] Diarrhea and Itching    Diarrhea; can take Prevacid without difficulty   Norvasc [Amlodipine] Other (See Comments)    Lower leg edema, worsening   Amoxicillin     Nausea/aggravates gastritis   Cholecalciferol Other (See Comments)    Other Reaction(s): Other (See Comments)   Empagliflozin Other (See Comments)    Joint pain   Nexium [Esomeprazole Magnesium] Diarrhea   Nsaids Other (See Comments)    Gastritis, sharp decrease in iron   Omeprazole Other (See Comments)    diarrhea   Prevacid [Lansoprazole]     diarrhea   Victoza [Liraglutide] Other (See Comments)    "out of it" while taking med    Level of Care/Admitting Diagnosis ED Disposition     ED Disposition  Admit   Condition  --   Comment  Hospital Area: [facility] [[id number]]  Level of Care: Telemetry [5]  Covid Evaluation: Confirmed COVID Positive  Diagnosis: Acute respiratory failure with hypoxia [facility]) [[id number]]  Admitting Physician: Levie Heritage [[id number]]  Attending Physician: Levie Heritage [[id number]]  Certification:: I certify this patient will need inpatient services for at least 2 midnights  Expected Medical Readiness: 03/27/2023          B Medical/Surgery History Past Medical History:  Diagnosis Date   Anemia    Arthritis    Back pain    Gastritis    Glaucoma     Hypertension    Knee pain    Type 2 diabetes mellitus (HCC)    Past Surgical History:  Procedure Laterality Date   COLONOSCOPY  02/13/2011   internal hemorrhoids   ESOPHAGOGASTRODUODENOSCOPY  02/13/2011   mild gatritis   GIVENS CAPSULE STUDY  03/28/2012       GIVENS CAPSULE STUDY  03/26/2012   Procedure: GIVENS CAPSULE STUDY;  Surgeon: West Bali, MD;  [facility];  Service: Endoscopy;  Laterality: N/A;  7:30   None       A IV Location/Drains/Wounds Patient Lines/Drains/Airways Status     Active Line/Drains/Airways     Name Placement date Placement time Site Days   Peripheral IV 03/24/23 20 G 1" Right Antecubital 03/24/23  1738  Antecubital  less than 1            Intake/Output Last 24 hours No intake or output data in the 24 hours ending 03/24/23 1912  Labs/Imaging Results for orders placed or performed during the hospital encounter of 03/24/23 (from the past 48 hour(s))  CBC with Differential     Status: Abnormal   Collection Time: 03/24/23  4:32 PM  Result Value Ref Range   WBC 9.0 4.0 - 10.5 K/uL   RBC 4.46 3.87 - 5.11 MIL/uL   Hemoglobin 12.3 12.0 - 15.0 g/dL   HCT 56.3 87.5 - 64.3 %   MCV 87.2 80.0 - 100.0 fL   MCH 27.6  26.0 - 34.0 pg   MCHC 31.6 30.0 - 36.0 g/dL   RDW 69.6 (H) 29.5 - 28.4 %   Platelets 262 150 - 400 K/uL   nRBC 0.0 0.0 - 0.2 %   Neutrophils Relative % 75 %   Neutro Abs 6.8 1.7 - 7.7 K/uL   Lymphocytes Relative 13 %   Lymphs Abs 1.1 0.7 - 4.0 K/uL   Monocytes Relative 10 %   Monocytes Absolute 0.9 0.1 - 1.0 K/uL   Eosinophils Relative 1 %   Eosinophils Absolute 0.1 0.0 - 0.5 K/uL   Basophils Relative 1 %   Basophils Absolute 0.1 0.0 - 0.1 K/uL   Immature Granulocytes 0 %   Abs Immature Granulocytes 0.03 0.00 - 0.07 K/uL    Comment: Performed at [facility], [address], [place], [place] [address]  Basic metabolic panel     Status: Abnormal   Collection Time: 03/24/23  4:32 PM  Result Value Ref Range   Sodium  136 135 - 145 mmol/L   Potassium 3.7 3.5 - 5.1 mmol/L   Chloride 99 98 - 111 mmol/L   CO2 29 22 - 32 mmol/L   Glucose, Bld 207 (H) 70 - 99 mg/dL    Comment: Glucose reference range applies only to samples taken after fasting for at least 8 hours.   BUN 9 8 - 23 mg/dL   Creatinine, Ser 0.10 0.44 - 1.00 mg/dL   Calcium 8.5 (L) 8.9 - 10.3 mg/dL   GFR, Estimated >27 >25 mL/min    Comment: (NOTE) Calculated using the CKD-EPI Creatinine Equation (2021)    Anion gap 8 5 - 15    Comment: Performed at [facility], [address], [place], [place] [address]  Troponin I (High Sensitivity)     Status: None   Collection Time: 03/24/23  5:27 PM  Result Value Ref Range   Troponin I (High Sensitivity) 7 <18 ng/L    Comment: (NOTE) Elevated high sensitivity troponin I (hsTnI) values and significant  changes across serial measurements may suggest ACS but many other  chronic and acute conditions are known to elevate hsTnI results.  Refer to the "Links" section for chest pain algorithms and additional  guidance. Performed at [facility], [address], [place], [place] [address]   Brain natriuretic peptide     Status: Abnormal   Collection Time: 03/24/23  5:31 PM  Result Value Ref Range   B Natriuretic Peptide 356.0 (H) 0.0 - 100.0 pg/mL    Comment: Performed at [facility], [address] [place], [place] [address]   DG Chest Portable 1 View  Result Date: 03/24/2023 CLINICAL DATA:  Shortness of breath.  COVID positive. EXAM: PORTABLE CHEST 1 VIEW COMPARISON:  Radiograph earlier FINDINGS: Eventration of right hemidiaphragm. Low lung volumes. Bronchovascular crowding versus vascular congestion. Borderline cardiomegaly is unchanged from earlier today. No pneumothorax or large pleural effusion. IMPRESSION: 1. Low lung volumes with bronchovascular crowding versus vascular congestion. 2. Borderline cardiomegaly, unchanged from earlier today. Electronically Signed   By: Narda Rutherford M.D.   On:  03/24/2023 18:00    Pending Labs [employer] (From admission, onward)     Start     Ordered   Signed and Held  HIV Antibody (routine testing w rflx)  (HIV Antibody (Routine testing w reflex) panel)  Once,   R        Signed and Held   Signed and Held  Basic metabolic panel  Tomorrow morning,   R  Signed and Held   Signed and Held  CBC  Tomorrow morning,   R        Signed and Held            Vitals/Pain Today's Vitals   03/24/23 1721 03/24/23 1722 03/24/23 1738 03/24/23 1745  BP:   (!) 180/88 (!) 179/64  Pulse:    87  Resp: (!) 29 (!) 32  20  Temp:      SpO2:    99%  Weight:      Height:      PainSc:        Isolation Precautions Airborne and Contact precautions  Medications Medications  albuterol (PROVENTIL,VENTOLIN) solution continuous neb (10 mg Nebulization Not Given 03/24/23 1749)  dexamethasone (DECADRON) injection 6 mg (has no administration in time range)  albuterol (PROVENTIL) (2.5 MG/3ML) 0.083% nebulizer solution (  Not Given 03/24/23 1758)  metoprolol tartrate (LOPRESSOR) tablet 100 mg (has no administration in time range)  hydrALAZINE (APRESOLINE) injection 10 mg (10 mg Intravenous Given 03/24/23 1738)    Mobility walks     Focused Assessments Pulmonary Assessment Handoff:  Lung sounds: Bilateral Breath Sounds: Expiratory wheezes, Inspiratory wheezes, Diminished O2 Device: Room Air O2 Flow Rate (L/min): 2 L/min    R Recommendations: See Admitting Provider Note  Report given to:   Additional Notes: wearing 2LNC +Covid

## 2023-03-24 NOTE — Plan of Care (Signed)
  Problem: Education: Goal: Ability to describe self-care measures that may prevent or decrease complications (Diabetes Survival Skills Education) will improve Outcome: Progressing   Problem: Coping: Goal: Ability to adjust to condition or change in health will improve Outcome: Progressing   Problem: Health Behavior/Discharge Planning: Goal: Ability to identify and utilize available resources and services will improve Outcome: Progressing   

## 2023-03-24 NOTE — H&P (Signed)
History and Physical    Patient: Tracey Turner:[id number] DOB: 29-Nov-1955 DOA: 03/24/2023 DOS: the patient was seen and examined on 03/24/2023 PCP: Tommie Sams, DO  Patient coming from: Home  Chief Complaint:  Chief Complaint  Patient presents with   Shortness of Breath   HPI: Tracey Turner is a 67 y.o. female with medical history significant of uncontrolled diabetes with hyperglycemia, hypertension.  She presents with 4 days of worsening shortness of breath.  Having a difficult time ambulating due to the shortness of breath.  She presented to urgent care and was diagnosed with COVID.  They did a chest x-ray and told her to come to the hospital for evaluation.  Here, she was noted to have an oxygen saturation of 88% on room air.  She responded to oxygen via nasal cannula.  Additionally, her blood pressure was 213/59.  She was given IV hydralazine with good effect.  Review of Systems: As mentioned in the history of present illness. All other systems reviewed and are negative. Past Medical History:  Diagnosis Date   Anemia    Arthritis    Back pain    Gastritis    Glaucoma    Hypertension    Knee pain    Type 2 diabetes mellitus (HCC)    Past Surgical History:  Procedure Laterality Date   COLONOSCOPY  02/13/2011   internal hemorrhoids   ESOPHAGOGASTRODUODENOSCOPY  02/13/2011   mild gatritis   GIVENS CAPSULE STUDY  03/28/2012       GIVENS CAPSULE STUDY  03/26/2012   Procedure: GIVENS CAPSULE STUDY;  Surgeon: West Bali, MD;  Location[facility];  Service: Endoscopy;  Laterality: N/A;  7:30   None     Social History:  reports that she quit smoking about 15 years ago. Her smoking use included cigarettes. She started smoking about 52 years ago. She has a 36.9 pack-year smoking history. She has never used smokeless tobacco. She reports that she does not drink alcohol and does not use drugs.  Allergies  Allergen Reactions   Ace Inhibitors Shortness Of Breath, Swelling,  Anaphylaxis and Other (See Comments)   Penicillins Anaphylaxis   Protonix [Pantoprazole] Diarrhea and Itching    Diarrhea; can take Prevacid without difficulty   Norvasc [Amlodipine] Other (See Comments)    Lower leg edema, worsening   Amoxicillin     Nausea/aggravates gastritis   Cholecalciferol Other (See Comments)    Other Reaction(s): Other (See Comments)   Empagliflozin Other (See Comments)    Joint pain   Nexium [Esomeprazole Magnesium] Diarrhea   Nsaids Other (See Comments)    Gastritis, sharp decrease in iron   Omeprazole Other (See Comments)    diarrhea   Prevacid [Lansoprazole]     diarrhea   Victoza [Liraglutide] Other (See Comments)    "out of it" while taking med    Family History  Problem Relation Age of Onset   Prostate cancer Father    Stroke Father    Hypertension Father    Diabetes Mother    Heart disease Mother    Hypertension Mother    Heart disease Sister    Asthma Sister    Kidney disease Brother    Diabetes Brother    Hypertension Brother    Heart disease Brother    Diabetes Brother    Heart disease Brother    Diabetes Brother    Heart disease Brother    Heart disease Unknown    Arthritis Unknown  Cancer Unknown    Diabetes Unknown    Kidney disease Unknown    Colon cancer Neg Hx    Colon polyps Neg Hx     Prior to Admission medications   Medication Sig Start Date End Date Taking? Authorizing Provider  promethazine-dextromethorphan (PROMETHAZINE-DM) 6.25-15 MG/5ML syrup Take 5 mLs by mouth 4 (four) times daily as needed. 10/23/22  Yes [provider]  acetaminophen (TYLENOL) 650 MG CR tablet Take 650 mg by mouth every 8 (eight) hours as needed. arthritis    [provider]  albuterol (VENTOLIN HFA) 108 (90 Base) MCG/ACT inhaler INHALE TWO PUFFS EVERY 6 HOURS AS NEEDED FOR WHEEZING OR SHORTNESS OF BREATH 02/12/23   Campbell Riches, NP  benzonatate (TESSALON) 100 MG capsule Take 100 mg by mouth every 6 (six) hours as  needed for cough. 03/24/23 03/23/24  [provider]  Calcium Carbonate-Vitamin D (CALCIUM + D PO) Take by mouth daily.     [provider]  Calcium-Magnesium-Vitamin D (CITRACAL CALCIUM+D) 600-40-500 MG-MG-UNIT TB24 Citracal Calcium+D    [provider]  cetirizine (ZYRTEC) 10 MG tablet Take 10 mg by mouth daily.    [provider]  cyclobenzaprine (FLEXERIL) 10 MG tablet Take 1 tablet (10 mg total) by mouth 3 (three) times daily as needed for muscle spasms. Muscle spasms 08/04/22   Everlene Other G, DO  ferrous sulfate dried (SLOW FE) 160 (50 FE) MG TBCR Take 160 mg by mouth 3 (three) times daily with meals.    [provider]  ferrous sulfate ER (SLOW FE) 142 (45 Fe) MG TBCR tablet Take 137 mg by mouth daily.    [provider]  fluticasone (FLONASE) 50 MCG/ACT nasal spray Place 2 sprays into both nostrils daily. 12/15/22   Tommie Sams, DO  furosemide (LASIX) 20 MG tablet TAKE 1 TABLET BY MOUTH ONCE DAILY AS NEEDED FOR EDEMA 12/15/22   Tommie Sams, DO  gabapentin (NEURONTIN) 100 MG capsule Take 1 capsule (100 mg total) by mouth 2 (two) times daily. 12/15/22   Tommie Sams, DO  glipiZIDE (GLUCOTROL) 10 MG tablet Take 1 tablet (10 mg total) by mouth 2 (two) times daily before a meal. 12/15/22   Cook, Dorie Rank G, DO  hydroquinone 4 % cream Apply 1 Application topically 2 (two) times daily as needed (dark patches).    [provider]  latanoprost (XALATAN) 0.005 % ophthalmic solution INSTILL 1 DROP IN [facility] EYE  AT BEDTIME 09/28/17   Sherie Don C, NP  lidocaine (LIDODERM) 5 % APPLY 1 PATCH ONCE A DAY FOR 12 HOURS ON AND 12 HOURS OFF 03/22/23   Tommie Sams, DO  metFORMIN (GLUCOPHAGE XR) 750 MG 24 hr tablet Take one tablet po daily with food 12/15/22   Everlene Other G, DO  metoprolol tartrate (LOPRESSOR) 50 MG tablet Take 1 tablet (50 mg total) by mouth 2 (two) times daily. 12/15/22   Tommie Sams, DO  Multiple Vitamin (MULTIVITAMIN WITH MINERALS)  TABS Take 1 tablet by mouth daily.    [provider]  tiZANidine (ZANAFLEX) 2 MG tablet Take 2 mg by mouth every 8 (eight) hours as needed for muscle spasms. 11/28/22   [provider]  traMADol (ULTRAM) 50 MG tablet Take 1 tablet (50 mg total) by mouth every 12 (twelve) hours as needed for moderate pain or severe pain. 12/15/22   Tommie Sams, DO  triamterene-hydrochlorothiazide (DYAZIDE) 37.5-25 MG capsule Take 1 each (1 capsule total) by mouth daily.  12/15/22   Tommie Sams, DO    Physical Exam: Vitals:   03/24/23 1721 03/24/23 1722 03/24/23 1738 03/24/23 1745  BP:   (!) 180/88 (!) 179/64  Pulse:    87  Resp: (!) 29 (!) 32  20  Temp:      SpO2:    99%  Weight:      Height:       General: Elderly female. Awake and alert and oriented x3. No acute cardiopulmonary distress.  HEENT: Normocephalic atraumatic.  Right and left ears normal in appearance.  Pupils equal, round, reactive to light. Extraocular muscles are intact. Sclerae anicteric and noninjected.  Moist mucosal membranes. No mucosal lesions.  Neck: Neck supple without lymphadenopathy. No carotid bruits. No masses palpated.  Cardiovascular: Regular rate with normal S1-S2 sounds. No murmurs, rubs, gallops auscultated. No JVD.  Respiratory: Rales bilaterally.  No accessory muscle use. Abdomen: Soft, nontender, nondistended. Active bowel sounds. No masses or hepatosplenomegaly  Skin: No rashes, lesions, or ulcerations.  Dry, warm to touch. 2+ dorsalis pedis and radial pulses. Musculoskeletal: No calf or leg pain. All major joints not erythematous nontender.  No upper or lower joint deformation.  Good ROM.  No contractures  Psychiatric: Intact judgment and insight. Pleasant and cooperative. Neurologic: No focal neurological deficits. Strength is 5/5 and symmetric in upper and lower extremities.  Cranial nerves II through XII are grossly intact.  Data Reviewed: Results for orders placed or performed during the  hospital encounter of 03/24/23 (from the past 24 hour(s))  CBC with Differential     Status: Abnormal   Collection Time: 03/24/23  4:32 PM  Result Value Ref Range   WBC 9.0 4.0 - 10.5 K/uL   RBC 4.46 3.87 - 5.11 MIL/uL   Hemoglobin 12.3 12.0 - 15.0 g/dL   HCT 08.6 57.8 - 46.9 %   MCV 87.2 80.0 - 100.0 fL   MCH 27.6 26.0 - 34.0 pg   MCHC 31.6 30.0 - 36.0 g/dL   RDW 62.9 (H) 52.8 - 41.3 %   Platelets 262 150 - 400 K/uL   nRBC 0.0 0.0 - 0.2 %   Neutrophils Relative % 75 %   Neutro Abs 6.8 1.7 - 7.7 K/uL   Lymphocytes Relative 13 %   Lymphs Abs 1.1 0.7 - 4.0 K/uL   Monocytes Relative 10 %   Monocytes Absolute 0.9 0.1 - 1.0 K/uL   Eosinophils Relative 1 %   Eosinophils Absolute 0.1 0.0 - 0.5 K/uL   Basophils Relative 1 %   Basophils Absolute 0.1 0.0 - 0.1 K/uL   Immature Granulocytes 0 %   Abs Immature Granulocytes 0.03 0.00 - 0.07 K/uL  Basic metabolic panel     Status: Abnormal   Collection Time: 03/24/23  4:32 PM  Result Value Ref Range   Sodium 136 135 - 145 mmol/L   Potassium 3.7 3.5 - 5.1 mmol/L   Chloride 99 98 - 111 mmol/L   CO2 29 22 - 32 mmol/L   Glucose, Bld 207 (H) 70 - 99 mg/dL   BUN 9 8 - 23 mg/dL   Creatinine, Ser 2.44 0.44 - 1.00 mg/dL   Calcium 8.5 (L) 8.9 - 10.3 mg/dL   GFR, Estimated >01 >02 mL/min   Anion gap 8 5 - 15  Troponin I (High Sensitivity)     Status: None   Collection Time: 03/24/23  5:27 PM  Result Value Ref Range   Troponin I (High Sensitivity) 7 <18 ng/L  Brain natriuretic  peptide     Status: Abnormal   Collection Time: 03/24/23  5:31 PM  Result Value Ref Range   B Natriuretic Peptide 356.0 (H) 0.0 - 100.0 pg/mL    DG Chest Portable 1 View  Result Date: 03/24/2023 CLINICAL DATA:  Shortness of breath.  COVID positive. EXAM: PORTABLE CHEST 1 VIEW COMPARISON:  Radiograph earlier FINDINGS: Eventration of right hemidiaphragm. Low lung volumes. Bronchovascular crowding versus vascular congestion. Borderline cardiomegaly is unchanged from  earlier today. No pneumothorax or large pleural effusion. IMPRESSION: 1. Low lung volumes with bronchovascular crowding versus vascular congestion. 2. Borderline cardiomegaly, unchanged from earlier today. Electronically Signed   By: Narda Rutherford M.D.   On: 03/24/2023 18:00      Assessment and Plan: No notes have been filed under this hospital service. Service: Hospitalist  Principal Problem:   Acute respiratory failure with hypoxia (HCC) Active Problems:   Uncontrolled type 2 diabetes mellitus with hyperglycemia (HCC)   Essential hypertension   Morbid obesity (HCC)   Pneumonia due to COVID-19 virus   Hypertensive urgency  Acute respiratory failure with hypoxia, pneumonia due to COVID-19 Daily labs: CBC, CMP, Discussed use of steroids: Solu-Medrol.  Patient is outside the window for Paxlovid and remdesivir Supplemental oxygen Albuterol HFA as needed Antitussives Chronic hypertension with hypertensive urgency Increase metoprolol to 100 mg and give her a dose oral medication now Uncontrolled type 2 diabetes with hyperglycemia Lantus 20 units subcu, sliding scale insulin Morbid obesity\   Advance Care Planning:   Code Status: Full Code  Consults:   Family Communication:   Severity of Illness: The appropriate patient status for this patient is INPATIENT. Inpatient status is judged to be reasonable and necessary in order to provide the required intensity of service to ensure the patient's safety. The patient's presenting symptoms, physical exam findings, and initial radiographic and laboratory data in the context of their chronic comorbidities is felt to place them at high risk for further clinical deterioration. Furthermore, it is not anticipated that the patient will be medically stable for discharge from the hospital within 2 midnights of admission.   * I certify that at the point of admission it is my clinical judgment that the patient will require inpatient hospital care  spanning beyond 2 midnights from the point of admission due to high intensity of service, high risk for further deterioration and high frequency of surveillance required.*  Author: Levie Heritage, DO 03/24/2023 6:08 PM  For on call review [email or web address].

## 2023-03-24 NOTE — ED Provider Notes (Signed)
[place] [facility] AT [facility] Provider Note   CSN: [id number] Arrival date & time: 03/24/23  1556     History  Chief Complaint  Patient presents with   Shortness of Breath    Tracey Turner is a 67 y.o. female.   Shortness of Breath  67 year old female, history of diabetes not optimally controlled, history of hypertension, and also a history of some type of reactive airway disease as she reports that she is on an albuterol inhaler which she used as needed.  She comes in today because she has had about 4 days of sore throat coughing and a subjective fever, she had gone to an urgent care today where she was diagnosed with COVID-19, she was told to come to the ER for some reason.  She is not sure why.  She reports that she was not given any medications at discharge though I do not have those records to confirm.    Home Medications Prior to Admission medications   Medication Sig Start Date End Date Taking? Authorizing Provider  promethazine-dextromethorphan (PROMETHAZINE-DM) 6.25-15 MG/5ML syrup Take 5 mLs by mouth 4 (four) times daily as needed. 10/23/22  Yes [provider]  acetaminophen (TYLENOL) 650 MG CR tablet Take 650 mg by mouth every 8 (eight) hours as needed. arthritis    [provider]  albuterol (VENTOLIN HFA) 108 (90 Base) MCG/ACT inhaler INHALE TWO PUFFS EVERY 6 HOURS AS NEEDED FOR WHEEZING OR SHORTNESS OF BREATH 02/12/23   Campbell Riches, NP  azithromycin (ZITHROMAX) 250 MG tablet Take 250 mg by mouth daily. 10/19/22   [provider]  benzonatate (TESSALON) 100 MG capsule Take 100 mg by mouth every 6 (six) hours as needed for cough. 03/24/23 03/23/24  [provider]  Calcium Carbonate-Vitamin D (CALCIUM + D PO) Take by mouth daily.     [provider]  Calcium-Magnesium-Vitamin D (CITRACAL CALCIUM+D) 600-40-500 MG-MG-UNIT TB24 Citracal Calcium+D    [provider]  cetirizine (ZYRTEC) 10 MG  tablet Take 10 mg by mouth daily.    [provider]  cyclobenzaprine (FLEXERIL) 10 MG tablet Take 1 tablet (10 mg total) by mouth 3 (three) times daily as needed for muscle spasms. Muscle spasms 08/04/22   Tommie Sams, DO  empagliflozin (JARDIANCE) 10 MG TABS tablet Take 1 tablet (10 mg total) by mouth daily. Patient not taking: Reported on 02/16/2023 12/17/22   Tommie Sams, DO  ferrous sulfate dried (SLOW FE) 160 (50 FE) MG TBCR Take 160 mg by mouth 3 (three) times daily with meals.    [provider]  ferrous sulfate ER (SLOW FE) 142 (45 Fe) MG TBCR tablet Take 137 mg by mouth daily.    [provider]  fluticasone (FLONASE) 50 MCG/ACT nasal spray Place 2 sprays into both nostrils daily. 12/15/22   Tommie Sams, DO  furosemide (LASIX) 20 MG tablet TAKE 1 TABLET BY MOUTH ONCE DAILY AS NEEDED FOR EDEMA 12/15/22   Tommie Sams, DO  gabapentin (NEURONTIN) 100 MG capsule Take 1 capsule (100 mg total) by mouth 2 (two) times daily. 12/15/22   Tommie Sams, DO  glipiZIDE (GLUCOTROL) 10 MG tablet Take 1 tablet (10 mg total) by mouth 2 (two) times daily before a meal. 12/15/22   Cook, Dorie Rank G, DO  hydroquinone 4 % cream Apply 1 Application topically 2 (two) times daily as needed (dark patches).    [provider]  latanoprost (XALATAN) 0.005 % ophthalmic solution  INSTILL 1 DROP IN [facility] EYE  AT BEDTIME 09/28/17   Sherie Don C, NP  lidocaine (LIDODERM) 5 % APPLY 1 PATCH ONCE A DAY FOR 12 HOURS ON AND 12 HOURS OFF 03/22/23   Tommie Sams, DO  metFORMIN (GLUCOPHAGE XR) 750 MG 24 hr tablet Take one tablet po daily with food 12/15/22   Everlene Other G, DO  metoprolol tartrate (LOPRESSOR) 50 MG tablet Take 1 tablet (50 mg total) by mouth 2 (two) times daily. 12/15/22   Tommie Sams, DO  Multiple Vitamin (MULTIVITAMIN WITH MINERALS) TABS Take 1 tablet by mouth daily.    [provider]  predniSONE (STERAPRED UNI-PAK 21 TAB) 5 MG (21) TBPK tablet Take 5 mg by mouth as  directed. 11/28/22   [provider]  tiZANidine (ZANAFLEX) 2 MG tablet Take 2 mg by mouth every 8 (eight) hours as needed for muscle spasms. 11/28/22   [provider]  traMADol (ULTRAM) 50 MG tablet Take 1 tablet (50 mg total) by mouth every 12 (twelve) hours as needed for moderate pain or severe pain. 12/15/22   Tommie Sams, DO  triamterene-hydrochlorothiazide (DYAZIDE) 37.5-25 MG capsule Take 1 each (1 capsule total) by mouth daily. 12/15/22   Tommie Sams, DO      Allergies    Ace inhibitors, Penicillins, Protonix [pantoprazole], Norvasc [amlodipine], Amoxicillin, Cholecalciferol, Empagliflozin, Nexium [esomeprazole magnesium], Nsaids, Omeprazole, Prevacid [lansoprazole], and Victoza [liraglutide]    Review of Systems   Review of Systems  Respiratory:  Positive for shortness of breath.   All other systems reviewed and are negative.   Physical Exam Updated Vital Signs BP (!) 180/88   Pulse 96   Temp 99.1 F (37.3 C)   Resp (!) 32   Ht 1.676 m (5\' 6" )   Wt 136.1 kg   SpO2 97%   BMI 48.42 kg/m  Physical Exam Vitals and nursing note reviewed.  Constitutional:      General: She is not in acute distress.    Appearance: She is well-developed.  HENT:     Head: Normocephalic and atraumatic.     Mouth/Throat:     Pharynx: No oropharyngeal exudate.  Eyes:     General: No scleral icterus.       Right eye: No discharge.        Left eye: No discharge.     Conjunctiva/sclera: Conjunctivae normal.     Pupils: Pupils are equal, round, and reactive to light.  Neck:     Thyroid: No thyromegaly.     Vascular: No JVD.  Cardiovascular:     Rate and Rhythm: Normal rate and regular rhythm.     Heart sounds: Normal heart sounds. No murmur heard.    No friction rub. No gallop.  Pulmonary:     Effort: Pulmonary effort is normal. No respiratory distress.     Breath sounds: Rales present. No wheezing.     Comments: Subtle rales at the bases bilaterally but speaks in full  sentences with no increased work of breathing Abdominal:     General: Bowel sounds are normal. There is no distension.     Palpations: Abdomen is soft. There is no mass.     Tenderness: There is no abdominal tenderness.  Musculoskeletal:        General: No tenderness. Normal range of motion.     Cervical back: Normal range of motion and neck supple.     Right lower leg: No edema.     Left lower  leg: No edema.  Lymphadenopathy:     Cervical: No cervical adenopathy.  Skin:    General: Skin is warm and dry.     Findings: No erythema or rash.  Neurological:     Mental Status: She is alert.     Coordination: Coordination normal.  Psychiatric:        Behavior: Behavior normal.     ED Results / Procedures / Treatments   Labs (all labs ordered are listed, but only abnormal results are displayed) Labs Reviewed  CBC WITH DIFFERENTIAL/PLATELET - Abnormal; Notable for the following components:      Result Value   RDW 16.3 (*)    All other components within normal limits  BASIC METABOLIC PANEL - Abnormal; Notable for the following components:   Glucose, Bld 207 (*)    Calcium 8.5 (*)    All other components within normal limits  BRAIN NATRIURETIC PEPTIDE  TROPONIN I (HIGH SENSITIVITY)    EKG EKG Interpretation Date/Time:  Saturday March 24 2023 16:36:56 EDT Ventricular Rate:  101 PR Interval:  178 QRS Duration:  88 QT Interval:  352 QTC Calculation: 457 R Axis:   -31  Text Interpretation: Sinus tachycardia Atrial premature complexes Left axis deviation Consider anterior infarct Confirmed by Eber Hong ([id number]) on 03/24/2023 4:52:51 PM  Radiology No results found.  Procedures .Critical Care  Performed by: Eber Hong, MD Authorized by: Eber Hong, MD   Critical care provider statement:    Critical care time (minutes):  45   Critical care time was exclusive of:  Separately billable procedures and treating other patients and teaching time   Critical care was  necessary to treat or prevent imminent or life-threatening deterioration of the following conditions:  Respiratory failure   Critical care was time spent personally by me on the following activities:  Development of treatment plan with patient or surrogate, discussions with consultants, evaluation of patient's response to treatment, examination of patient, obtaining history from patient or surrogate, review of old charts, re-evaluation of patient's condition, pulse oximetry, ordering and review of radiographic studies, ordering and review of laboratory studies and ordering and performing treatments and interventions   I assumed direction of critical care for this patient from another provider in my specialty: no     Care discussed with: admitting provider   Comments:           Medications Ordered in ED Medications  albuterol (PROVENTIL,VENTOLIN) solution continuous neb (has no administration in time range)  dexamethasone (DECADRON) injection 6 mg (has no administration in time range)  hydrALAZINE (APRESOLINE) injection 10 mg (10 mg Intravenous Given 03/24/23 1738)    ED Course/ Medical Decision Making/ A&P                                 Medical Decision Making Amount and/or Complexity of Data Reviewed Labs: ordered. Radiology: ordered.  Risk Prescription drug management. Decision regarding hospitalization.    This patient presents to the ED for concern of shortness of breath and coughing and sore throat, this involves an extensive number of treatment options, and is a complaint that carries with it a high risk of complications and morbidity.  The differential diagnosis includes COVID-19, potential pneumonia complicating it, she has no other cardiac symptoms, she is severely hypertensive at 196/71   Co morbidities that complicate the patient evaluation  Diabetes and hypertension   Additional history obtained:  Additional history obtained from  medical record including the  office visits from Dr. Adriana Simas External records from outside source obtained and reviewed including office visits   Lab Tests:  I Ordered, and personally interpreted labs.  The pertinent results include: No leukocytosis, normal renal function   Imaging Studies ordered:  I ordered imaging studies including chest x-ray I independently visualized and interpreted imaging which showed bilateral interstitial edema and/or infiltrate I agree with the radiologist interpretation   Cardiac Monitoring: / EKG:  The patient was maintained on a cardiac monitor.  I personally viewed and interpreted the cardiac monitored which showed an underlying rhythm of: Borderline tachycardia   Consultations Obtained:  I requested consultation with the hospitalist Dr. Adrian Blackwater,  and discussed lab and imaging findings as well as pertinent plan - they recommend: Admission to the hospital   Problem List / ED Course / Critical interventions / Medication management  Acute hypoxic respiratory failure likely secondary to COVID-19, underlying poorly controlled diabetes and hypertension, initially presented with blood pressure of 200s over 60, respiratory rate of 32 and oxygen of 88% I ordered medication including albuterol, hydralazine for respiratory failure, hypoxia and severe hypertension Reevaluation of the patient after these medicines showed that the patient improved but persistently hypoxic I have reviewed the patients home medicines and have made adjustments as needed   Social Determinants of Health:  Obese, poorly controlled diabetic, noncompliant with medications at the patient on admission   Test / Admission - Considered:  Admit for hypoxic respiratory failure, patient critically ill         Final Clinical Impression(s) / ED Diagnoses Final diagnoses:  Acute hypoxic respiratory failure (HCC)  COVID-19  Uncontrolled hypertension    Rx / DC Orders ED Discharge Orders     None          Eber Hong, MD 03/24/23 [phone]

## 2023-03-25 ENCOUNTER — Encounter (HOSPITAL_COMMUNITY): Payer: Self-pay | Admitting: Family Medicine

## 2023-03-25 ENCOUNTER — Inpatient Hospital Stay (HOSPITAL_COMMUNITY): Payer: Medicare Other

## 2023-03-25 DIAGNOSIS — J9601 Acute respiratory failure with hypoxia: Secondary | ICD-10-CM | POA: Diagnosis not present

## 2023-03-25 LAB — GLUCOSE, CAPILLARY
Glucose-Capillary: 228 mg/dL — ABNORMAL HIGH (ref 70–99)
Glucose-Capillary: 261 mg/dL — ABNORMAL HIGH (ref 70–99)
Glucose-Capillary: 130 mg/dL — ABNORMAL HIGH (ref 70–99)
Glucose-Capillary: 177 mg/dL — ABNORMAL HIGH (ref 70–99)

## 2023-03-25 LAB — BASIC METABOLIC PANEL WITH GFR
Anion gap: 10 (ref 5–15)
BUN: 12 mg/dL (ref 8–23)
CO2: 27 mmol/L (ref 22–32)
Calcium: 8.4 mg/dL — ABNORMAL LOW (ref 8.9–10.3)
Chloride: 98 mmol/L (ref 98–111)
Creatinine, Ser: 0.68 mg/dL (ref 0.44–1.00)
GFR, Estimated: >60 mL/min
Glucose, Bld: 256 mg/dL — ABNORMAL HIGH (ref 70–99)
Potassium: 4 mmol/L (ref 3.5–5.1)
Sodium: 135 mmol/L (ref 135–145)

## 2023-03-25 LAB — CBC
HCT: 40.5 % (ref 36.0–46.0)
Hemoglobin: 12.4 g/dL (ref 12.0–15.0)
MCH: 26.9 pg (ref 26.0–34.0)
MCHC: 30.6 g/dL (ref 30.0–36.0)
MCV: 87.9 fL (ref 80.0–100.0)
Platelets: 254 10*3/uL (ref 150–400)
RBC: 4.61 MIL/uL (ref 3.87–5.11)
RDW: 16.2 % — ABNORMAL HIGH (ref 11.5–15.5)
WBC: 7.2 10*3/uL (ref 4.0–10.5)
nRBC: 0 % (ref 0.0–0.2)

## 2023-03-25 LAB — HIV ANTIBODY (ROUTINE TESTING W REFLEX): HIV Screen 4th Generation wRfx: NONREACTIVE

## 2023-03-25 LAB — C-REACTIVE PROTEIN: CRP: 5.2 mg/dL — ABNORMAL HIGH

## 2023-03-25 MED ORDER — LIDOCAINE 5 % EX PTCH
1.0000 | MEDICATED_PATCH | CUTANEOUS | Status: DC
  Administered 2023-03-25 – 2023-03-27 (×3): 1 via TRANSDERMAL
  Filled 2023-03-25 (×3): qty 1

## 2023-03-25 MED ORDER — ALBUTEROL SULFATE (2.5 MG/3ML) 0.083% IN NEBU
2.5000 mg | INHALATION_SOLUTION | Freq: Three times a day (TID) | RESPIRATORY_TRACT | Status: DC
  Administered 2023-03-25 – 2023-03-27 (×7): 2.5 mg via RESPIRATORY_TRACT
  Filled 2023-03-25 (×7): qty 3

## 2023-03-25 MED ORDER — ENOXAPARIN SODIUM 60 MG/0.6ML IJ SOSY
60.0000 mg | PREFILLED_SYRINGE | INTRAMUSCULAR | Status: DC
  Administered 2023-03-25 – 2023-03-26 (×2): 60 mg via SUBCUTANEOUS
  Filled 2023-03-25 (×2): qty 0.6

## 2023-03-25 MED ORDER — FLUTICASONE PROPIONATE 50 MCG/ACT NA SUSP
2.0000 | Freq: Every day | NASAL | Status: DC
  Administered 2023-03-25 – 2023-03-27 (×3): 2 via NASAL
  Filled 2023-03-25: qty 16

## 2023-03-25 MED ORDER — LORATADINE 10 MG PO TABS
10.0000 mg | ORAL_TABLET | Freq: Every day | ORAL | Status: DC
  Administered 2023-03-25 – 2023-03-27 (×3): 10 mg via ORAL
  Filled 2023-03-25 (×3): qty 1

## 2023-03-25 MED ORDER — HYDROQUINONE 4 % EX CREA: 1.0000 | TOPICAL_CREAM | Freq: Two times a day (BID) | CUTANEOUS | Status: DC | PRN

## 2023-03-25 MED ORDER — TRIAMTERENE-HCTZ 37.5-25 MG PO CAPS
1.0000 | ORAL_CAPSULE | Freq: Every day | ORAL | Status: DC
  Administered 2023-03-26: 1 via ORAL
  Filled 2023-03-25 (×6): qty 1

## 2023-03-25 MED ORDER — FERROUS SULFATE 325 (65 FE) MG PO TABS
325.0000 mg | ORAL_TABLET | Freq: Two times a day (BID) | ORAL | Status: DC
  Administered 2023-03-25 – 2023-03-27 (×5): 325 mg via ORAL
  Filled 2023-03-25 (×5): qty 1

## 2023-03-25 MED ORDER — TRAMADOL HCL 50 MG PO TABS: 50.0000 mg | ORAL_TABLET | Freq: Two times a day (BID) | ORAL | Status: DC | PRN

## 2023-03-25 MED ORDER — LATANOPROST 0.005 % OP SOLN
1.0000 [drp] | Freq: Every day | OPHTHALMIC | Status: DC
  Administered 2023-03-25 – 2023-03-26 (×2): 1 [drp] via OPHTHALMIC
  Filled 2023-03-25 (×2): qty 2.5

## 2023-03-25 NOTE — Progress Notes (Signed)
2D echo attempted, patient in chair. Patient said I should come back later as she does not like the way the bed makes her feel.

## 2023-03-25 NOTE — Progress Notes (Signed)
Patient is having SOB on exertion. On 2L [place] sitting on reclining  chair. She is having urinary incontinent dribbling urine on the floor while attempting to make it to the bathroom. Pure wick placed,

## 2023-03-25 NOTE — Progress Notes (Signed)
PROGRESS NOTE IVADELLE KORP  ONG:[id number] DOB: Oct 21, 1955 DOA: 03/24/2023 PCP: Tommie Sams, DO  Brief Narrative/Hospital Course: 67 y.o.f w/ history significant for uncontrolled diabetes with hyperglycemia, hypertension, arthritis/knee pain/back pain, anemia who presented with 4 days of worsening shortness of breath, having a difficult time ambulating due to the shortness of breath.  She presented to urgent care and was diagnosed with COVID. Cxr was done and told to come to the hospital for evaluation. In the ED: Initially hypoxic 88% on room air-air needing 2 L of nasal cannula saturating in mid to upper 90s, hypertensive up to 213/59 needing IV hydralazine with good effect.  Labs showed a stable BMP with blood sugar 207, BNP slightly of 356 troponin 7, normal CBC.  Chest x-ray low lung volumes with bronchovascular crowding versus vascular congestion borderline cardiomegaly. Patient was admitted for further management.    Subjective: Patient seen examined this morning She was resting comfortably in the bed reports feels better today off oxygen Has cough intermittently, feels weak.   Assessment and Plan: Principal Problem:   Acute respiratory failure with hypoxia (HCC) Active Problems:   Uncontrolled type 2 diabetes mellitus with hyperglycemia (HCC)   Essential hypertension   Morbid obesity (HCC)   Pneumonia due to COVID-19 virus   Hypertensive urgency   Acute hypoxic respiratory failure COVID-19 virus infection: Patient presented with shortness of breath hypoxia due to COVID-19 infection.  Symptom onset 4 days PTA. Chest x-ray without pneumonia, on admission felt to be outside of window for remdesivir/Paxlovid. Started on steroid, bronchodilators antitussives Currently she feels much better off oxygen.Continue supportive care with supplemental oxygen incentive spirometry and pulmonary hygiene.Will obtain inflammatory markers.  Hypertension with uncontrolled BP on admission: Blood  pressure currently stabilized after increasing metoprolol, continue Dyazide.  She takes as needed Lasix check echocardiogram given elevated BNP  Type 2 diabetes mellitus with uncontrolled hyperglycemia: PTA on Glucotrol, metformin.  Now on steroids and also placed on 20 units Semglee bedtime and SSI, monitor and adjust  Morbid obesity with BMI 46: She will benefit with weight loss, PCP follow-up and healthy lifestyle.  Advised to do sleep apnea study as outpatient  Arthritis/knee pain/back pain: Will resume her muscle laxer Neurontin, lidocaine and pain management  DVT prophylaxis: SCDs Start: 03/24/23 2005 Code Status:   Code Status: Full Code Family Communication: plan of care discussed with patient at bedside. Patient status is: Inpatient because of COVID infection Level of care: Telemetry   Dispo: The patient is from: home            Anticipated disposition: home 1-2 daysn Objective: Vitals last 24 hrs: Vitals:   03/25/23 0100 03/25/23 0344 03/25/23 0500 03/25/23 0857  BP: (!) 153/90  (!) 148/90   Pulse: 79     Resp: 20  18   Temp: 97.9 F (36.6 C)  98 F (36.7 C)   TempSrc: Oral  Oral   SpO2: 97% 94% 97% 97%  Weight:      Height:       Weight change:   Physical Examination: General exam: alert awake, older than stated age HEENT:Oral mucosa moist, Ear/Nose WNL grossly Respiratory system: bilaterally diminished  BS, no use of accessory muscle Cardiovascular system: S1 & S2 +, No JVD. Gastrointestinal system: Abdomen soft,NT,ND, BS+ Nervous System:Alert, awake, moving extremities. Extremities: LE edema neg,distal peripheral pulses palpable.  Skin: No rashes,no icterus. MSK: Normal muscle bulk,tone, power  Medications reviewed:  Scheduled Meds:  albuterol  2.5 mg Inhalation TID  dexamethasone (DECADRON) injection  6 mg Intravenous Q24H   enoxaparin (LOVENOX) injection  60 mg Subcutaneous Q24H   ferrous sulfate  325 mg Oral BID WC   fluticasone  2 spray Each Nare  Daily   gabapentin  100 mg Oral BID   insulin aspart  0-20 Units Subcutaneous TID WC   insulin aspart  0-5 Units Subcutaneous QHS   insulin glargine-yfgn  20 Units Subcutaneous QHS   latanoprost  1 drop Both Eyes QHS   lidocaine  1 patch Transdermal Q24H   loratadine  10 mg Oral Daily   metoprolol tartrate  100 mg Oral BID   multivitamin with minerals  1 tablet Oral Daily   triamterene-hydrochlorothiazide  1 capsule Oral Daily   Continuous Infusions:    Diet Order             Diet heart healthy/carb modified Room service appropriate? Yes; Fluid consistency: Thin  Diet effective now                  No intake or output data in the 24 hours ending 03/25/23 1202 Net IO Since Admission: No IO data has been entered for this period [03/25/23 1202]  Wt Readings from Last 3 Encounters:  03/24/23 130.5 kg  02/16/23 (!) 138.3 kg  12/15/22 (!) 138.3 kg     Unresulted Labs (From admission, onward)     Start     Ordered   03/26/23 0500  Comprehensive metabolic panel  Daily,   R      03/25/23 0811   03/26/23 0500  CBC  Daily,   R      03/25/23 0811   03/25/23 0810  C-reactive protein  Add-on,   AD        03/25/23 0809   03/25/23 0500  HIV Antibody (routine testing w rflx)  Once,   R        03/25/23 0500          Data Reviewed: I have personally reviewed following labs and imaging studies CBC: Recent Labs  Lab 03/24/23 1632 03/25/23 0505  WBC 9.0 7.2  NEUTROABS 6.8  --   HGB 12.3 12.4  HCT 38.9 40.5  MCV 87.2 87.9  PLT 262 254   Basic Metabolic Panel: Recent Labs  Lab 03/24/23 1632 03/25/23 0505  NA 136 135  K 3.7 4.0  CL 99 98  CO2 29 27  GLUCOSE 207* 256*  BUN 9 12  CREATININE 0.72 0.68  CALCIUM 8.5* 8.4*   GFR: Estimated Creatinine Clearance: 93.3 mL/min (by C-G formula based on SCr of 0.68 mg/dL). Liver Function Tests: CBG: Recent Labs  Lab 03/24/23 2229 03/25/23 0733 03/25/23 1138  GLUCAP 289* 228* 261*   No results found for this or any  previous visit (from the past 240 hour(s)).  Antimicrobials: Anti-infectives (From admission, onward)    None      Culture/Microbiology No results found for: "SDES", "SPECREQUEST", "CULT", "REPTSTATUS"   Radiology Studies: DG Chest Portable 1 View  Result Date: 03/24/2023 CLINICAL DATA:  Shortness of breath.  COVID positive. EXAM: PORTABLE CHEST 1 VIEW COMPARISON:  Radiograph earlier FINDINGS: Eventration of right hemidiaphragm. Low lung volumes. Bronchovascular crowding versus vascular congestion. Borderline cardiomegaly is unchanged from earlier today. No pneumothorax or large pleural effusion. IMPRESSION: 1. Low lung volumes with bronchovascular crowding versus vascular congestion. 2. Borderline cardiomegaly, unchanged from earlier today. Electronically Signed   By: Narda Rutherford M.D.   On: 03/24/2023 18:00  LOS: 1 day   Lanae Boast, MD [facility]  03/25/2023, 12:02 PM

## 2023-03-25 NOTE — Hospital Course (Addendum)
67 y.o.f w/ history significant for uncontrolled diabetes with hyperglycemia, hypertension, arthritis/knee pain/back pain, anemia who presented with 4 days of worsening shortness of breath, having a difficult time ambulating due to the shortness of breath.  She presented to urgent care and was diagnosed with COVID. Cxr was done and told to come to the hospital for evaluation. In the ED: Initially hypoxic 88% on room air-air needing 2 L of nasal cannula saturating in mid to upper 90s, hypertensive up to 213/59 needing IV hydralazine with good effect.  Labs showed a stable BMP with blood sugar 207, BNP slightly of 356 troponin 7, normal CBC.  Chest x-ray low lung volumes with bronchovascular crowding versus vascular congestion borderline cardiomegaly. Patient was admitted for further management. At this time patient is doing very well afebrile not hypoxic on room air at rest  Patient has been ambulating well kept for additional day due to hypoxia and ambulation at this time she is clinically improved if oxygen saturation remains stable on ambulation she will be discharged home Echocardiogram was resulted EF normal 70 to 70% no acute finding.

## 2023-03-26 ENCOUNTER — Ambulatory Visit: Payer: Medicare Other | Admitting: Family Medicine

## 2023-03-26 ENCOUNTER — Other Ambulatory Visit (HOSPITAL_COMMUNITY): Payer: Self-pay | Admitting: *Deleted

## 2023-03-26 ENCOUNTER — Inpatient Hospital Stay (HOSPITAL_COMMUNITY): Payer: Medicare Other

## 2023-03-26 DIAGNOSIS — J9601 Acute respiratory failure with hypoxia: Secondary | ICD-10-CM | POA: Diagnosis not present

## 2023-03-26 LAB — COMPREHENSIVE METABOLIC PANEL
ALT: 15 U/L (ref 0–44)
AST: 22 U/L (ref 15–41)
Albumin: 3.5 g/dL (ref 3.5–5.0)
Alkaline Phosphatase: 62 U/L (ref 38–126)
Anion gap: 11 (ref 5–15)
BUN: 14 mg/dL (ref 8–23)
CO2: 27 mmol/L (ref 22–32)
Calcium: 8.4 mg/dL — ABNORMAL LOW (ref 8.9–10.3)
Chloride: 98 mmol/L (ref 98–111)
Creatinine, Ser: 0.76 mg/dL (ref 0.44–1.00)
GFR, Estimated: >60 mL/min (ref >60)
Glucose, Bld: 198 mg/dL — ABNORMAL HIGH (ref 70–99)
Potassium: 4 mmol/L (ref 3.5–5.1)
Sodium: 136 mmol/L (ref 135–145)
Total Bilirubin: 0.6 mg/dL (ref 0.3–1.2)
Total Protein: 8 g/dL (ref 6.5–8.1)

## 2023-03-26 LAB — CBC
HCT: 38 % (ref 36.0–46.0)
Hemoglobin: 12.1 g/dL (ref 12.0–15.0)
MCH: 27.8 pg (ref 26.0–34.0)
MCHC: 31.8 g/dL (ref 30.0–36.0)
MCV: 87.2 fL (ref 80.0–100.0)
Platelets: 253 10*3/uL (ref 150–400)
RBC: 4.36 MIL/uL (ref 3.87–5.11)
RDW: 16.4 % — ABNORMAL HIGH (ref 11.5–15.5)
WBC: 10.1 10*3/uL (ref 4.0–10.5)
nRBC: 0 % (ref 0.0–0.2)

## 2023-03-26 LAB — GLUCOSE, CAPILLARY
Glucose-Capillary: 185 mg/dL — ABNORMAL HIGH (ref 70–99)
Glucose-Capillary: 133 mg/dL — ABNORMAL HIGH (ref 70–99)
Glucose-Capillary: 109 mg/dL — ABNORMAL HIGH (ref 70–99)
Glucose-Capillary: 175 mg/dL — ABNORMAL HIGH (ref 70–99)

## 2023-03-26 MED ORDER — FUROSEMIDE 20 MG PO TABS
20.0000 mg | ORAL_TABLET | Freq: Every day | ORAL | Status: DC
  Administered 2023-03-26: 20 mg via ORAL
  Filled 2023-03-26 (×2): qty 1

## 2023-03-26 MED ORDER — TRIAMTERENE-HCTZ 37.5-25 MG PO TABS
1.0000 | ORAL_TABLET | Freq: Every day | ORAL | Status: DC
  Filled 2023-03-26: qty 1

## 2023-03-26 NOTE — Plan of Care (Signed)
  Problem: Nutritional: Goal: Maintenance of adequate nutrition will improve Outcome: Progressing   Problem: Respiratory: Goal: Will maintain a patent airway Outcome: Progressing   Problem: Coping: Goal: Level of anxiety will decrease Outcome: Progressing   Problem: Pain Managment: Goal: General experience of comfort will improve Outcome: Progressing   Problem: Safety: Goal: Ability to remain free from injury will improve Outcome: Progressing

## 2023-03-26 NOTE — Progress Notes (Signed)
*  PRELIMINARY RESULTS* Echocardiogram 2D Echocardiogram has not been performed. Patient had an accident and needed cleaning. Will check back tomorrow.  Stacey Drain 03/26/2023, 4:29 PM

## 2023-03-26 NOTE — Progress Notes (Signed)
PROGRESS NOTE MAZIKEEN COODY  WUJ:[id number] DOB: [birth year] DOA: 03/24/2023 PCP: Tommie Sams, DO  Brief Narrative/Hospital Course: 67 y.o.f w/ history significant for uncontrolled diabetes with hyperglycemia, hypertension, arthritis/knee pain/back pain, anemia who presented with 4 days of worsening shortness of breath, having a difficult time ambulating due to the shortness of breath.  She presented to urgent care and was diagnosed with COVID. Cxr was done and told to come to the hospital for evaluation. In the ED: Initially hypoxic 88% on room air-air needing 2 L of nasal cannula saturating in mid to upper 90s, hypertensive up to 213/59 needing IV hydralazine with good effect.  Labs showed a stable BMP with blood sugar 207, BNP slightly of 356 troponin 7, normal CBC.  Chest x-ray low lung volumes with bronchovascular crowding versus vascular congestion borderline cardiomegaly. Patient was admitted for further management. At this time patient is doing very well afebrile not hypoxic on room air at rest     Subjective: Patient seen examined this morning overall she is feeling much better  Does not like the hospital food.   Later she ambulated with the nursing staff became hypoxic up to 88% on room air   Assessment and Plan: Principal Problem:   Acute respiratory failure with hypoxia (HCC) Active Problems:   Uncontrolled type 2 diabetes mellitus with hyperglycemia (HCC)   Essential hypertension   Morbid obesity (HCC)   Pneumonia due to COVID-19 virus   Hypertensive urgency   Acute hypoxic respiratory failure COVID-19 virus infection: Patient presented with shortness of breath hypoxia due to COVID-19 infection.  Symptom onset 4 days PTA. Chest x-ray without pneumonia, on admission felt to be outside of window for remdesivir/Paxlovid. Started on steroid, bronchodilators antitussives and continue the same, continue supplemental oxygen, encourage mobilization PT OT incentive spirometry hopefully  can wean off oxygen at the time of discharge. Resume home lasix Recent Labs    03/25/23 0828  CRP 5.2*    Hypertension with uncontrolled BP on admission: Currently well-controlled after increasing metoprolol.  Continue home Dyazide. She takes PRN Lasix. TTE obtained due to elevated BNP-result pending  Type 2 diabetes mellitus with uncontrolled hyperglycemia: PTA on Glucotrol, metformin.  Now on steroids and also placed on 20 units Semglee bedtime and SSI, monitor and adjust as below Recent Labs  Lab 03/25/23 1138 03/25/23 1644 03/25/23 2152 03/26/23 0738 03/26/23 1210  GLUCAP 261* 130* 177* 185* 133*     Morbid obesity with BMI 46: She will benefit with weight loss, PCP follow-up and healthy lifestyle.  Advised to do sleep apnea study as outpatient  Arthritis/knee pain/back pain: Cont her muscle relaxant, neurontin, lidocaine and pain management  DVT prophylaxis: SCDs Start: 03/24/23 2005 Code Status:   Code Status: Full Code Family Communication: plan of care discussed with patient at bedside. Patient status is: Inpatient because of COVID infection Level of care: Telemetry   Dispo: The patient is from: home            Anticipated disposition: home  tomorrow if no more hypoxia  Objective: Vitals last 24 hrs: Vitals:   03/25/23 2040 03/25/23 2142 03/26/23 0444 03/26/23 0820  BP: (!) 173/65  (!) 150/53   Pulse: 78  69   Resp: 19     Temp: 99.5 F (37.5 C)  99.4 F (37.4 C)   TempSrc: Oral  Oral   SpO2: 95% (!) 89% 93% 93%  Weight:      Height:       Weight change:  Physical Examination: General exam: alert awake, oriented  HEENT:Oral mucosa moist, Ear/Nose WNL grossly Respiratory system: Bilaterally clear BS,no use of accessory muscle Cardiovascular system: S1 & S2 +, No JVD. Gastrointestinal system: Abdomen soft,NT,ND, BS+ Nervous System: Alert, awake, moving all extremities,and following commands. Extremities: LE edema neg,distal peripheral pulses palpable  and warm.  Skin: No rashes,no icterus. MSK: Normal muscle bulk,tone, power   Medications reviewed:  Scheduled Meds:  albuterol  2.5 mg Inhalation TID   dexamethasone (DECADRON) injection  6 mg Intravenous Q24H   enoxaparin (LOVENOX) injection  60 mg Subcutaneous Q24H   ferrous sulfate  325 mg Oral BID WC   fluticasone  2 spray Each Nare Daily   gabapentin  100 mg Oral BID   insulin aspart  0-20 Units Subcutaneous TID WC   insulin aspart  0-5 Units Subcutaneous QHS   insulin glargine-yfgn  20 Units Subcutaneous QHS   latanoprost  1 drop Both Eyes QHS   lidocaine  1 patch Transdermal Q24H   loratadine  10 mg Oral Daily   metoprolol tartrate  100 mg Oral BID   multivitamin with minerals  1 tablet Oral Daily   triamterene-hydrochlorothiazide  1 capsule Oral Daily   Continuous Infusions:    Diet Order             Diet regular Room service appropriate? Yes; Fluid consistency: Thin  Diet effective now                   Intake/Output Summary (Last 24 hours) at 03/26/2023 1314 Last data filed at 03/26/2023 0900 Gross per 24 hour  Intake 240 ml  Output 100 ml  Net 140 ml   Net IO Since Admission: 620 mL [03/26/23 1314]  Wt Readings from Last 3 Encounters:  03/24/23 130.5 kg  02/16/23 (!) 138.3 kg  12/15/22 (!) 138.3 kg     Unresulted Labs (From admission, onward)     Start     Ordered   03/26/23 0500  Comprehensive metabolic panel  Daily,   R      03/25/23 0811   03/26/23 0500  CBC  Daily,   R      03/25/23 0811          Data Reviewed: I have personally reviewed following labs and imaging studies CBC: Recent Labs  Lab 03/24/23 1632 03/25/23 0505 03/26/23 0426  WBC 9.0 7.2 10.1  NEUTROABS 6.8  --   --   HGB 12.3 12.4 12.1  HCT 38.9 40.5 38.0  MCV 87.2 87.9 87.2  PLT 262 254 253   Basic Metabolic Panel: Recent Labs  Lab 03/24/23 1632 03/25/23 0505 03/26/23 0426  NA 136 135 136  K 3.7 4.0 4.0  CL 99 98 98  CO2 29 27 27   GLUCOSE 207* 256* 198*   BUN 9 12 14   CREATININE 0.72 0.68 0.76  CALCIUM 8.5* 8.4* 8.4*   GFR: Estimated Creatinine Clearance: 93.3 mL/min (by C-G formula based on SCr of 0.76 mg/dL). Liver Function Tests: CBG: Recent Labs  Lab 03/25/23 1138 03/25/23 1644 03/25/23 2152 03/26/23 0738 03/26/23 1210  GLUCAP 261* 130* 177* 185* 133*   No results found for this or any previous visit (from the past 240 hour(s)).  Antimicrobials: Anti-infectives (From admission, onward)    None      Culture/Microbiology No results found for: "SDES", "SPECREQUEST", "CULT", "REPTSTATUS"   Radiology Studies: DG Chest Portable 1 View  Result Date: 03/24/2023 CLINICAL DATA:  Shortness of breath.  COVID  positive. EXAM: PORTABLE CHEST 1 VIEW COMPARISON:  Radiograph earlier FINDINGS: Eventration of right hemidiaphragm. Low lung volumes. Bronchovascular crowding versus vascular congestion. Borderline cardiomegaly is unchanged from earlier today. No pneumothorax or large pleural effusion. IMPRESSION: 1. Low lung volumes with bronchovascular crowding versus vascular congestion. 2. Borderline cardiomegaly, unchanged from earlier today. Electronically Signed   By: Narda Rutherford M.D.   On: 03/24/2023 18:00     LOS: 2 days   Lanae Boast, MD [facility]  03/26/2023, 1:14 PM

## 2023-03-26 NOTE — Progress Notes (Signed)
Mobility Specialist Progress Note:    03/26/23 1145  Mobility  Activity Ambulated with assistance in room  Level of Assistance Minimal assist, patient does 75% or more  Assistive Device None;Cane  Distance Ambulated (ft) 8 ft  Range of Motion/Exercises Active;All extremities  Activity Response Tolerated well  Mobility Referral Yes  $Mobility charge 1 Mobility  Mobility Specialist Start Time (ACUTE ONLY) 1145  Mobility Specialist Stop Time (ACUTE ONLY) 1155  Mobility Specialist Time Calculation (min) (ACUTE ONLY) 10 min   RN requested assistance ambulating pt while monitoring SpO2. Pt received sitting EOB, agreeable to mobility. Required MinA to stand and ambulate with no AD. Tolerated well, see O2 sats below. Returned pt to bed, left in room with RN. All needs met.   Baseline at rest: SpO2 98% on RA During ambulation: SpO2 86% on RA Sitting EOB, recovering: SpO2 91% on RA  Deyjah Kindel Mobility Specialist Please contact via Special educational needs teacher or  Rehab office at [phone]

## 2023-03-26 NOTE — Discharge Instructions (Signed)
Rent/Utilities/Housing Agency: [employer] Address: [address] [place], [place] [address]  [address] [place], [place] [address]  Phone Number: [phone] or [phone] For the hearing-impaired - Dial 711 for [place] Name: [place] and [facility] Services Address: [place], [place], [place] [address] Phone: [phone] Website: [email or web address] Services Offered: Temporary financial assistance, subsidized housing, and utility  assistance   Agency Name: [employer]  Address: [address], [place], [place] [address] Phone: [phone] ext. [phone] Email: Contact: info'@[employer]'$ .org Website: [employer] Services Offered: Subsidized apartment rent based on income.   Agency Name: [place] Address: [facility], [place]. [place], [place] Phone: [phone]  Website: www.[facility].org Services Offered: Building services engineer, utility assistance Corporate treasurer for all of  [facility], [place], [employer]  May 25, 2020 [address] and [place] for [place] area only), rent assistance.   Agency Name: [facility] Address: [place], [place], [place] [address] / [address]  [place] Phone: [phone] [place] / [phone] [place]: [email or web address] [place]: Games developer, food, showers, hygiene products utility payment  assistance, thrift shops, rental assistance, Support Groups  Agency Name: [facility]  Address: [address] [place], [place] [address]  Phone: [phone] / [phone] Website: https://www.[facility].org/ Services Offered: Support groups for Bipolar, substance abuse, anger  management, panic depression and anxiety. Mobile crisis unit,  outpatient therapy, substance abuse treatment.  Agency Name: [place].   Address: [address] [place], [place] [address]  Phone: [phone] Website: www.helpinc-centeragainstviolence.org Services Offered: Support groups for domestic violence or sexual assault, support  group for elderly women and domestic assault.

## 2023-03-26 NOTE — Progress Notes (Addendum)
   03/26/23 1526  TOC Brief Assessment  Insurance and Status Reviewed  Patient has primary care physician Yes  Home environment has been reviewed Home with family  Prior level of function: indpendent  Prior/Current Home Services No current home services  Social Determinants of Health Reivew SDOH reviewed no interventions necessary  Readmission risk has been reviewed Yes  Transition of care needs no transition of care needs at this time   Patient lives with relatives.Housing resources added to AVS. TOC following.

## 2023-03-27 ENCOUNTER — Inpatient Hospital Stay (HOSPITAL_COMMUNITY): Payer: Medicare Other

## 2023-03-27 DIAGNOSIS — I5031 Acute diastolic (congestive) heart failure: Secondary | ICD-10-CM | POA: Diagnosis not present

## 2023-03-27 DIAGNOSIS — J9601 Acute respiratory failure with hypoxia: Secondary | ICD-10-CM | POA: Diagnosis not present

## 2023-03-27 LAB — ECHOCARDIOGRAM COMPLETE
AR max vel: 1.8 cm2
AV Area VTI: 1.85 cm2
AV Area mean vel: 1.84 cm2
AV Mean grad: 4 mm[Hg]
AV Peak grad: 7.7 mm[Hg]
Ao pk vel: 1.39 m/s
Area-P 1/2: 3.06 cm2
Height: 66 in
S' Lateral: 2.4 cm
Weight: 4603.2 [oz_av]

## 2023-03-27 LAB — COMPREHENSIVE METABOLIC PANEL
ALT: 17 U/L (ref 0–44)
AST: 26 U/L (ref 15–41)
Albumin: 3.5 g/dL (ref 3.5–5.0)
Alkaline Phosphatase: 59 U/L (ref 38–126)
Anion gap: 11 (ref 5–15)
BUN: 16 mg/dL (ref 8–23)
CO2: 28 mmol/L (ref 22–32)
Calcium: 8.4 mg/dL — ABNORMAL LOW (ref 8.9–10.3)
Chloride: 95 mmol/L — ABNORMAL LOW (ref 98–111)
Creatinine, Ser: 0.81 mg/dL (ref 0.44–1.00)
GFR, Estimated: >60 mL/min (ref >60)
Glucose, Bld: 305 mg/dL — ABNORMAL HIGH (ref 70–99)
Potassium: 4.3 mmol/L (ref 3.5–5.1)
Sodium: 134 mmol/L — ABNORMAL LOW (ref 135–145)
Total Bilirubin: 0.5 mg/dL (ref 0.3–1.2)
Total Protein: 8.4 g/dL — ABNORMAL HIGH (ref 6.5–8.1)

## 2023-03-27 LAB — GLUCOSE, CAPILLARY
Glucose-Capillary: 252 mg/dL — ABNORMAL HIGH (ref 70–99)
Glucose-Capillary: 230 mg/dL — ABNORMAL HIGH (ref 70–99)

## 2023-03-27 LAB — CBC
HCT: 40 % (ref 36.0–46.0)
Hemoglobin: 12.7 g/dL (ref 12.0–15.0)
MCH: 27.5 pg (ref 26.0–34.0)
MCHC: 31.8 g/dL (ref 30.0–36.0)
MCV: 86.8 fL (ref 80.0–100.0)
Platelets: 239 10*3/uL (ref 150–400)
RBC: 4.61 MIL/uL (ref 3.87–5.11)
RDW: 16.4 % — ABNORMAL HIGH (ref 11.5–15.5)
WBC: 6 10*3/uL (ref 4.0–10.5)
nRBC: 0 % (ref 0.0–0.2)

## 2023-03-27 MED ORDER — DEXAMETHASONE 6 MG PO TABS: 6.0000 mg | ORAL_TABLET | Freq: Every day | ORAL | 0 refills | Status: AC

## 2023-03-27 NOTE — Progress Notes (Signed)
Patient noted to have tele orders. Patient stating that her tele monitor was removed yesterday and was told she no longer needed it. Patient does not want to wear unless she has to, but is in agreement to have her daytime doctor make that decision.

## 2023-03-27 NOTE — Inpatient Diabetes Management (Signed)
Inpatient Diabetes Program Recommendations  AACE/ADA: New Consensus Statement on Inpatient Glycemic Control (2015)  Target Ranges:  Prepandial:   less than 140 mg/dL      Peak postprandial:   less than 180 mg/dL (1-2 hours)      Critically ill patients:  140 - 180 mg/dL   Lab Results  Component Value Date   GLUCAP 252 (H) 03/27/2023   HGBA1C 8.5 (H) 12/15/2022    Review of Glycemic Control  Latest Reference Range & Units 03/26/23 07:38 03/26/23 12:10 03/26/23 17:22 03/26/23 19:48 03/27/23 07:52  Glucose-Capillary 70 - 99 mg/dL 811 (H) 914 (H) 782 (H) 175 (H) 252 (H)   Diabetes history: DM 2 Outpatient Diabetes medications: Glipizide 10 mg bid, metformin 750 mg Daily Current orders for Inpatient glycemic control:  Semglee 20 units qhs Novolog 0-20 units tid + hs  Decadron 6 mg q24hrs scheduled at 1830  Note: fasting glucose trends in the mid 200 range  Inpatient Diabetes Program Recommendations:    -   Decrease Novolog correction scale to 0-15 units and reschedule frequency to Q4 hours for night time coverage since decadron is scheduled at dinner time and trends increase overnight leaving an elevated fasting glucose.  Thanks,  Christena Deem RN, MSN, BC-ADM Inpatient Diabetes Coordinator Team Pager [phone] (8a-5p)

## 2023-03-27 NOTE — Plan of Care (Signed)
  Problem: Health Behavior/Discharge Planning: Goal: Ability to manage health-related needs will improve Outcome: Progressing   Problem: Nutritional: Goal: Maintenance of adequate nutrition will improve Outcome: Not Progressing   Problem: Education: Goal: Knowledge of risk factors and measures for prevention of condition will improve Outcome: Progressing   Problem: Respiratory: Goal: Will maintain a patent airway Outcome: Progressing

## 2023-03-27 NOTE — Discharge Summary (Signed)
in Accession #:    [id number]    Weight:       287.7 lb Date of Birth:  [birth year]      BSA:          2.334 m Patient Age:    67 years      BP:           152/68 mmHg Patient Gender: F             HR:           55 bpm. Exam Location:  [facility] Procedure: 2D Echo, Cardiac Doppler and Color Doppler Indications:    CHF-Acute Diastolic I50.31  History:        Patient has prior history of Echocardiogram examinations, most                 recent 06/11/2017. Risk Factors:Hypertension and Diabetes.  Sonographer:    Darlys Gales Referring Phys: [id number] Sekai Nayak IMPRESSIONS  1. Left ventricular ejection fraction, by estimation, is 70 to 75%. The left ventricle has hyperdynamic function. Left ventricular endocardial border not optimally defined to evaluate regional wall motion. There is mild left ventricular hypertrophy. Left ventricular diastolic parameters are indeterminate.  2. Right ventricular systolic function is normal. The right ventricular size is normal.  3. Left atrial size was mildly dilated.  4. The mitral valve is grossly normal. Trivial mitral valve regurgitation. No evidence of mitral stenosis.  5. The aortic valve was not well visualized. Aortic valve regurgitation is not visualized. No aortic stenosis is present. Comparison(s): No prior Echocardiogram. FINDINGS  Left Ventricle: Left ventricular ejection fraction,  by estimation, is 70 to 75%. The left ventricle has hyperdynamic function. Left ventricular endocardial border not optimally defined to evaluate regional wall motion. The left ventricular internal cavity size was normal in size. There is mild left ventricular hypertrophy. Left ventricular diastolic parameters are indeterminate. Right Ventricle: The right ventricular size is normal. No increase in right ventricular wall thickness. Right ventricular systolic function is normal. Left Atrium: Left atrial size was mildly dilated. Right Atrium: Right atrial size was normal in size. Pericardium: There is no evidence of pericardial effusion. Mitral Valve: The mitral valve is grossly normal. Trivial mitral valve regurgitation. No evidence of mitral valve stenosis. Tricuspid Valve: The tricuspid valve is not well visualized. Tricuspid valve regurgitation is not demonstrated. No evidence of tricuspid stenosis. Aortic Valve: The aortic valve was not well visualized. Aortic valve regurgitation is not visualized. No aortic stenosis is present. Aortic valve mean gradient measures 4.0 mmHg. Aortic valve peak gradient measures 7.7 mmHg. Aortic valve area, by VTI measures 1.85 cm. Pulmonic Valve: The pulmonic valve was not well visualized. Pulmonic valve regurgitation is not visualized. No evidence of pulmonic stenosis. Aorta: The aortic root is normal in size and structure. Venous: The inferior vena cava was not well visualized. IAS/Shunts: The interatrial septum was not well visualized.  LEFT VENTRICLE PLAX 2D LVIDd:         4.10 cm   Diastology LVIDs:         2.40 cm   LV e' medial:    5.98 cm/s LV PW:         1.20 cm   LV E/e' medial:  16.7 LV IVS:        1.20 cm   LV e' lateral:   8.70 cm/s LVOT diam:     1.70 cm   LV E/e' lateral: 11.5 LV SV:  Physician Discharge Summary  Tracey Turner WUJ:[id number] DOB: 26-Oct-1955 DOA: 03/24/2023  PCP: Tommie Sams, DO  Admit date: 03/24/2023 Discharge date: 03/27/2023 Recommendations for Outpatient Follow-up:  Follow up with PCP in 1 weeks-call for appointment Please obtain BMP/CBC in one week  Discharge Dispo: Home Discharge Condition: Stable Code Status:   Code Status: Full Code Diet recommendation: Carbohydrate controlled diet   Brief/Interim Summary: 67 y.o.f w/ history significant for uncontrolled diabetes with hyperglycemia, hypertension, arthritis/knee pain/back pain, anemia who presented with 4 days of worsening shortness of breath, having a difficult time ambulating due to the shortness of breath.  She presented to urgent care and was diagnosed with COVID. Cxr was done and told to come to the hospital for evaluation. In the ED: Initially hypoxic 88% on room air-air needing 2 L of nasal cannula saturating in mid to upper 90s, hypertensive up to 213/59 needing IV hydralazine with good effect.  Labs showed a stable BMP with blood sugar 207, BNP slightly of 356 troponin 7, normal CBC.  Chest x-ray low lung volumes with bronchovascular crowding versus vascular congestion borderline cardiomegaly. Patient was admitted for further management. At this time patient is doing very well afebrile not hypoxic on room air at rest  Patient has been ambulating well kept for additional day due to hypoxia and ambulation at this time she is clinically improved if oxygen saturation remains stable on ambulation she will be discharged home Echocardiogram was resulted EF normal 70 to 70% no acute finding.   Discharge Diagnoses:  Principal Problem:   Acute respiratory failure with hypoxia (HCC) Active Problems:   Uncontrolled type 2 diabetes mellitus with hyperglycemia (HCC)   Essential hypertension   Morbid obesity (HCC)   Pneumonia due to COVID-19 virus   Hypertensive urgency  Acute hypoxic respiratory  failure COVID-19 virus infection: Patient presented with shortness of breath hypoxia due to COVID-19 infection.  Symptom onset 4 days PTA. Chest x-ray without pneumonia, on admission felt to be outside of window for remdesivir/Paxlovid. Started on steroid, bronchodilators antitussives and continue the same, continue supplemental oxygen, encourage mobilization PT OT incentive spirometry- she is weaned off oxygen and ambulated without need for oxygen.  She will complete course of steroid   Hypertension with uncontrolled BP on admission: Currently well-controlled after increasing metoprolol.  Continue home Dyazide. She takes PRN Lasix. TTE obtained due to elevated BNP-result unremarkable.    Type 2 diabetes mellitus with uncontrolled hyperglycemia: PTA on Glucotrol, metformin.  Placed on insulin while here, advised to monitor blood sugar closely upon discharge as she will be on steroid Recent Labs  Lab 03/26/23 1210 03/26/23 1722 03/26/23 1948 03/27/23 0752 03/27/23 1129  GLUCAP 133* 109* 175* 252* 230*     Morbid obesity with BMI 46: She will benefit with weight loss, PCP follow-up and healthy lifestyle.  Advised to do sleep apnea study as outpatient  Arthritis/knee pain/back pain: Cont her muscle relaxant, neurontin, lidocaine and pain management   Consults: none Subjective: Alert awake oriented resting comfortably no complaint and without need for oxygen and not hypoxic  Discharge Exam: Vitals:   03/27/23 1321 03/27/23 1428  BP: (!) 169/72   Pulse: (!) 53   Resp: 16   Temp: 98.1 F (36.7 C)   SpO2: 93% 94%   General: Pt is alert, awake, not in acute distress Cardiovascular: RRR, S1/S2 +, no rubs, no gallops Respiratory: CTA bilaterally, no wheezing, no rhonchi Abdominal: Soft, NT, ND, bowel sounds + Extremities: no edema, no cyanosis  Physician Discharge Summary  Tracey Turner WUJ:[id number] DOB: 26-Oct-1955 DOA: 03/24/2023  PCP: Tommie Sams, DO  Admit date: 03/24/2023 Discharge date: 03/27/2023 Recommendations for Outpatient Follow-up:  Follow up with PCP in 1 weeks-call for appointment Please obtain BMP/CBC in one week  Discharge Dispo: Home Discharge Condition: Stable Code Status:   Code Status: Full Code Diet recommendation: Carbohydrate controlled diet   Brief/Interim Summary: 67 y.o.f w/ history significant for uncontrolled diabetes with hyperglycemia, hypertension, arthritis/knee pain/back pain, anemia who presented with 4 days of worsening shortness of breath, having a difficult time ambulating due to the shortness of breath.  She presented to urgent care and was diagnosed with COVID. Cxr was done and told to come to the hospital for evaluation. In the ED: Initially hypoxic 88% on room air-air needing 2 L of nasal cannula saturating in mid to upper 90s, hypertensive up to 213/59 needing IV hydralazine with good effect.  Labs showed a stable BMP with blood sugar 207, BNP slightly of 356 troponin 7, normal CBC.  Chest x-ray low lung volumes with bronchovascular crowding versus vascular congestion borderline cardiomegaly. Patient was admitted for further management. At this time patient is doing very well afebrile not hypoxic on room air at rest  Patient has been ambulating well kept for additional day due to hypoxia and ambulation at this time she is clinically improved if oxygen saturation remains stable on ambulation she will be discharged home Echocardiogram was resulted EF normal 70 to 70% no acute finding.   Discharge Diagnoses:  Principal Problem:   Acute respiratory failure with hypoxia (HCC) Active Problems:   Uncontrolled type 2 diabetes mellitus with hyperglycemia (HCC)   Essential hypertension   Morbid obesity (HCC)   Pneumonia due to COVID-19 virus   Hypertensive urgency  Acute hypoxic respiratory  failure COVID-19 virus infection: Patient presented with shortness of breath hypoxia due to COVID-19 infection.  Symptom onset 4 days PTA. Chest x-ray without pneumonia, on admission felt to be outside of window for remdesivir/Paxlovid. Started on steroid, bronchodilators antitussives and continue the same, continue supplemental oxygen, encourage mobilization PT OT incentive spirometry- she is weaned off oxygen and ambulated without need for oxygen.  She will complete course of steroid   Hypertension with uncontrolled BP on admission: Currently well-controlled after increasing metoprolol.  Continue home Dyazide. She takes PRN Lasix. TTE obtained due to elevated BNP-result unremarkable.    Type 2 diabetes mellitus with uncontrolled hyperglycemia: PTA on Glucotrol, metformin.  Placed on insulin while here, advised to monitor blood sugar closely upon discharge as she will be on steroid Recent Labs  Lab 03/26/23 1210 03/26/23 1722 03/26/23 1948 03/27/23 0752 03/27/23 1129  GLUCAP 133* 109* 175* 252* 230*     Morbid obesity with BMI 46: She will benefit with weight loss, PCP follow-up and healthy lifestyle.  Advised to do sleep apnea study as outpatient  Arthritis/knee pain/back pain: Cont her muscle relaxant, neurontin, lidocaine and pain management   Consults: none Subjective: Alert awake oriented resting comfortably no complaint and without need for oxygen and not hypoxic  Discharge Exam: Vitals:   03/27/23 1321 03/27/23 1428  BP: (!) 169/72   Pulse: (!) 53   Resp: 16   Temp: 98.1 F (36.7 C)   SpO2: 93% 94%   General: Pt is alert, awake, not in acute distress Cardiovascular: RRR, S1/S2 +, no rubs, no gallops Respiratory: CTA bilaterally, no wheezing, no rhonchi Abdominal: Soft, NT, ND, bowel sounds + Extremities: no edema, no cyanosis  Physician Discharge Summary  Tracey Turner WUJ:[id number] DOB: 26-Oct-1955 DOA: 03/24/2023  PCP: Tommie Sams, DO  Admit date: 03/24/2023 Discharge date: 03/27/2023 Recommendations for Outpatient Follow-up:  Follow up with PCP in 1 weeks-call for appointment Please obtain BMP/CBC in one week  Discharge Dispo: Home Discharge Condition: Stable Code Status:   Code Status: Full Code Diet recommendation: Carbohydrate controlled diet   Brief/Interim Summary: 67 y.o.f w/ history significant for uncontrolled diabetes with hyperglycemia, hypertension, arthritis/knee pain/back pain, anemia who presented with 4 days of worsening shortness of breath, having a difficult time ambulating due to the shortness of breath.  She presented to urgent care and was diagnosed with COVID. Cxr was done and told to come to the hospital for evaluation. In the ED: Initially hypoxic 88% on room air-air needing 2 L of nasal cannula saturating in mid to upper 90s, hypertensive up to 213/59 needing IV hydralazine with good effect.  Labs showed a stable BMP with blood sugar 207, BNP slightly of 356 troponin 7, normal CBC.  Chest x-ray low lung volumes with bronchovascular crowding versus vascular congestion borderline cardiomegaly. Patient was admitted for further management. At this time patient is doing very well afebrile not hypoxic on room air at rest  Patient has been ambulating well kept for additional day due to hypoxia and ambulation at this time she is clinically improved if oxygen saturation remains stable on ambulation she will be discharged home Echocardiogram was resulted EF normal 70 to 70% no acute finding.   Discharge Diagnoses:  Principal Problem:   Acute respiratory failure with hypoxia (HCC) Active Problems:   Uncontrolled type 2 diabetes mellitus with hyperglycemia (HCC)   Essential hypertension   Morbid obesity (HCC)   Pneumonia due to COVID-19 virus   Hypertensive urgency  Acute hypoxic respiratory  failure COVID-19 virus infection: Patient presented with shortness of breath hypoxia due to COVID-19 infection.  Symptom onset 4 days PTA. Chest x-ray without pneumonia, on admission felt to be outside of window for remdesivir/Paxlovid. Started on steroid, bronchodilators antitussives and continue the same, continue supplemental oxygen, encourage mobilization PT OT incentive spirometry- she is weaned off oxygen and ambulated without need for oxygen.  She will complete course of steroid   Hypertension with uncontrolled BP on admission: Currently well-controlled after increasing metoprolol.  Continue home Dyazide. She takes PRN Lasix. TTE obtained due to elevated BNP-result unremarkable.    Type 2 diabetes mellitus with uncontrolled hyperglycemia: PTA on Glucotrol, metformin.  Placed on insulin while here, advised to monitor blood sugar closely upon discharge as she will be on steroid Recent Labs  Lab 03/26/23 1210 03/26/23 1722 03/26/23 1948 03/27/23 0752 03/27/23 1129  GLUCAP 133* 109* 175* 252* 230*     Morbid obesity with BMI 46: She will benefit with weight loss, PCP follow-up and healthy lifestyle.  Advised to do sleep apnea study as outpatient  Arthritis/knee pain/back pain: Cont her muscle relaxant, neurontin, lidocaine and pain management   Consults: none Subjective: Alert awake oriented resting comfortably no complaint and without need for oxygen and not hypoxic  Discharge Exam: Vitals:   03/27/23 1321 03/27/23 1428  BP: (!) 169/72   Pulse: (!) 53   Resp: 16   Temp: 98.1 F (36.7 C)   SpO2: 93% 94%   General: Pt is alert, awake, not in acute distress Cardiovascular: RRR, S1/S2 +, no rubs, no gallops Respiratory: CTA bilaterally, no wheezing, no rhonchi Abdominal: Soft, NT, ND, bowel sounds + Extremities: no edema, no cyanosis  in Accession #:    [id number]    Weight:       287.7 lb Date of Birth:  [birth year]      BSA:          2.334 m Patient Age:    67 years      BP:           152/68 mmHg Patient Gender: F             HR:           55 bpm. [facility] Procedure: 2D Echo, Cardiac Doppler and Color Doppler Indications:    CHF-Acute Diastolic I50.31  History:        Patient has prior history of Echocardiogram examinations, most                 recent 06/11/2017. Risk Factors:Hypertension and Diabetes.  Sonographer:    Darlys Gales Referring Phys: [id number] Sekai Nayak IMPRESSIONS  1. Left ventricular ejection fraction, by estimation, is 70 to 75%. The left ventricle has hyperdynamic function. Left ventricular endocardial border not optimally defined to evaluate regional wall motion. There is mild left ventricular hypertrophy. Left ventricular diastolic parameters are indeterminate.  2. Right ventricular systolic function is normal. The right ventricular size is normal.  3. Left atrial size was mildly dilated.  4. The mitral valve is grossly normal. Trivial mitral valve regurgitation. No evidence of mitral stenosis.  5. The aortic valve was not well visualized. Aortic valve regurgitation is not visualized. No aortic stenosis is present. Comparison(s): No prior Echocardiogram. FINDINGS  Left Ventricle: Left ventricular ejection fraction,  by estimation, is 70 to 75%. The left ventricle has hyperdynamic function. Left ventricular endocardial border not optimally defined to evaluate regional wall motion. The left ventricular internal cavity size was normal in size. There is mild left ventricular hypertrophy. Left ventricular diastolic parameters are indeterminate. Right Ventricle: The right ventricular size is normal. No increase in right ventricular wall thickness. Right ventricular systolic function is normal. Left Atrium: Left atrial size was mildly dilated. Right Atrium: Right atrial size was normal in size. Pericardium: There is no evidence of pericardial effusion. Mitral Valve: The mitral valve is grossly normal. Trivial mitral valve regurgitation. No evidence of mitral valve stenosis. Tricuspid Valve: The tricuspid valve is not well visualized. Tricuspid valve regurgitation is not demonstrated. No evidence of tricuspid stenosis. Aortic Valve: The aortic valve was not well visualized. Aortic valve regurgitation is not visualized. No aortic stenosis is present. Aortic valve mean gradient measures 4.0 mmHg. Aortic valve peak gradient measures 7.7 mmHg. Aortic valve area, by VTI measures 1.85 cm. Pulmonic Valve: The pulmonic valve was not well visualized. Pulmonic valve regurgitation is not visualized. No evidence of pulmonic stenosis. Aorta: The aortic root is normal in size and structure. Venous: The inferior vena cava was not well visualized. IAS/Shunts: The interatrial septum was not well visualized.  LEFT VENTRICLE PLAX 2D LVIDd:         4.10 cm   Diastology LVIDs:         2.40 cm   LV e' medial:    5.98 cm/s LV PW:         1.20 cm   LV E/e' medial:  16.7 LV IVS:        1.20 cm   LV e' lateral:   8.70 cm/s LVOT diam:     1.70 cm   LV E/e' lateral: 11.5 LV SV:  in Accession #:    [id number]    Weight:       287.7 lb Date of Birth:  [birth year]      BSA:          2.334 m Patient Age:    67 years      BP:           152/68 mmHg Patient Gender: F             HR:           55 bpm. [facility] Procedure: 2D Echo, Cardiac Doppler and Color Doppler Indications:    CHF-Acute Diastolic I50.31  History:        Patient has prior history of Echocardiogram examinations, most                 recent 06/11/2017. Risk Factors:Hypertension and Diabetes.  Sonographer:    Darlys Gales Referring Phys: [id number] Sekai Nayak IMPRESSIONS  1. Left ventricular ejection fraction, by estimation, is 70 to 75%. The left ventricle has hyperdynamic function. Left ventricular endocardial border not optimally defined to evaluate regional wall motion. There is mild left ventricular hypertrophy. Left ventricular diastolic parameters are indeterminate.  2. Right ventricular systolic function is normal. The right ventricular size is normal.  3. Left atrial size was mildly dilated.  4. The mitral valve is grossly normal. Trivial mitral valve regurgitation. No evidence of mitral stenosis.  5. The aortic valve was not well visualized. Aortic valve regurgitation is not visualized. No aortic stenosis is present. Comparison(s): No prior Echocardiogram. FINDINGS  Left Ventricle: Left ventricular ejection fraction,  by estimation, is 70 to 75%. The left ventricle has hyperdynamic function. Left ventricular endocardial border not optimally defined to evaluate regional wall motion. The left ventricular internal cavity size was normal in size. There is mild left ventricular hypertrophy. Left ventricular diastolic parameters are indeterminate. Right Ventricle: The right ventricular size is normal. No increase in right ventricular wall thickness. Right ventricular systolic function is normal. Left Atrium: Left atrial size was mildly dilated. Right Atrium: Right atrial size was normal in size. Pericardium: There is no evidence of pericardial effusion. Mitral Valve: The mitral valve is grossly normal. Trivial mitral valve regurgitation. No evidence of mitral valve stenosis. Tricuspid Valve: The tricuspid valve is not well visualized. Tricuspid valve regurgitation is not demonstrated. No evidence of tricuspid stenosis. Aortic Valve: The aortic valve was not well visualized. Aortic valve regurgitation is not visualized. No aortic stenosis is present. Aortic valve mean gradient measures 4.0 mmHg. Aortic valve peak gradient measures 7.7 mmHg. Aortic valve area, by VTI measures 1.85 cm. Pulmonic Valve: The pulmonic valve was not well visualized. Pulmonic valve regurgitation is not visualized. No evidence of pulmonic stenosis. Aorta: The aortic root is normal in size and structure. Venous: The inferior vena cava was not well visualized. IAS/Shunts: The interatrial septum was not well visualized.  LEFT VENTRICLE PLAX 2D LVIDd:         4.10 cm   Diastology LVIDs:         2.40 cm   LV e' medial:    5.98 cm/s LV PW:         1.20 cm   LV E/e' medial:  16.7 LV IVS:        1.20 cm   LV e' lateral:   8.70 cm/s LVOT diam:     1.70 cm   LV E/e' lateral: 11.5 LV SV:

## 2023-03-27 NOTE — Progress Notes (Signed)
Mobility Specialist Progress Note:    03/27/23 1400  Mobility  Activity Ambulated with assistance in room  Level of Assistance Standby assist, set-up cues, supervision of patient - no hands on  Assistive Device None  Distance Ambulated (ft) 35 ft  Range of Motion/Exercises Active;All extremities  Activity Response Tolerated well  Mobility Referral Yes  $Mobility charge 1 Mobility  Mobility Specialist Start Time (ACUTE ONLY) 1400  Mobility Specialist Stop Time (ACUTE ONLY) 1410  Mobility Specialist Time Calculation (min) (ACUTE ONLY) 10 min   Pt received sitting EOB, agreeable to mobility. Required supervision to stand and ambulate with no AD. Tolerated well, SpO2 94% on RA at rest. SpO2 89% on RA during ambulation. Recovered, SpO2 94% on RA after session. Left pt sitting EOB, all needs met.   Lawerance Bach Mobility Specialist Please contact via Special educational needs teacher or  Rehab office at [phone]

## 2023-03-29 ENCOUNTER — Telehealth: Payer: Self-pay

## 2023-03-29 NOTE — Transitions of Care (Post Inpatient/ED Visit) (Signed)
03/29/2023  Name: Tracey Turner MRN: [id number] DOB: 1956-04-25  Today's TOC FU Call Status: Today's TOC FU Call Status:: Unsuccessful Call (1st Attempt) Unsuccessful Call (1st Attempt) Date: 03/29/23  Attempted to reach the patient regarding the most recent Inpatient/ED visit.  Follow Up Plan: Additional outreach attempts will be made to reach the patient to complete the Transitions of Care (Post Inpatient/ED visit) call.   Deidre Ala, RN Medical illustrator [facility] [phone]

## 2023-03-30 ENCOUNTER — Telehealth: Payer: Self-pay

## 2023-03-30 NOTE — Transitions of Care (Post Inpatient/ED Visit) (Signed)
03/30/2023  Name: Tracey Turner MRN: [id number] DOB: May 27, 1956  Today's TOC FU Call Status: Today's TOC FU Call Status:: Unsuccessful Call (2nd Attempt) Unsuccessful Call (2nd Attempt) Date: 03/30/23  Attempted to reach the patient regarding the most recent Inpatient/ED visit.  Follow Up Plan: Additional outreach attempts will be made to reach the patient to complete the Transitions of Care (Post Inpatient/ED visit) call.    Deidre Ala, RN Medical illustrator [facility] [phone]

## 2023-04-02 ENCOUNTER — Telehealth: Payer: Self-pay

## 2023-04-02 NOTE — Transitions of Care (Post Inpatient/ED Visit) (Signed)
04/02/2023  Name: Tracey Turner MRN: [id number] DOB: Feb 15, 1956  Today's TOC FU Call Status: Today's TOC FU Call Status:: Successful TOC FU Call Completed TOC FU Call Complete Date: 04/02/23 Patient's Name and Date of Birth confirmed.  Transition Care Management Follow-up Telephone Call Date of Discharge: 04/02/23 Discharge Facility: [facility]) Type of Discharge: Inpatient Admission Primary Inpatient Discharge Diagnosis:: Cough How have you been since you were released from the hospital?: Same Any questions or concerns?: Yes Patient Questions/Concerns:: The patient's brother has Covid and she has been sick with it and still feels weak. She would like a note from the provider to be out of work Patient Questions/Concerns Addressed: Psychologist, educational of Patient Questions/Concerns  Items Reviewed: Did you receive and understand the discharge instructions provided?: Yes Medications obtained,verified, and reconciled?: Yes (Medications Reviewed) Any new allergies since your discharge?: No Dietary orders reviewed?: No Do you have support at home?: No  Medications Reviewed Today: Medications Reviewed Today   Medications were not reviewed in this encounter     Home Care and Equipment/Supplies: Were Home Health Services Ordered?: No Any new equipment or medical supplies ordered?: No  Functional Questionnaire: Do you need assistance with bathing/showering or dressing?: No Do you need assistance with meal preparation?: No Do you need assistance with eating?: No Do you have difficulty maintaining continence: No Do you need assistance with getting out of bed/getting out of a chair/moving?: No Do you have difficulty managing or taking your medications?: No  Follow up appointments reviewed: PCP Follow-up appointment confirmed?: No MD Provider Line Number:[phone] Given: Yes Specialist Hospital Follow-up appointment confirmed?: NA Do you need transportation to your follow-up  appointment?: No Do you understand care options if your condition(s) worsen?: Yes-patient verbalized understanding  The patient went to the ED last week for a cough and she tested positive for Covid. She still feels weak. Her brother came to see her and he may have Covid. She states she hasn't called for a follow up appointment because she doesn't want to spread Covid. She will call today to schedule. She would like the provider to mail her an out of work letter to her home   Deidre Ala, Programmer, systems [facility] [phone]

## 2023-04-05 ENCOUNTER — Encounter: Payer: Self-pay | Admitting: Family Medicine

## 2023-04-09 ENCOUNTER — Ambulatory Visit: Payer: Medicare Other | Admitting: Family Medicine

## 2023-04-09 VITALS — BP 155/82 | HR 72 | Temp 98.1°F | Ht 66.0 in | Wt 297.4 lb

## 2023-04-09 DIAGNOSIS — U071 COVID-19: Secondary | ICD-10-CM | POA: Diagnosis not present

## 2023-04-09 DIAGNOSIS — E1165 Type 2 diabetes mellitus with hyperglycemia: Secondary | ICD-10-CM | POA: Diagnosis not present

## 2023-04-09 DIAGNOSIS — I10 Essential (primary) hypertension: Secondary | ICD-10-CM

## 2023-04-09 MED ORDER — SITAGLIPTIN PHOSPHATE 100 MG PO TABS: 100.0000 mg | ORAL_TABLET | Freq: Every day | ORAL | 3 refills | Status: DC

## 2023-04-09 MED ORDER — PROMETHAZINE-DM 6.25-15 MG/5ML PO SYRP: 5.0000 mL | ORAL_SOLUTION | Freq: Four times a day (QID) | ORAL | 0 refills | Status: DC | PRN

## 2023-04-09 NOTE — Assessment & Plan Note (Signed)
Uncontrolled.  Recommended compliance with triamterene/HCTZ.

## 2023-04-09 NOTE — Progress Notes (Signed)
Subjective:  Patient ID: Tracey Turner, female    DOB: 11/05/1955  Age: 66 y.o. MRN: [id number]  CC: Feeling poorly   HPI:  67 year old female with morbid obesity, hypertension, uncontrolled type 2 diabetes, GERD, hyperlipidemia presents for evaluation of the above.  Patient recently hospitalized from 10/5 to 10/8 with hypoxic respiratory failure secondary to COVID.  Chest x-ray was negative for pneumonia.  She was given corticosteroids, bronchodilators, antitussives and supplemental oxygen.  She was slowly weaned off oxygen.  Patient has completed course of corticosteroids.  Patient reports that she just does not feel well.  She is having severe fatigue.  Some cough.  No fever.  Some associated shortness of breath.  Needs note for work.  Patient also reports that she is not tolerating metformin.  She would like to discontinue this medication and start something else regarding her diabetes.  Blood pressure uncontrolled.  Patient has not been taking her triamterene/HCTZ.  Will discuss today.  Patient Active Problem List   Diagnosis Date Noted   COVID-19 04/09/2023   Low back pain 11/25/2021   Hyperlipidemia 04/15/2021   Varicose veins of both legs with edema 02/02/2020   Ocular hypertension 12/05/2019   GERD (gastroesophageal reflux disease) 06/15/2014   Morbid obesity (HCC) 12/18/2013   Uncontrolled type 2 diabetes mellitus with hyperglycemia (HCC) 09/12/2012   Essential hypertension 09/12/2012   Arthritis of knee 02/23/2011    Social Hx   Social History   Socioeconomic History   Marital status: Single    Spouse name: Not on file   Number of children: Not on file   Years of education: some coll.   Highest education level: Not on file  Occupational History   Occupation: [employer]    Employer: [employer]  Tobacco Use   Smoking status: Former    Current packs/day: 0.00    Average packs/day: 1 pack/day for 36.9 years (36.9 ttl pk-yrs)    Types: Cigarettes    Start  date: 07/22/1970    Quit date: 06/21/2007    Years since quitting: 15.8   Smokeless tobacco: Never  Vaping Use   Vaping status: Never Used  Substance and Sexual Activity   Alcohol use: No    Alcohol/week: 0.0 standard drinks of alcohol   Drug use: No   Sexual activity: Not Currently    Birth control/protection: Post-menopausal  Other Topics Concern   Not on file  Social History Narrative   Not on file   Social Determinants of Health   Financial Resource Strain: Low Risk  (02/16/2023)   Overall Financial Resource Strain (CARDIA)    Difficulty of Paying Living Expenses: Not hard at all  Food Insecurity: No Food Insecurity (03/24/2023)   Hunger Vital Sign    Worried About Running Out of Food in the Last Year: Never true    Ran Out of Food in the Last Year: Never true  Transportation Needs: Patient Declined (03/24/2023)   PRAPARE - Transportation    Lack of Transportation (Medical): Patient declined    Lack of Transportation (Non-Medical): Patient declined  Physical Activity: Inactive (02/16/2023)   Exercise Vital Sign    Days of Exercise per Week: 0 days    Minutes of Exercise per Session: 0 min  Stress: No Stress Concern Present (02/16/2023)   [employer] of Occupational Health - Occupational Stress Questionnaire    Feeling of Stress : Not at all  Social Connections: Moderately Isolated (02/16/2023)   Social Connection and Isolation Panel [NHANES]  Frequency of Communication with Friends and Family: More than three times a week    Frequency of Social Gatherings with Friends and Family: More than three times a week    Attends Religious Services: More than 4 times per year    Active Member of [employer] or Organizations: No    Attends Engineer, structural: Never    Marital Status: Never married    Review of Systems Per HPI  Objective:  BP (!) 155/82   Pulse 72   Temp 98.1 F (36.7 C)   Ht 5\' 6"  (1.676 m)   Wt 297 lb 6.4 oz (134.9 kg)   SpO2 96%   BMI 48.00  kg/m      04/09/2023    3:38 PM 04/09/2023    3:02 PM 03/27/2023    1:21 PM  BP/Weight  Systolic BP 155 169 169  Diastolic BP 82 84 72  Wt. (Lbs)  297.4   BMI  48 kg/m2     Physical Exam Vitals and nursing note reviewed.  Constitutional:      Appearance: Normal appearance. She is obese.  HENT:     Head: Normocephalic and atraumatic.  Eyes:     General:        Right eye: No discharge.        Left eye: No discharge.     Conjunctiva/sclera: Conjunctivae normal.  Cardiovascular:     Rate and Rhythm: Normal rate and regular rhythm.  Pulmonary:     Effort: Pulmonary effort is normal.     Breath sounds: No wheezing or rales.  Neurological:     Mental Status: She is alert.  Psychiatric:     Comments: Flat affect.     Lab Results  Component Value Date   WBC 6.0 03/27/2023   HGB 12.7 03/27/2023   HCT 40.0 03/27/2023   PLT 239 03/27/2023   GLUCOSE 305 (H) 03/27/2023   CHOL 140 12/15/2022   TRIG 122 12/15/2022   HDL 55 12/15/2022   LDLCALC 63 12/15/2022   ALT 17 03/27/2023   AST 26 03/27/2023   NA 134 (L) 03/27/2023   K 4.3 03/27/2023   CL 95 (L) 03/27/2023   CREATININE 0.81 03/27/2023   BUN 16 03/27/2023   CO2 28 03/27/2023   TSH 0.725 06/03/2021   HGBA1C 8.5 (H) 12/15/2022   MICROALBUR 2.53 (H) 09/22/2013     Assessment & Plan:   Problem List Items Addressed This Visit       Cardiovascular and Mediastinum   Essential hypertension    Uncontrolled.  Recommended compliance with triamterene/HCTZ.        Endocrine   Uncontrolled type 2 diabetes mellitus with hyperglycemia (HCC)    Uncontrolled.  Patient is not tolerating metformin.  Stopping.  Starting Januvia.      Relevant Medications   sitaGLIPtin (JANUVIA) 100 MG tablet     Other   COVID-19 - Primary    Patient experiencing post-COVID symptoms.  Has not yet returned to her baseline.  Work note given.  If patient needs FMLA she will bring this by our office or have it faxed.  There was no  evidence of pneumonia.  I see no indication for repeat x-ray.  Promethazine DM for cough.  Supportive care and rest.       Meds ordered this encounter  Medications   sitaGLIPtin (JANUVIA) 100 MG tablet    Sig: Take 1 tablet (100 mg total) by mouth daily.    Dispense:  90  tablet    Refill:  3   promethazine-dextromethorphan (PROMETHAZINE-DM) 6.25-15 MG/5ML syrup    Sig: Take 5 mLs by mouth 4 (four) times daily as needed for cough.    Dispense:  118 mL    Refill:  0    Follow-up:  Return in about 3 months (around 07/10/2023) for Follow up Chronic medical issues.  Everlene Other DO [facility]

## 2023-04-09 NOTE — Assessment & Plan Note (Signed)
Patient experiencing post-COVID symptoms.  Has not yet returned to her baseline.  Work note given.  If patient needs FMLA she will bring this by our office or have it faxed.  There was no evidence of pneumonia.  I see no indication for repeat x-ray.  Promethazine DM for cough.  Supportive care and rest.

## 2023-04-09 NOTE — Patient Instructions (Signed)
Take the Dyazide daily.  Stop Metformin. Rx sent for Januvia.  Rest.  If you work needs additional FMLA please let me know.  Follow up in 3 months.

## 2023-04-09 NOTE — Assessment & Plan Note (Signed)
Uncontrolled.  Patient is not tolerating metformin.  Stopping.  Starting Januvia.

## 2023-04-20 ENCOUNTER — Other Ambulatory Visit: Payer: Self-pay | Admitting: Family Medicine

## 2023-04-20 NOTE — Telephone Encounter (Signed)
Refill on lidocaine (LIDODERM) 5 % ,  albuterol (VENTOLIN HFA) 108 (90 Base) MCG/ACT inhaler  Send to [facility]

## 2023-04-22 MED ORDER — ALBUTEROL SULFATE HFA 108 (90 BASE) MCG/ACT IN AERS: INHALATION_SPRAY | RESPIRATORY_TRACT | 0 refills | Status: DC

## 2023-04-24 ENCOUNTER — Telehealth: Payer: Self-pay

## 2023-04-24 MED ORDER — ALBUTEROL SULFATE HFA 108 (90 BASE) MCG/ACT IN AERS: INHALATION_SPRAY | RESPIRATORY_TRACT | 0 refills | Status: DC

## 2023-04-24 MED ORDER — LIDOCAINE 5 % EX PTCH: MEDICATED_PATCH | CUTANEOUS | 0 refills | Status: DC

## 2023-04-24 NOTE — Telephone Encounter (Signed)
Prescription Request  04/24/2023  LOV: Visit date not found  What is the name of the medication or equipment? Albuterol SUL HFA 90 MCG INH  Have you contacted your pharmacy to request a refill? Yes   Which pharmacy would you like this sent to?  [facility] [place], [place] - [address] [place] [address] Phone: [phone] Fax: [phone]    Patient notified that their request is being sent to the clinical staff for review and that they should receive a response within 2 business days.   Please advise at Mobile [phone] (mobile)

## 2023-04-24 NOTE — Telephone Encounter (Signed)
Prescription Request  04/24/2023  LOV: Visit date not found  What is the name of the medication or equipment? lidocaine (LIDODERM) 5 %   Have you contacted your pharmacy to request a refill? Yes   Which pharmacy would you like this sent to?  [facility] [place], [place] - [address] [place] [address] Phone: [phone] Fax: [phone]    Patient notified that their request is being sent to the clinical staff for review and that they should receive a response within 2 business days.   Please advise at Mobile [phone] (mobile)

## 2023-05-22 ENCOUNTER — Other Ambulatory Visit: Payer: Self-pay | Admitting: Family Medicine

## 2023-06-01 DIAGNOSIS — S81802A Unspecified open wound, left lower leg, initial encounter: Secondary | ICD-10-CM | POA: Diagnosis not present

## 2023-06-01 DIAGNOSIS — I10 Essential (primary) hypertension: Secondary | ICD-10-CM | POA: Diagnosis not present

## 2023-06-19 ENCOUNTER — Emergency Department (HOSPITAL_COMMUNITY): Payer: Medicare Other

## 2023-06-19 ENCOUNTER — Other Ambulatory Visit: Payer: Self-pay

## 2023-06-19 ENCOUNTER — Other Ambulatory Visit: Payer: Self-pay | Admitting: Family Medicine

## 2023-06-19 ENCOUNTER — Encounter (HOSPITAL_COMMUNITY): Payer: Self-pay | Admitting: *Deleted

## 2023-06-19 ENCOUNTER — Emergency Department (HOSPITAL_COMMUNITY)
Admission: EM | Admit: 2023-06-19 | Discharge: 2023-06-19 | Disposition: A | Discharge status: Home / Self Care | Payer: Medicare Other

## 2023-06-19 DIAGNOSIS — W19XXXA Unspecified fall, initial encounter: Secondary | ICD-10-CM | POA: Diagnosis not present

## 2023-06-19 DIAGNOSIS — M5459 Other low back pain: Secondary | ICD-10-CM | POA: Diagnosis not present

## 2023-06-19 DIAGNOSIS — Z79899 Other long term (current) drug therapy: Secondary | ICD-10-CM | POA: Diagnosis not present

## 2023-06-19 DIAGNOSIS — M25552 Pain in left hip: Secondary | ICD-10-CM | POA: Insufficient documentation

## 2023-06-19 DIAGNOSIS — R930 Abnormal findings on diagnostic imaging of skull and head, not elsewhere classified: Secondary | ICD-10-CM | POA: Insufficient documentation

## 2023-06-19 DIAGNOSIS — Z743 Need for continuous supervision: Secondary | ICD-10-CM | POA: Diagnosis not present

## 2023-06-19 DIAGNOSIS — I10 Essential (primary) hypertension: Secondary | ICD-10-CM | POA: Insufficient documentation

## 2023-06-19 DIAGNOSIS — R6889 Other general symptoms and signs: Secondary | ICD-10-CM | POA: Diagnosis not present

## 2023-06-19 DIAGNOSIS — M25551 Pain in right hip: Secondary | ICD-10-CM | POA: Insufficient documentation

## 2023-06-19 DIAGNOSIS — I517 Cardiomegaly: Secondary | ICD-10-CM | POA: Diagnosis not present

## 2023-06-19 DIAGNOSIS — M549 Dorsalgia, unspecified: Secondary | ICD-10-CM | POA: Diagnosis not present

## 2023-06-19 DIAGNOSIS — Z7984 Long term (current) use of oral hypoglycemic drugs: Secondary | ICD-10-CM | POA: Diagnosis not present

## 2023-06-19 DIAGNOSIS — M545 Low back pain, unspecified: Secondary | ICD-10-CM | POA: Insufficient documentation

## 2023-06-19 DIAGNOSIS — R9389 Abnormal findings on diagnostic imaging of other specified body structures: Secondary | ICD-10-CM | POA: Diagnosis not present

## 2023-06-19 DIAGNOSIS — Z043 Encounter for examination and observation following other accident: Secondary | ICD-10-CM | POA: Diagnosis not present

## 2023-06-19 DIAGNOSIS — I700 Atherosclerosis of aorta: Secondary | ICD-10-CM | POA: Diagnosis not present

## 2023-06-19 MED ORDER — ACETAMINOPHEN 325 MG PO TABS
650.0000 mg | ORAL_TABLET | Freq: Once | ORAL | Status: AC
  Administered 2023-06-19: 650 mg via ORAL
  Filled 2023-06-19: qty 2

## 2023-06-19 MED ORDER — LIDOCAINE 5 % EX PTCH
1.0000 | MEDICATED_PATCH | CUTANEOUS | Status: DC
  Administered 2023-06-19: 1 via TRANSDERMAL
  Filled 2023-06-19: qty 1

## 2023-06-19 MED ORDER — LIDOCAINE 4 % EX PTCH: 1.0000 | MEDICATED_PATCH | CUTANEOUS | 0 refills | Status: AC

## 2023-06-19 MED ORDER — ONDANSETRON 4 MG PO TBDP
4.0000 mg | ORAL_TABLET | Freq: Once | ORAL | Status: AC
  Administered 2023-06-19: 4 mg via ORAL
  Filled 2023-06-19: qty 1

## 2023-06-19 MED ORDER — OXYCODONE-ACETAMINOPHEN 5-325 MG PO TABS
1.0000 | ORAL_TABLET | Freq: Once | ORAL | Status: AC
  Administered 2023-06-19: 1 via ORAL
  Filled 2023-06-19: qty 1

## 2023-06-19 NOTE — Discharge Instructions (Signed)
 Continue to take your home tramadol  or Flexeril  for pain.  We also prescribed you lidocaine  patch.  Please follow-up with your primary doctor.  Return immediately if develop fevers, chills, sudden onset headache, facial droop, unilateral weakness, chest pain, shortness of breath, weakness in your lower extremities, difficulty urinating or have bowel or bladder incontinence.  Also, please return if develop any new or worsening symptoms that are concerning to you.

## 2023-06-19 NOTE — ED Notes (Signed)
Pt declines bedpan, pt using bedside commode

## 2023-06-19 NOTE — ED Triage Notes (Signed)
 Pt in from home via [facility], per report pt was going to work and when stepping off the stairs missed the last step landing on her L side, c/o hip pain and lower back pain, no shortening or rotation of L leg reported, pt denies hitting head, denies LOC, CBG 115 pta, A&O x4

## 2023-06-19 NOTE — ED Provider Notes (Signed)
  EMERGENCY DEPARTMENT AT [facility] Provider Note   CSN: [id number] Arrival date & time: 06/19/23  0710     History  Chief Complaint  Patient presents with  . Fall    Tracey Turner is a 67 y.o. female.  Is a 67 year old female presenting emergency department after mechanical fall.  States she was going down the front steps when she missed the last step, fell down onto her left side.  Did not hit her head no LOC.  No nausea or vomiting.  Complained of pain to her left side initially, now complaining of low back pain and to her bilateral hips..  She has a history of chronic low back pain for which she takes tramadol .  No weakness in her lower extremities.  No saddle anesthesia.  she also complains of some chest congestion/cough has been going on for the past several days.  No fevers or chills.  No chest pain.   Fall       Home Medications Prior to Admission medications   Medication Sig Start Date End Date Taking? Authorizing Provider  acetaminophen  (TYLENOL ) 650 MG CR tablet Take 650 mg by mouth every 8 (eight) hours as needed. arthritis    [provider]  albuterol  (VENTOLIN  HFA) 108 (90 Base) MCG/ACT inhaler INHALE TWO PUFFS EVERY 6 HOURS AS NEEDED FOR WHEEZING OR SHORTNESS OF BREATH 04/24/23   Cook, Jayce G, DO  Calcium  Carbonate-Vitamin D (CALCIUM  + D PO) Take by mouth daily.     [provider]  cetirizine (ZYRTEC) 10 MG tablet Take 10 mg by mouth daily.    [provider]  cyclobenzaprine  (FLEXERIL ) 10 MG tablet Take 1 tablet (10 mg total) by mouth 3 (three) times daily as needed for muscle spasms. Muscle spasms 08/04/22   Cook, Jayce G, DO  ferrous sulfate  dried (SLOW FE) 160 (50 FE) MG TBCR Take 160 mg by mouth 3 (three) times daily with meals.    [provider]  fluticasone  (FLONASE ) 50 MCG/ACT nasal spray Place 2 sprays into both nostrils daily. 12/15/22   Cook, Jayce G, DO  furosemide  (LASIX ) 20 MG tablet TAKE 1  TABLET BY MOUTH ONCE DAILY AS NEEDED FOR EDEMA 12/15/22   Cook, Jayce G, DO  gabapentin  (NEURONTIN ) 100 MG capsule Take 1 capsule (100 mg total) by mouth 2 (two) times daily. 12/15/22   Cook, Jayce G, DO  glipiZIDE  (GLUCOTROL ) 10 MG tablet Take 1 tablet (10 mg total) by mouth 2 (two) times daily before a meal. 12/15/22   Cook, Jayce G, DO  hydroquinone  4 % cream Apply 1 Application topically 2 (two) times daily as needed (dark patches).    [provider]  latanoprost  (XALATAN ) 0.005 % ophthalmic solution INSTILL 1 DROP IN EACH EYE  AT BEDTIME 09/28/17   Hoskins, Carolyn C, NP  lidocaine  (LIDODERM ) 5 % APPLY ONE PATCH ONCE DAILY FOR 12 HOURS ON AND 12 HOURS off 05/23/23   Cook, Jayce G, DO  metoprolol  tartrate (LOPRESSOR ) 50 MG tablet Take 1 tablet (50 mg total) by mouth 2 (two) times daily. 12/15/22   Cook, Jayce G, DO  Multiple Vitamin (MULTIVITAMIN WITH MINERALS) TABS Take 1 tablet by mouth daily.    [provider]  promethazine -dextromethorphan  (PROMETHAZINE -DM) 6.25-15 MG/5ML syrup Take 5 mLs by mouth 4 (four) times daily as needed for cough. 04/09/23   Cook, Jayce G, DO  sitaGLIPtin  (JANUVIA ) 100 MG tablet Take 1 tablet (100 mg total) by mouth daily. 04/09/23  Cook, Jayce G, DO  traMADol  (ULTRAM ) 50 MG tablet Take 1 tablet (50 mg total) by mouth every 12 (twelve) hours as needed for moderate pain or severe pain. 12/15/22   Cook, Jayce G, DO  triamterene -hydrochlorothiazide  (DYAZIDE ) 37.5-25 MG capsule Take 1 each (1 capsule total) by mouth daily. Patient not taking: Reported on 04/09/2023 12/15/22   Cook, Jayce G, DO      Allergies    Ace inhibitors, Penicillins, Protonix  [pantoprazole ], Norvasc  [amlodipine ], Amoxicillin, Cholecalciferol, Empagliflozin , Metformin  and related, Nexium [esomeprazole magnesium], Nsaids, Omeprazole , Prevacid  [lansoprazole ], and Victoza [liraglutide]    Review of Systems   Review of Systems  Physical Exam Updated Vital Signs BP (!) 166/67   Pulse  75   Temp 98.3 F (36.8 C) (Oral)   Ht 5' 6 (1.676 m)   Wt 136.1 kg   SpO2 96%   BMI 48.42 kg/m  Physical Exam Vitals and nursing note reviewed.  Constitutional:      General: She is not in acute distress.    Appearance: She is obese. She is not toxic-appearing.  HENT:     Head: Normocephalic.     Nose: Nose normal.  Eyes:     Conjunctiva/sclera: Conjunctivae normal.  Cardiovascular:     Rate and Rhythm: Normal rate and regular rhythm.     Pulses: Normal pulses.  Pulmonary:     Effort: Pulmonary effort is normal.     Breath sounds: Normal breath sounds.  Abdominal:     General: Abdomen is flat. There is no distension.     Tenderness: There is no abdominal tenderness. There is no guarding or rebound.  Musculoskeletal:     Cervical back: Normal range of motion. No tenderness.     Comments: No midline spinal tenderness.  Chest wall stable nontender.  Pelvis stable nontender.  5 out of 5 plantarflexion dorsiflexion.  5 out of 5 bicep strength tricep strength.  Normal sensation through all extremities.  Equal pulses as well.  Neurological:     Mental Status: She is alert and oriented to person, place, and time.  Psychiatric:        Mood and Affect: Mood normal.        Behavior: Behavior normal.     ED Results / Procedures / Treatments   Labs (all labs ordered are listed, but only abnormal results are displayed) Labs Reviewed - No data to display  EKG None  Radiology DG Chest Portable 1 View Result Date: 06/19/2023 CLINICAL DATA:  Fall. EXAM: PORTABLE CHEST 1 VIEW COMPARISON:  Chest radiograph March 24, 2023. FINDINGS: Stable borderline cardiomegaly. Aortic atherosclerosis. Similar elevation of the right hemidiaphragm. No focal consolidation, sizeable pleural effusion, or pneumothorax. No acute osseous abnormality identified. IMPRESSION: No acute cardiopulmonary findings. Electronically Signed   By: Harrietta Sherry M.D.   On: 06/19/2023 08:44   DG Pelvis  Portable Result Date: 06/19/2023 CLINICAL DATA:  fall EXAM: PORTABLE PELVIS 1-2 VIEWS COMPARISON:  None Available. FINDINGS: There is no evidence of pelvic fracture or diastasis. No pelvic bone lesions are seen. The sacroiliac joints and pubic symphysis appear anatomically aligned. IMPRESSION: No acute osseous abnormality identified on AP pelvic radiograph. Electronically Signed   By: Harrietta Sherry M.D.   On: 06/19/2023 08:39    Procedures Procedures    Medications Ordered in ED Medications  lidocaine  (LIDODERM ) 5 % 1 patch (1 patch Transdermal Patch Applied 06/19/23 0808)  oxyCODONE -acetaminophen  (PERCOCET/ROXICET) 5-325 MG per tablet 1 tablet (1 tablet Oral Given 06/19/23 0806)  acetaminophen  (TYLENOL )  tablet 650 mg (650 mg Oral Given 06/19/23 9193)    ED Course/ Medical Decision Making/ A&P Clinical Course as of 06/19/23 1616  Tue Jun 19, 2023  9053 Patient reports improvement with multimodal pain medications.  Imaging reviewed.  No acute abnormalities.  Will discharge at this time.  Patient does have home tramadol  and Flexeril  that she takes for her chronic back pain.  Will also discharge with lidocaine  patches.  Follow-up with PCP. [TY]    Clinical Course User Index [TY] Neysa Caron PARAS, DO                                 Medical Decision Making There is a well-appearing 67 year old female presenting emergency department after mechanical fall.  He is afebrile nontachycardic.  Hypertensive.  Maintaining oxygen saturation on room air.  On exam no overt injuries.  Some minor tenderness to her left hip into her musculature of her low back.  But overall reassuring exam.  Given age CT head obtained.  Also given age and trauma with chronic back pain will get CT lumbar spine.  She has complained of some URI symptoms, but no fever or tachycardia to suggest systemic infection.  Chest x-ray today without trauma, but also without pneumonia.  She was having some hip pain.  However x-ray pelvis  negative for acute fracture.  Treated with multimodal pain medications.  See ED course for final MDM/disposition.  Amount and/or Complexity of Data Reviewed Radiology: ordered.  Risk OTC drugs. Prescription drug management.           Final Clinical Impression(s) / ED Diagnoses Final diagnoses:  None    Rx / DC Orders ED Discharge Orders     None         Neysa Caron PARAS, DO 06/19/23 1616

## 2023-06-26 ENCOUNTER — Other Ambulatory Visit: Payer: Self-pay | Admitting: Family Medicine

## 2023-06-26 DIAGNOSIS — M545 Low back pain, unspecified: Secondary | ICD-10-CM | POA: Diagnosis not present

## 2023-06-27 ENCOUNTER — Telehealth: Payer: Self-pay

## 2023-06-27 NOTE — Progress Notes (Signed)
 Transition Care Management Follow-up Telephone Call Date of discharge and from where: 06/19/2023 [facility] How have you been since you were released from the hospital? Patient stated her pain level is still high and she went to the urgent care the next day. The over the counter lidocaine  patches were not effective. Any questions or concerns? No  Items Reviewed: Did the pt receive and understand the discharge instructions provided? Yes  Medications obtained and verified? Yes  Other? No  Any new allergies since your discharge? No  Dietary orders reviewed? Yes Do you have support at home? Yes   Follow up appointments reviewed:  PCP Hospital f/u appt confirmed? Yes  Scheduled to see Jayce G. Bluford, DO on 07/10/2023 @ [facility]. Specialist Hospital f/u appt confirmed? No  Scheduled to see  on  @ . Are transportation arrangements needed? No  If their condition worsens, is the pt aware to call PCP or go to the Emergency Dept.? Yes Was the patient provided with contact information for the PCP's office or ED? Yes Was to pt encouraged to call back with questions or concerns? Yes   Khyleigh Furney [facility]  [facility] Guide Direct Dial: [phone]  Website: [email or web address]

## 2023-07-03 DIAGNOSIS — Z0279 Encounter for issue of other medical certificate: Secondary | ICD-10-CM

## 2023-07-03 DIAGNOSIS — M545 Low back pain, unspecified: Secondary | ICD-10-CM | POA: Diagnosis not present

## 2023-07-03 DIAGNOSIS — M62830 Muscle spasm of back: Secondary | ICD-10-CM | POA: Diagnosis not present

## 2023-07-10 ENCOUNTER — Ambulatory Visit (INDEPENDENT_AMBULATORY_CARE_PROVIDER_SITE_OTHER): Payer: Medicare Other | Admitting: Family Medicine

## 2023-07-10 VITALS — BP 138/74 | HR 57 | Temp 97.3°F | Ht 66.0 in | Wt 294.2 lb

## 2023-07-10 DIAGNOSIS — M545 Low back pain, unspecified: Secondary | ICD-10-CM

## 2023-07-10 DIAGNOSIS — E1165 Type 2 diabetes mellitus with hyperglycemia: Secondary | ICD-10-CM

## 2023-07-10 DIAGNOSIS — G8929 Other chronic pain: Secondary | ICD-10-CM | POA: Diagnosis not present

## 2023-07-10 DIAGNOSIS — I10 Essential (primary) hypertension: Secondary | ICD-10-CM

## 2023-07-10 DIAGNOSIS — Z7984 Long term (current) use of oral hypoglycemic drugs: Secondary | ICD-10-CM | POA: Diagnosis not present

## 2023-07-10 MED ORDER — HYDROQUINONE 4 % EX CREA: 1.0000 | TOPICAL_CREAM | Freq: Two times a day (BID) | CUTANEOUS | 1 refills | Status: DC | PRN

## 2023-07-10 MED ORDER — CYCLOBENZAPRINE HCL 10 MG PO TABS: 10.0000 mg | ORAL_TABLET | Freq: Three times a day (TID) | ORAL | 1 refills | Status: AC | PRN

## 2023-07-10 NOTE — Patient Instructions (Signed)
Labs today.  Heat to the back.  Follow up in 3 months.  Take care  Dr. Adriana Simas

## 2023-07-11 ENCOUNTER — Encounter: Payer: Self-pay | Admitting: Family Medicine

## 2023-07-11 LAB — HEMOGLOBIN A1C
Est. average glucose Bld gHb Est-mCnc: 180 mg/dL
Hgb A1c MFr Bld: 7.9 % — ABNORMAL HIGH (ref 4.8–5.6)

## 2023-07-11 LAB — MICROALBUMIN / CREATININE URINE RATIO
Creatinine, Urine: 280.3 mg/dL
Microalb/Creat Ratio: 17 mg/g{creat} (ref 0–29)
Microalbumin, Urine: 48.7 ug/mL

## 2023-07-11 NOTE — Assessment & Plan Note (Signed)
A1c returned at 7.9.  Improved from prior but still uncontrolled.  She will continue Januvia and glipizide.  Therapeutic options limited in the setting of allergies/intolerances.  Recommend referral to endocrinology.

## 2023-07-11 NOTE — Progress Notes (Signed)
Subjective:  Patient ID: Tracey Turner, female    DOB: 08/30/1955  Age: 68 y.o. MRN: [id number]  CC: Follow-up   HPI:  68 year old female with uncontrolled type 2 diabetes, morbid obesity, osteoarthritis, hyperlipidemia, hypertension presents for follow-up.  Patient recently suffered a fall and has been dealing with back pain.  Slowly improving.  Hypertension stable on current medications.  Needs A1c to reassess type 2 diabetes.  She is currently on Januvia 100 mg daily and glipizide 10 mg twice daily.  She reports that she has had a few episodes of hypoglycemia with blood sugar in the 60s.  Patient Active Problem List   Diagnosis Date Noted   Low back pain 11/25/2021   Hyperlipidemia 04/15/2021   Varicose veins of both legs with edema 02/02/2020   Ocular hypertension 12/05/2019   GERD (gastroesophageal reflux disease) 06/15/2014   Morbid obesity (HCC) 12/18/2013   Uncontrolled type 2 diabetes mellitus with hyperglycemia (HCC) 09/12/2012   Essential hypertension 09/12/2012   Arthritis of knee 02/23/2011    Social Hx   Social History   Socioeconomic History   Marital status: Single    Spouse name: Not on file   Number of children: Not on file   Years of education: some coll.   Highest education level: Not on file  Occupational History   Occupation: [employer]    Employer: [employer]  Tobacco Use   Smoking status: Former    Current packs/day: 0.00    Average packs/day: 1 pack/day for 36.9 years (36.9 ttl pk-yrs)    Types: Cigarettes    Start date: 07/22/1970    Quit date: 06/21/2007    Years since quitting: 16.0   Smokeless tobacco: Never  Vaping Use   Vaping status: Never Used  Substance and Sexual Activity   Alcohol use: No    Alcohol/week: 0.0 standard drinks of alcohol   Drug use: No   Sexual activity: Not Currently    Birth control/protection: Post-menopausal  Other Topics Concern   Not on file  Social History Narrative   Not on file   Social  Drivers of Health   Financial Resource Strain: Low Risk  (02/16/2023)   Overall Financial Resource Strain (CARDIA)    Difficulty of Paying Living Expenses: Not hard at all  Food Insecurity: No Food Insecurity (06/27/2023)   Hunger Vital Sign    Worried About Running Out of Food in the Last Year: Never true    Ran Out of Food in the Last Year: Never true  Transportation Needs: Patient Declined (03/24/2023)   PRAPARE - Transportation    Lack of Transportation (Medical): Patient declined    Lack of Transportation (Non-Medical): Patient declined  Physical Activity: Inactive (02/16/2023)   Exercise Vital Sign    Days of Exercise per Week: 0 days    Minutes of Exercise per Session: 0 min  Stress: No Stress Concern Present (02/16/2023)   [employer] of Occupational Health - Occupational Stress Questionnaire    Feeling of Stress : Not at all  Social Connections: Moderately Isolated (02/16/2023)   Social Connection and Isolation Panel [NHANES]    Frequency of Communication with Friends and Family: More than three times a week    Frequency of Social Gatherings with Friends and Family: More than three times a week    Attends Religious Services: More than 4 times per year    Active Member of [employer] or Organizations: No    Attends Banker Meetings: Never    Marital  Status: Never married    Review of Systems Per HPI  Objective:  BP 138/74   Pulse (!) 57   Temp (!) 97.3 F (36.3 C)   Ht 5\' 6"  (1.676 m)   Wt 294 lb 3.2 oz (133.4 kg)   SpO2 99%   BMI 47.49 kg/m      07/10/2023    1:16 PM 06/19/2023    9:52 AM 06/19/2023    7:30 AM  BP/Weight  Systolic BP 138 165 166  Diastolic BP 74 67 67  Wt. (Lbs) 294.2    BMI 47.49 kg/m2      Physical Exam Vitals and nursing note reviewed.  Constitutional:      General: She is not in acute distress.    Appearance: Normal appearance. She is obese.  HENT:     Head: Normocephalic and atraumatic.  Eyes:     General:         Right eye: No discharge.        Left eye: No discharge.     Conjunctiva/sclera: Conjunctivae normal.  Cardiovascular:     Rate and Rhythm: Normal rate and regular rhythm.  Pulmonary:     Effort: Pulmonary effort is normal.     Breath sounds: Normal breath sounds. No wheezing, rhonchi or rales.  Neurological:     Mental Status: She is alert.  Psychiatric:        Mood and Affect: Mood normal.        Behavior: Behavior normal.     Lab Results  Component Value Date   WBC 6.0 03/27/2023   HGB 12.7 03/27/2023   HCT 40.0 03/27/2023   PLT 239 03/27/2023   GLUCOSE 305 (H) 03/27/2023   CHOL 140 12/15/2022   TRIG 122 12/15/2022   HDL 55 12/15/2022   LDLCALC 63 12/15/2022   ALT 17 03/27/2023   AST 26 03/27/2023   NA 134 (L) 03/27/2023   K 4.3 03/27/2023   CL 95 (L) 03/27/2023   CREATININE 0.81 03/27/2023   BUN 16 03/27/2023   CO2 28 03/27/2023   TSH 0.725 06/03/2021   HGBA1C 7.9 (H) 07/10/2023   MICROALBUR 2.53 (H) 09/22/2013     Assessment & Plan:   Problem List Items Addressed This Visit       Cardiovascular and Mediastinum   Essential hypertension   Stable.  Continue current medications.          Endocrine   Uncontrolled type 2 diabetes mellitus with hyperglycemia (HCC) - Primary   A1c returned at 7.9.  Improved from prior but still uncontrolled.  She will continue Januvia and glipizide.  Therapeutic options limited in the setting of allergies/intolerances.  Recommend referral to endocrinology.      Relevant Orders   Hemoglobin A1c (Completed)   Microalbumin / creatinine urine ratio (Completed)     Other   Low back pain   Flexeril refilled.      Relevant Medications   cyclobenzaprine (FLEXERIL) 10 MG tablet    Meds ordered this encounter  Medications   hydroquinone 4 % cream    Sig: Apply 1 Application topically 2 (two) times daily as needed (dark patches).    Dispense:  28.35 g    Refill:  1   cyclobenzaprine (FLEXERIL) 10 MG tablet    Sig: Take 1  tablet (10 mg total) by mouth 3 (three) times daily as needed for muscle spasms. Muscle spasms    Dispense:  90 tablet    Refill:  1  Follow-up:  Return in about 3 months (around 10/08/2023).  Everlene Other DO [facility]

## 2023-07-11 NOTE — Assessment & Plan Note (Addendum)
Stable.  Continue current medications.

## 2023-07-11 NOTE — Assessment & Plan Note (Signed)
Flexeril refilled.

## 2023-07-12 ENCOUNTER — Other Ambulatory Visit: Payer: Self-pay

## 2023-07-12 DIAGNOSIS — E1165 Type 2 diabetes mellitus with hyperglycemia: Secondary | ICD-10-CM

## 2023-07-14 ENCOUNTER — Other Ambulatory Visit: Payer: Self-pay | Admitting: Family Medicine

## 2023-07-16 ENCOUNTER — Telehealth: Payer: Self-pay | Admitting: Family Medicine

## 2023-07-16 ENCOUNTER — Other Ambulatory Visit: Payer: Self-pay | Admitting: Family Medicine

## 2023-07-16 MED ORDER — ALBUTEROL SULFATE HFA 108 (90 BASE) MCG/ACT IN AERS: INHALATION_SPRAY | RESPIRATORY_TRACT | 0 refills | Status: DC

## 2023-07-16 NOTE — Telephone Encounter (Signed)
Patient had FMLA faxed over to be completed in your box.Papers need to faxed by 1/31

## 2023-08-21 ENCOUNTER — Other Ambulatory Visit: Payer: Self-pay | Admitting: Family Medicine

## 2023-08-27 DIAGNOSIS — H40053 Ocular hypertension, bilateral: Secondary | ICD-10-CM | POA: Diagnosis not present

## 2023-08-31 DIAGNOSIS — I890 Lymphedema, not elsewhere classified: Secondary | ICD-10-CM | POA: Diagnosis not present

## 2023-08-31 DIAGNOSIS — R600 Localized edema: Secondary | ICD-10-CM | POA: Diagnosis not present

## 2023-09-03 DIAGNOSIS — M545 Low back pain, unspecified: Secondary | ICD-10-CM | POA: Diagnosis not present

## 2023-09-11 ENCOUNTER — Other Ambulatory Visit: Payer: Self-pay | Admitting: Family Medicine

## 2023-10-05 ENCOUNTER — Other Ambulatory Visit (HOSPITAL_COMMUNITY): Payer: Self-pay | Admitting: Family Medicine

## 2023-10-05 DIAGNOSIS — Z1231 Encounter for screening mammogram for malignant neoplasm of breast: Secondary | ICD-10-CM

## 2023-10-08 ENCOUNTER — Ambulatory Visit (HOSPITAL_COMMUNITY)
Admission: RE | Admit: 2023-10-08 | Discharge: 2023-10-08 | Disposition: A | Discharge status: Home / Self Care | Source: Ambulatory Visit | Attending: Family Medicine | Admitting: Family Medicine

## 2023-10-08 ENCOUNTER — Encounter: Payer: Self-pay | Admitting: Family Medicine

## 2023-10-08 ENCOUNTER — Ambulatory Visit (INDEPENDENT_AMBULATORY_CARE_PROVIDER_SITE_OTHER): Payer: Medicare Other | Admitting: Family Medicine

## 2023-10-08 VITALS — BP 130/74 | HR 59 | Ht 66.0 in | Wt 305.0 lb

## 2023-10-08 DIAGNOSIS — G8929 Other chronic pain: Secondary | ICD-10-CM | POA: Diagnosis not present

## 2023-10-08 DIAGNOSIS — Z7984 Long term (current) use of oral hypoglycemic drugs: Secondary | ICD-10-CM

## 2023-10-08 DIAGNOSIS — Z1231 Encounter for screening mammogram for malignant neoplasm of breast: Secondary | ICD-10-CM | POA: Insufficient documentation

## 2023-10-08 DIAGNOSIS — M1710 Unilateral primary osteoarthritis, unspecified knee: Secondary | ICD-10-CM

## 2023-10-08 DIAGNOSIS — M545 Low back pain, unspecified: Secondary | ICD-10-CM | POA: Diagnosis not present

## 2023-10-08 DIAGNOSIS — E1165 Type 2 diabetes mellitus with hyperglycemia: Secondary | ICD-10-CM | POA: Diagnosis not present

## 2023-10-08 DIAGNOSIS — I10 Essential (primary) hypertension: Secondary | ICD-10-CM

## 2023-10-08 DIAGNOSIS — E7800 Pure hypercholesterolemia, unspecified: Secondary | ICD-10-CM

## 2023-10-08 MED ORDER — GLIPIZIDE 10 MG PO TABS: 10.0000 mg | ORAL_TABLET | Freq: Two times a day (BID) | ORAL | 3 refills | Status: DC

## 2023-10-08 MED ORDER — TRIAMTERENE-HCTZ 37.5-25 MG PO CAPS: 1.0000 | ORAL_CAPSULE | Freq: Every day | ORAL | 3 refills | Status: AC

## 2023-10-08 MED ORDER — METOPROLOL TARTRATE 50 MG PO TABS: 50.0000 mg | ORAL_TABLET | Freq: Two times a day (BID) | ORAL | 3 refills | Status: AC

## 2023-10-08 NOTE — Assessment & Plan Note (Signed)
 Uncontrolled.  Treatment options now limited due to intolerance of multiple medications.  She has not tolerated GLP-1 medication, SGLT2, metformin , DPP 4.  Will likely need insulin  therapy.  Would greatly benefit from a weight loss.  Obtaining A1c.  Continue glipizide .  Has upcoming endocrinology appointment.

## 2023-10-08 NOTE — Assessment & Plan Note (Signed)
 Last LDL was 63.  Reassessing with lipid panel.

## 2023-10-08 NOTE — Progress Notes (Signed)
 Subjective:  Patient ID: Tracey Turner, female    DOB: December 14, 1955  Age: 68 y.o. MRN: [id number]  CC:   Chief Complaint  Patient presents with   Diabetes    Stopped januvia  and takes metformin  sparingly due to more pain Has upcoming endocrine appt    Fall    Fell January 2025, still has pain in right low back , buttock, hip area. Multiple joint pain, leg weakness, pain in ankles    HPI:  68 year old female presents for follow-up.  Patient's diabetes remains uncontrolled.  She recently stopped Januvia  due to joint pain.  She is currently taking glipizide .  She states she takes metformin  occasionally as it gives her diarrhea.  Metformin  no longer on her medication list.  Hypertension stable on metoprolol , Lasix , and triamterene /HCTZ.  Patient continues to have chronic low back pain and also reports left ankle pain.  She uses tramadol  as directed.  She also uses muscle relaxers and gabapentin .  Patient Active Problem List   Diagnosis Date Noted   Low back pain 11/25/2021   Hyperlipidemia 04/15/2021   Varicose veins of both legs with edema 02/02/2020   Ocular hypertension 12/05/2019   GERD (gastroesophageal reflux disease) 06/15/2014   Morbid obesity (HCC) 12/18/2013   Uncontrolled type 2 diabetes mellitus with hyperglycemia (HCC) 09/12/2012   Essential hypertension 09/12/2012   Arthritis of knee 02/23/2011    Social Hx   Social History   Socioeconomic History   Marital status: Single    Spouse name: Not on file   Number of children: Not on file   Years of education: some coll.   Highest education level: Not on file  Occupational History   Occupation: [employer] [place] [employer]    Employer: [employer]  Tobacco Use   Smoking status: Former    Current packs/day: 0.00    Average packs/day: 1 pack/day for 36.9 years (36.9 ttl pk-yrs)    Types: Cigarettes    Start date: 07/22/1970    Quit date: 06/21/2007    Years since quitting: 16.3   Smokeless tobacco: Never  Vaping Use   Vaping  status: Never Used  Substance and Sexual Activity   Alcohol use: No    Alcohol/week: 0.0 standard drinks of alcohol   Drug use: No   Sexual activity: Not Currently    Birth control/protection: Post-menopausal  Other Topics Concern   Not on file  Social History Narrative   Not on file   Social Drivers of Health   Financial Resource Strain: Low Risk  (02/16/2023)   Overall Financial Resource Strain (CARDIA)    Difficulty of Paying Living Expenses: Not hard at all  Food Insecurity: No Food Insecurity (06/27/2023)   Hunger Vital Sign    Worried About Running Out of Food in the Last Year: Never true    Ran Out of Food in the Last Year: Never true  Transportation Needs: Patient Declined (03/24/2023)   PRAPARE - Transportation    Lack of Transportation (Medical): Patient declined    Lack of Transportation (Non-Medical): Patient declined  Physical Activity: Inactive (02/16/2023)   Exercise Vital Sign    Days of Exercise per Week: 0 days    Minutes of Exercise per Session: 0 min  Stress: No Stress Concern Present (02/16/2023)   [employer] of Occupational Health - Occupational Stress Questionnaire    Feeling of Stress : Not at all  Social Connections: Moderately Isolated (02/16/2023)   Social Connection and Isolation Panel [NHANES]    Frequency of Communication with  Friends and Family: More than three times a week    Frequency of Social Gatherings with Friends and Family: More than three times a week    Attends Religious Services: More than 4 times per year    Active Member of [employer] or Organizations: No    Attends Engineer, structural: Never    Marital Status: Never married    Review of Systems Per HPI  Objective:  BP 130/74   Pulse (!) 59   Ht 5\' 6"  (1.676 m)   Wt (!) 305 lb (138.3 kg)   SpO2 97%   BMI 49.23 kg/m      10/08/2023    1:26 PM 07/10/2023    1:16 PM 06/19/2023    9:52 AM  BP/Weight  Systolic BP 130 138 165  Diastolic BP 74 74 67  Wt. (Lbs) 305  294.2   BMI 49.23 kg/m2 47.49 kg/m2     Physical Exam Constitutional:      Appearance: Normal appearance. She is obese.  HENT:     Head: Normocephalic and atraumatic.  Eyes:     General:        Right eye: No discharge.        Left eye: No discharge.     Conjunctiva/sclera: Conjunctivae normal.  Cardiovascular:     Rate and Rhythm: Normal rate and regular rhythm.  Pulmonary:     Effort: Pulmonary effort is normal.     Breath sounds: Normal breath sounds. No wheezing or rales.  Neurological:     Mental Status: She is alert.     Lab Results  Component Value Date   WBC 6.0 03/27/2023   HGB 12.7 03/27/2023   HCT 40.0 03/27/2023   PLT 239 03/27/2023   GLUCOSE 305 (H) 03/27/2023   CHOL 140 12/15/2022   TRIG 122 12/15/2022   HDL 55 12/15/2022   LDLCALC 63 12/15/2022   ALT 17 03/27/2023   AST 26 03/27/2023   NA 134 (L) 03/27/2023   K 4.3 03/27/2023   CL 95 (L) 03/27/2023   CREATININE 0.81 03/27/2023   BUN 16 03/27/2023   CO2 28 03/27/2023   TSH 0.725 06/03/2021   HGBA1C 7.9 (H) 07/10/2023   MICROALBUR 2.53 (H) 09/22/2013     Assessment & Plan:  Essential hypertension Assessment & Plan: Stable.  Continue current medications.  Orders: -     Triamterene -HCTZ; Take 1 each (1 capsule total) by mouth daily.  Dispense: 90 capsule; Refill: 3 -     Metoprolol  Tartrate; Take 1 tablet (50 mg total) by mouth 2 (two) times daily.  Dispense: 180 tablet; Refill: 3  Pure hypercholesterolemia Assessment & Plan: Last LDL was 63.  Reassessing with lipid panel.  Orders: -     Lipid panel  Uncontrolled type 2 diabetes mellitus with hyperglycemia (HCC) Assessment & Plan: Uncontrolled.  Treatment options now limited due to intolerance of multiple medications.  She has not tolerated GLP-1 medication, SGLT2, metformin , DPP 4.  Will likely need insulin  therapy.  Would greatly benefit from a weight loss.  Obtaining A1c.  Continue glipizide .  Has upcoming endocrinology  appointment.  Orders: -     glipiZIDE ; Take 1 tablet (10 mg total) by mouth 2 (two) times daily before a meal.  Dispense: 180 tablet; Refill: 3 -     Hemoglobin A1c  Chronic left-sided low back pain without sciatica Assessment & Plan: Chronic.  Persistent.  Primary issue is morbid obesity.  Referring to PT.  Orders: -  Ambulatory referral to Physical Therapy  Arthritis of knee -     Ambulatory referral to Physical Therapy    Follow-up:  3-6 months.  Kathleen Papa DO [facility]

## 2023-10-08 NOTE — Patient Instructions (Signed)
 Labs later this week.  Referral for PT placed.  Follow up in 3-6 months.

## 2023-10-08 NOTE — Assessment & Plan Note (Signed)
 Chronic.  Persistent.  Primary issue is morbid obesity.  Referring to PT.

## 2023-10-08 NOTE — Assessment & Plan Note (Signed)
 Stable.  Continue current medications.

## 2023-10-12 DIAGNOSIS — E7800 Pure hypercholesterolemia, unspecified: Secondary | ICD-10-CM | POA: Diagnosis not present

## 2023-10-12 DIAGNOSIS — E1165 Type 2 diabetes mellitus with hyperglycemia: Secondary | ICD-10-CM | POA: Diagnosis not present

## 2023-10-13 LAB — LIPID PANEL
Chol/HDL Ratio: 3.1 ratio (ref 0.0–4.4)
Cholesterol, Total: 162 mg/dL (ref 100–199)
HDL: 52 mg/dL (ref >39)
LDL Chol Calc (NIH): 75 mg/dL (ref 0–99)
Triglycerides: 214 mg/dL — ABNORMAL HIGH (ref 0–149)
VLDL Cholesterol Cal: 35 mg/dL (ref 5–40)

## 2023-10-13 LAB — HEMOGLOBIN A1C
Est. average glucose Bld gHb Est-mCnc: 212 mg/dL
Hgb A1c MFr Bld: 9 % — ABNORMAL HIGH (ref 4.8–5.6)

## 2023-10-14 ENCOUNTER — Encounter: Payer: Self-pay | Admitting: Family Medicine

## 2023-11-10 LAB — AMB RESULTS CONSOLE CBG: Glucose: 238

## 2023-11-14 ENCOUNTER — Encounter (HOSPITAL_COMMUNITY): Payer: Self-pay

## 2023-11-14 ENCOUNTER — Other Ambulatory Visit: Payer: Self-pay

## 2023-11-14 ENCOUNTER — Ambulatory Visit (HOSPITAL_COMMUNITY): Attending: Family Medicine

## 2023-11-14 DIAGNOSIS — M545 Low back pain, unspecified: Secondary | ICD-10-CM | POA: Insufficient documentation

## 2023-11-14 DIAGNOSIS — R29898 Other symptoms and signs involving the musculoskeletal system: Secondary | ICD-10-CM | POA: Diagnosis not present

## 2023-11-14 DIAGNOSIS — G8929 Other chronic pain: Secondary | ICD-10-CM | POA: Insufficient documentation

## 2023-11-14 DIAGNOSIS — Z7409 Other reduced mobility: Secondary | ICD-10-CM | POA: Diagnosis not present

## 2023-11-14 DIAGNOSIS — M5459 Other low back pain: Secondary | ICD-10-CM | POA: Diagnosis not present

## 2023-11-14 DIAGNOSIS — M1710 Unilateral primary osteoarthritis, unspecified knee: Secondary | ICD-10-CM | POA: Diagnosis not present

## 2023-11-14 NOTE — Therapy (Signed)
 OUTPATIENT PHYSICAL THERAPY THORACOLUMBAR EVALUATION   Patient Name: Tracey Turner MRN: [id number] DOB:[birth year], 68 y.o., female Today's Date: 11/14/2023  END OF SESSION:  PT End of Session - 11/14/23 1729     Visit Number 1    Date for PT Re-Evaluation 12/26/23    Authorization Type [employer] MEDICARE    Authorization Time Period seeking auth    Authorization - Visit Number 0    Progress Note Due on Visit 10    PT Start Time 1600    PT Stop Time 1645    PT Time Calculation (min) 45 min    Activity Tolerance Patient tolerated treatment well;Patient limited by fatigue;Patient limited by pain    Behavior During Therapy [facility] for tasks assessed/performed             Past Medical History:  Diagnosis Date   Anemia    Arthritis    Back pain    Gastritis    Glaucoma    Hypertension    Knee pain    Type 2 diabetes mellitus (HCC)    Past Surgical History:  Procedure Laterality Date   COLONOSCOPY  02/13/2011   internal hemorrhoids   ESOPHAGOGASTRODUODENOSCOPY  02/13/2011   mild gatritis   GIVENS CAPSULE STUDY  03/28/2012       GIVENS CAPSULE STUDY  03/26/2012   Procedure: GIVENS CAPSULE STUDY;  Surgeon: Alyce Jubilee, MD;  Location: [facility];  Service: Endoscopy;  Laterality: N/A;  7:30   None     Patient Active Problem List   Diagnosis Date Noted   Low back pain 11/25/2021   Hyperlipidemia 04/15/2021   Varicose veins of both legs with edema 02/02/2020   Ocular hypertension 12/05/2019   GERD (gastroesophageal reflux disease) 06/15/2014   Morbid obesity (HCC) 12/18/2013   Uncontrolled type 2 diabetes mellitus with hyperglycemia (HCC) 09/12/2012   Essential hypertension 09/12/2012   Arthritis of knee 02/23/2011    PCP: Cook, Jayce G, DO  REFERRING PROVIDER: Cook, Jayce G, DO  REFERRING DIAG: [phone] (ICD-10-CM) - Chronic left-sided low back pain without sciatica M17.10 (ICD-10-CM) - Arthritis of knee  Rationale for Evaluation and Treatment:  Rehabilitation  THERAPY DIAG:  Other low back pain  Weakness of both lower extremities  Impaired functional mobility, balance, gait, and endurance  ONSET DATE:   SUBJECTIVE:                                                                                                                                                                                           SUBJECTIVE STATEMENT: Pt states she ruptured disc about 20 years ago while helping handicapped  brother move and make transfers. Pt states the fall on new years eve 2024 on the left side but that the right side is the side that has been bothering her. Pt states she tries to control diabetes with diet, does not like the metformin . Pt states legs feel weak and states she can tell since the fall she has slowed down considerably. Pt states she does have some right knee pain but her main concern is her back and moving faster. Pt would also like to maintain ability to work.   PERTINENT HISTORY:  -Ruptured disc 20 years ago -[name] new years eve 2024 while going into work -Arthritis causes ankle pain -Laser surgery on legs, 2-3 years ago, still goes  -Diabetes, seeing specialist Friday  PAIN:  Are you having pain? Yes: NPRS scale: 6/10, 8-9/10 at worst Pain location: center of low back, and RLE  Pain description: aching pain, sharp with certain movements Aggravating factors: bending, twisting, lifting heavy weights, walking for a long time Relieving factors: pain patch, rest, sitting down  PRECAUTIONS: Fall  RED FLAGS: None   WEIGHT BEARING RESTRICTIONS: No  FALLS:  Has patient fallen in last 6 months? Yes. 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  DIAGNOSTIC FINDINGS:  CLINICAL DATA:  [name] down steps landing on left side. Subsequent hip and lower back pain   EXAM: CT LUMBAR SPINE WITHOUT CONTRAST   TECHNIQUE: Multidetector CT imaging of the lumbar spine was performed without intravenous contrast administration. Multiplanar CT image reconstructions were also generated.   RADIATION DOSE REDUCTION: This exam was performed according to the departmental dose-optimization program which includes automated exposure control, adjustment of the mA and/or kV according to patient size and/or use of iterative reconstruction technique.   COMPARISON:  Lumbar spine radiographs 11/17/2011   FINDINGS: Segmentation: 5 lumbar type vertebrae.   Alignment: No evidence of traumatic listhesis.   Vertebrae: Demineralization. No acute fracture or focal pathologic process.   Paraspinal and other soft tissues: Aortic atherosclerotic calcification.   Disc levels: Multilevel spondylosis with bulky bridging anterior osteophytes, disc space height loss, and degenerative endplate changes greatest at L3-L4 and L4-L5. Ankylosis of L4-L5. Multilevel moderate facet arthropathy. Spinal canal narrowing is greatest at L3-L4 where it is moderate secondary to posterior disc osteophyte complex and ligamentum flavum hypertrophy. Neural foraminal narrowing is greatest on the right at L3-L4 heart is moderate to advanced and on the left at L4-L5 where it is advanced.   IMPRESSION: 1. No acute fracture or evidence of traumatic listhesis. 2. Multilevel spondylosis and facet arthropathy.  PATIENT SURVEYS:  Modified Oswestry 27/50   COGNITION: Overall cognitive status: Within functional limits for tasks assessed     SENSATION: Pt  states she has some neuropathy of both feet from diabetes, no numbness just doesn't feel as good as she used to.  POSTURE: rounded shoulders, forward head, and decreased lumbar lordosis  PALPATION: Pt demonstrates tenderness to palpation to right lumbar paraspinals and L1-L5 spinous processes.   LUMBAR ROM:   AROM eval  Flexion  80 degrees, pain in low back  Extension 7, little discomfort not as bad as going forward   Right lateral flexion Fingers to knee, pain in low back  Left lateral flexion Fingers to knee, pain in low back  Right rotation 25% available, no pain but limited  Left rotation 25% available, no pain but limited   (Blank rows = not tested)  LOWER EXTREMITY ROM:     Active  Right eval Left eval  Hip flexion    Hip extension    Hip abduction    Hip adduction    Hip internal rotation    Hip external rotation    Knee flexion    Knee extension    Ankle dorsiflexion    Ankle plantarflexion    Ankle inversion    Ankle eversion     (Blank rows = not tested)  LOWER EXTREMITY MMT:    MMT Right eval Left eval  Hip flexion 3 3  Hip extension 4 4  Hip abduction 3+ 3+  Hip adduction 3 3  Hip internal rotation    Hip external rotation    Knee flexion 3+ 3+  Knee extension 4 4  Ankle dorsiflexion 4- 4-  Ankle plantarflexion    Ankle inversion    Ankle eversion     (Blank rows = not tested)    FUNCTIONAL TESTS:  5 times sit to stand: 32.11 2 minute walk test: 152 ft  GAIT: Distance walked: about 250 feet throughout session Assistive device utilized: None Level of assistance: Complete Independence Comments: Decreased gait quality and velocity. Pt demonstrates decreased stance time on RLE as two minute walk test progresses, pt reports due to pain in RLE.  TREATMENT DATE:  11/14/2023   Evaluation: -ROM measured, Strength assessed, HEP prescribed, pt educated on prognosis, findings, and importance of HEP compliance if given.  Therapeutic  Exercise: -STS 9 reps throughout session -LTR, 1 set of 8 reps bilaterally                                                                                                                                   PATIENT EDUCATION:  Education details: Pt was educated on findings of PT evaluation, prognosis, frequency of therapy visits and rationale, attendance policy, and HEP if given.   Person educated: Patient Education method: Explanation, Verbal cues, and Handouts Education comprehension: verbalized understanding, verbal cues required, and needs further education  HOME EXERCISE PROGRAM: Access Code: [email or web address] URL: [email or web address] Date: 11/14/2023 Prepared by: Armond Bertin  Exercises - Supine Lower Trunk Rotation  - 1 x daily - 7 x weekly - 3 sets - 10 reps - Sit to Stand with Armchair  - 1 x daily - 7 x weekly - 3 sets - 10 reps  ASSESSMENT:  CLINICAL IMPRESSION: Patient is a 68 y.o. female who was seen today for physical therapy evaluation and treatment for M54.50,G89.29 (ICD-10-CM) - Chronic left-sided low back pain  without sciatica M17.10 (ICD-10-CM) - Arthritis of knee.   Patient demonstrates right sided low back pain, RLE pain with ambulation, decreased LE strength, and impaired balance. Patient also demonstrates difficulty with ambulation during today's session with decreased RLE stance time, BLE stride length and decreased velocity noted. Patient also demonstrates decreased ability to perform STS without BUE support at this time. Patient requires education on role of PT, POC, and various techniques involved with treatment of low back. Patient would benefit from skilled physical therapy for decreased low back pain, increased endurance with ambulation, increased LE strength, and balance for improved gait quality, return to higher level of function with ADLs, and progress towards therapy goals.   OBJECTIVE IMPAIRMENTS: Abnormal gait, decreased activity tolerance,  decreased balance, decreased endurance, decreased mobility, difficulty walking, decreased strength, impaired sensation, obesity, and pain.   ACTIVITY LIMITATIONS: carrying, lifting, bending, standing, squatting, stairs, transfers, bed mobility, and dressing  PARTICIPATION LIMITATIONS: meal prep, cleaning, laundry, driving, community activity, occupation, and yard work  PERSONAL FACTORS: Fitness, Past/current experiences, Time since onset of injury/illness/exacerbation, and 1-2 comorbidities: diabetes, working 10 hour shifts in which she is required to stand for 9/10 hours are also affecting patient's functional outcome.   REHAB POTENTIAL: Good  CLINICAL DECISION MAKING: Evolving/moderate complexity  EVALUATION COMPLEXITY: Low   GOALS: Goals reviewed with patient? No  SHORT TERM GOALS: Target date: 12/05/23  Pt will be independent with HEP in order to demonstrate participation in Physical Therapy POC.  Baseline: Goal status: INITIAL  2.  Pt will report 6/10 pain at worst with mobility in order to demonstrate improved pain with ADLs.  Baseline: 8-9/10 Goal status: INITIAL  LONG TERM GOALS: Target date: 12/26/23  Pt will improve 5TSTS by 2.3 seconds in order to demonstrate improved functional strength to return to desired activities.  Baseline: see objective.  Goal status: INITIAL  2.  Pt will improve 2 MWT by 140 in order to demonstrate improved functional ambulatory capacity in community setting.  Baseline: see objective.  Goal status: INITIAL  3.  Pt will improve Modified Oswestry score by 6 in order to demonstrate improved pain with functional goals and outcomes. Baseline: see objective.  Goal status: INITIAL  4.  Pt will report 4/10 pain with mobility in order to demonstrate reduced pain with ADLs lasting greater than 30 minutes.  Baseline: see objective.  Goal status: INITIAL   PLAN:  PT FREQUENCY: 2x/week  PT DURATION: 6 weeks  PLANNED INTERVENTIONS:  97110-Therapeutic exercises, 97530- Therapeutic activity, 97112- Neuromuscular re-education, 97535- Self Care, 16109- Manual therapy, [phone]- Gait training, Patient/Family education, Balance training, Stair training, Joint mobilization, Spinal mobilization, Manual lymph drainage, DME instructions, Cryotherapy, and Moist heat.  PLAN FOR NEXT SESSION: review HEP and goals, progress HEP, encourage walking program, progress core and LE strengthening as well as endurance training.   Armond Bertin, PT, DPT [facility] Office: [phone] 5:36 PM, 11/14/23  [facility] Medicare Auth Request Information  Date of referral: 10/08/23 Referring provider: Cook, Jayce G, DO Referring diagnosis (ICD 10)? M54.50,G89.29 (ICD-10-CM) - Chronic left-sided low back pain without sciatica M17.10 (ICD-10-CM) - Arthritis of knee Treatment diagnosis (ICD 10)? (if different than referring diagnosis) M54.59 ; R29.898 ; Z74.09    Functional Tool Score: Modified Oswestry 27/50   What was this (referring dx) caused by? Fall and Ongoing Issue  Lonne Roan of Condition: Chronic (continuous duration > 3 months)   Laterality: Rt  Current Functional Measure Score: Modified Oswestry 27/50   Objective measurements identify impairments  when they are compared to normal values, the uninvolved extremity, and prior level of function.  [x]  Yes  []  No  Objective assessment of functional ability: Moderate functional limitations   Briefly describe symptoms: low back pain, decreased LE strength, decreased ambulation speed  How did symptoms start: history of disc herniation, fall  Average pain intensity:  Last 24 hours: 6/10  Past week: 8/10  How often does the pt experience symptoms? Frequently  How much have the symptoms interfered with usual daily activities? Moderately  How has condition changed since care began at this facility? NA - initial visit  In general, how is the patients overall health?  Fair   BACK PAIN (STarT Back Screening Tool) Has pain spread down the leg(s) at some time in the last 2 weeks? yes Has there been pain in the shoulder or neck at some time in the last 2 weeks? no Has the pt only walked short distances because of back pain? yes Has patient dressed more slowly because of back pain in the past 2 weeks? yes Does patient think it's not safe for a person with this condition to be physically active? no Does patient have worrying thoughts a lot of the time? no Does patient feel back pain is terrible and will never get any better? no Has patient stopped enjoying things they usually enjoy? yes

## 2023-11-16 ENCOUNTER — Encounter: Payer: Self-pay | Admitting: Endocrinology

## 2023-11-16 ENCOUNTER — Ambulatory Visit: Admitting: Endocrinology

## 2023-11-16 ENCOUNTER — Telehealth: Payer: Self-pay

## 2023-11-16 VITALS — BP 136/80 | HR 70 | Resp 20 | Ht 66.0 in | Wt 306.6 lb

## 2023-11-16 DIAGNOSIS — E1169 Type 2 diabetes mellitus with other specified complication: Secondary | ICD-10-CM | POA: Diagnosis not present

## 2023-11-16 DIAGNOSIS — Z7985 Long-term (current) use of injectable non-insulin antidiabetic drugs: Secondary | ICD-10-CM

## 2023-11-16 DIAGNOSIS — Z7984 Long term (current) use of oral hypoglycemic drugs: Secondary | ICD-10-CM | POA: Diagnosis not present

## 2023-11-16 MED ORDER — BLOOD GLUCOSE MONITORING SUPPL DEVI: 1.0000 | Freq: Every day | 0 refills | Status: AC

## 2023-11-16 MED ORDER — LANCETS MISC. MISC
1.0000 | Freq: Every day | 3 refills | Status: AC
Start: 2023-11-16 — End: 2023-12-16

## 2023-11-16 MED ORDER — LANCET DEVICE MISC
1.0000 | Freq: Every day | 0 refills | Status: AC
Start: 2023-11-16 — End: 2024-11-15

## 2023-11-16 MED ORDER — SEMAGLUTIDE(0.25 OR 0.5MG/DOS) 2 MG/3ML ~~LOC~~ SOPN: PEN_INJECTOR | SUBCUTANEOUS | 4 refills | Status: DC

## 2023-11-16 MED ORDER — BLOOD GLUCOSE TEST VI STRP: 1.0000 | ORAL_STRIP | Freq: Every day | 3 refills | Status: AC

## 2023-11-16 NOTE — Progress Notes (Signed)
 Outpatient Endocrinology Note [place] Aldena Worm, MD   Patient's Name: Tracey Turner    DOB: [birth year]    MRN: [id number]                                                    REASON OF VISIT: New consult for type 2 diabetes mellitus  REFERRING PROVIDER: Cook, Jayce G, DO  PCP: Cook, Jayce G, DO  HISTORY OF PRESENT ILLNESS:   Tracey Turner is a 68 y.o. old female with past medical history listed below, is here for new consult for type 2 diabetes mellitus.   Pertinent Diabetes History: Patient is referred for evaluation and management of uncontrolled type 2 diabetes mellitus.  Initial consult on Nov 16, 2023.  Patient was diagnosed with type 2 diabetes mellitus in the timeframe of 2009/2010.  She has relatively acceptable control of diabetes mellitus in late 2021 with hemoglobin A1c in the range of 7%.  Starting 2022 she has worsening diabetes control with hemoglobin A1c in the range of 8 to 9%.  She has not been on insulin  therapy.  History of DKA or diabetes related hospitalizations: none  Previous diabetes education: Yes in the remote past.  Family h/o diabetes mellitus: type 2 DM in mother and brothers.    No personal history of pancreatitis and / or family history of medullary thyroid  carcinoma or MEN 2B syndrome.   Patient reports she had taken Victoza, Trulicity , Jardiance , Januvia  in the past as mentioned below were stopped due to side effects.  Chronic Diabetes Complications : Retinopathy: no. Last ophthalmology exam was done on annually, following with ophthalmology regularly.  Nephropathy: no Peripheral neuropathy: no Coronary artery disease: no Stroke: on  Relevant comorbidities and cardiovascular risk factors: Obesity: yes Body mass index is 49.49 kg/m.  Hypertension: Yes  Hyperlipidemia : Yes, not on statin   Current / Home Diabetic regimen includes:  Glipizide  10 mg two times a day.   Prior diabetic medications: Metformin  : Stopped due to GI intolerance.   Jardiance   / Javuvia : Stopped due to muscle and joint pain.  Trulicity  / Victoza caused tires and weak. Stopped few years ago.    Glycemic data:   No glucose data to review.  She has not been checking blood sugar.  She reports she had tried CGM in the past however not covered by medical insurance.  Hypoglycemia: Patient has no hypoglycemic episodes. Patient has hypoglycemia awareness.  Factors modifying glucose control: 1.  Diabetic diet assessment: 2-3 meals a day.  Occasional regular sodas.  2.  Staying active or exercising:   3.  Medication compliance: compliant all of the time.  Interval history  Patient presented to establish endocrinology care for known uncontrolled type 2 diabetes mellitus.  Hemoglobin A1c was 9% in April 2025.  She denies complaints of numbness and ting of the feet.  She reports she has lymphedema and following with vascular surgery.  She denies venous problem.  She reports she had diabetic eye exam recently and does annually and denies diabetic retinopathy.  REVIEW OF SYSTEMS As per history of present illness.   PAST MEDICAL HISTORY: Past Medical History:  Diagnosis Date   Anemia    Arthritis    Back pain    Gastritis    Glaucoma    Hypertension    Knee pain  Type 2 diabetes mellitus (HCC)     PAST SURGICAL HISTORY: Past Surgical History:  Procedure Laterality Date   COLONOSCOPY  02/13/2011   internal hemorrhoids   ESOPHAGOGASTRODUODENOSCOPY  02/13/2011   mild gatritis   GIVENS CAPSULE STUDY  03/28/2012       GIVENS CAPSULE STUDY  03/26/2012   Procedure: GIVENS CAPSULE STUDY;  Surgeon: Alyce Jubilee, MD;  Location: [facility];  Service: Endoscopy;  Laterality: N/A;  7:30   None      ALLERGIES: Allergies  Allergen Reactions   Ace Inhibitors Shortness Of Breath, Swelling, Anaphylaxis and Other (See Comments)   Penicillins Anaphylaxis   Protonix  [Pantoprazole ] Diarrhea and Itching    Diarrhea; can take Prevacid  without difficulty    Norvasc  [Amlodipine ] Other (See Comments)    Lower leg edema, worsening   Amoxicillin     Nausea/aggravates gastritis   Cholecalciferol Other (See Comments)    Other Reaction(s): Other (See Comments)   Empagliflozin  Other (See Comments)    Joint pain   Januvia  [Sitagliptin ] Other (See Comments)    Joint pain   Metformin  And Related Diarrhea   Nexium [Esomeprazole Magnesium] Diarrhea   Nsaids Other (See Comments)    Gastritis, sharp decrease in iron   Omeprazole  Other (See Comments)    diarrhea   Prevacid  [Lansoprazole ]     diarrhea   Victoza [Liraglutide] Other (See Comments)    "out of it" while taking med    FAMILY HISTORY:  Family History  Problem Relation Age of Onset   Prostate cancer Father    Stroke Father    Hypertension Father    Diabetes Mother    Heart disease Mother    Hypertension Mother    Heart disease Sister    Asthma Sister    Kidney disease Brother    Diabetes Brother    Hypertension Brother    Heart disease Brother    Diabetes Brother    Heart disease Brother    Diabetes Brother    Heart disease Brother    Heart disease Unknown    Arthritis Unknown    Cancer Unknown    Diabetes Unknown    Kidney disease Unknown    Colon cancer Neg Hx    Colon polyps Neg Hx     SOCIAL HISTORY: Social History   Socioeconomic History   Marital status: Single    Spouse name: Not on file   Number of children: Not on file   Years of education: some coll.   Highest education level: Not on file  Occupational History   Occupation: [employer] [place] [place]    Employer: [employer]  Tobacco Use   Smoking status: Former    Current packs/day: 0.00    Average packs/day: 1 pack/day for 36.9 years (36.9 ttl pk-yrs)    Types: Cigarettes    Start date: 07/22/1970    Quit date: 06/21/2007    Years since quitting: 16.4   Smokeless tobacco: Never  Vaping Use   Vaping status: Never Used  Substance and Sexual Activity   Alcohol use: No    Alcohol/week: 0.0 standard drinks of  alcohol   Drug use: No   Sexual activity: Not Currently    Birth control/protection: Post-menopausal  Other Topics Concern   Not on file  Social History Narrative   Not on file   Social Drivers of Health   Financial Resource Strain: Low Risk  (02/16/2023)   Overall Financial Resource Strain (CARDIA)    Difficulty of Paying Living Expenses:  Not hard at all  Food Insecurity: No Food Insecurity (11/10/2023)   Hunger Vital Sign    Worried About Running Out of Food in the Last Year: Never true    Ran Out of Food in the Last Year: Never true  Transportation Needs: No Transportation Needs (11/10/2023)   PRAPARE - Administrator, Civil Service (Medical): No    Lack of Transportation (Non-Medical): No  Physical Activity: Inactive (02/16/2023)   Exercise Vital Sign    Days of Exercise per Week: 0 days    Minutes of Exercise per Session: 0 min  Stress: No Stress Concern Present (02/16/2023)   [employer] of Occupational Health - Occupational Stress Questionnaire    Feeling of Stress : Not at all  Social Connections: Moderately Isolated (02/16/2023)   Social Connection and Isolation Panel [NHANES]    Frequency of Communication with Friends and Family: More than three times a week    Frequency of Social Gatherings with Friends and Family: More than three times a week    Attends Religious Services: More than 4 times per year    Active Member of [employer] or Organizations: No    Attends Banker Meetings: Never    Marital Status: Never married    MEDICATIONS:  Current Outpatient Medications  Medication Sig Dispense Refill   acetaminophen  (TYLENOL ) 650 MG CR tablet Take 650 mg by mouth every 8 (eight) hours as needed. arthritis     albuterol  (VENTOLIN  HFA) 108 (90 Base) MCG/ACT inhaler INHALE TWO PUFFS EVERY 6 HOURS AS NEEDED FOR WHEEZING OR SHORTNESS OF BREATH 8.5 g 0   Blood Glucose Monitoring Suppl DEVI 1 each by Does not apply route daily. May substitute to any  manufacturer covered by patient's insurance. 1 each 0   Calcium  Carbonate-Vitamin D (CALCIUM  + D PO) Take by mouth daily.      cetirizine (ZYRTEC) 10 MG tablet Take 10 mg by mouth daily.     cyclobenzaprine  (FLEXERIL ) 10 MG tablet Take 1 tablet (10 mg total) by mouth 3 (three) times daily as needed for muscle spasms. Muscle spasms 90 tablet 1   ferrous sulfate  dried (SLOW FE) 160 (50 FE) MG TBCR Take 160 mg by mouth 3 (three) times daily with meals.     fluticasone  (FLONASE ) 50 MCG/ACT nasal spray Place 2 sprays into both nostrils daily. 48 g 5   furosemide  (LASIX ) 20 MG tablet TAKE ONE TABLET DAILY FOR EDEMA/FLUID 90 tablet 1   gabapentin  (NEURONTIN ) 100 MG capsule Take 1 capsule (100 mg total) by mouth 2 (two) times daily. 180 capsule 3   glipiZIDE  (GLUCOTROL ) 10 MG tablet Take 1 tablet (10 mg total) by mouth 2 (two) times daily before a meal. 180 tablet 3   Glucose Blood (BLOOD GLUCOSE TEST STRIPS) STRP 1 each by In Vitro route daily. May substitute to any manufacturer covered by patient's insurance. 100 each 3   hydroquinone  4 % cream Apply 1 Application topically 2 (two) times daily as needed (dark patches). 28.35 g 1   Lancet Device MISC 1 each by Does not apply route daily. May substitute to any manufacturer covered by patient's insurance. 1 each 0   Lancets Misc. MISC 1 each by Does not apply route daily. May substitute to any manufacturer covered by patient's insurance. 100 each 3   latanoprost  (XALATAN ) 0.005 % ophthalmic solution INSTILL 1 DROP IN EACH EYE  AT BEDTIME 7.5 mL 5   lidocaine  (LIDODERM ) 5 % APPLY 1 PATCH ONCE  A DAY FOR 12 HOURS ON AND 12 HOURS OFF 30 patch 0   metoprolol  tartrate (LOPRESSOR ) 50 MG tablet Take 1 tablet (50 mg total) by mouth 2 (two) times daily. 180 tablet 3   Multiple Vitamin (MULTIVITAMIN WITH MINERALS) TABS Take 1 tablet by mouth daily.     traMADol  (ULTRAM ) 50 MG tablet Take 1 tablet (50 mg total) by mouth every 12 (twelve) hours as needed for moderate  pain (pain score 4-6) or severe pain (pain score 7-10). 60 tablet 5   triamterene -hydrochlorothiazide  (DYAZIDE ) 37.5-25 MG capsule Take 1 each (1 capsule total) by mouth daily. 90 capsule 3   Semaglutide ,0.25 or 0.5MG /DOS, 2 MG/3ML SOPN Inject 0.25 mg into the skin once a week for 4 weeks and increase to 0.5 mg weekly. 3 mL 4   No current facility-administered medications for this visit.    PHYSICAL EXAM: Vitals:   11/16/23 0922 11/16/23 0923  BP: (!) 150/90 136/80  Pulse: 70   Resp: 20   SpO2: 96%   Weight: (!) 306 lb 9.6 oz (139.1 kg)   Height: 5\' 6"  (1.676 m)    Body mass index is 49.49 kg/m.  Wt Readings from Last 3 Encounters:  11/16/23 (!) 306 lb 9.6 oz (139.1 kg)  10/08/23 (!) 305 lb (138.3 kg)  07/10/23 294 lb 3.2 oz (133.4 kg)    General: Well developed, well nourished female in no apparent distress.  HEENT: AT/[place], no external lesions.  Eyes: Conjunctiva clear and no icterus. Neck: Neck supple  Lungs: Respirations not labored Neurologic: Alert, oriented, normal speech Extremities / Skin: Dry. Pedal edema + Psychiatric: Does not appear depressed or anxious   Diabetic Foot Exam - Simple   No data filed     LABS Reviewed Lab Results  Component Value Date   HGBA1C 9.0 (H) 10/12/2023   HGBA1C 7.9 (H) 07/10/2023   HGBA1C 8.5 (H) 12/15/2022   No results found for: "FRUCTOSAMINE" Lab Results  Component Value Date   CHOL 162 10/12/2023   HDL 52 10/12/2023   LDLCALC 75 10/12/2023   TRIG 214 (H) 10/12/2023   CHOLHDL 3.1 10/12/2023   Lab Results  Component Value Date   MICRALBCREAT 17 07/10/2023   MICRALBCREAT 49 (H) 12/15/2022   Lab Results  Component Value Date   CREATININE 0.81 03/27/2023   No results found for: "GFR"  ASSESSMENT / PLAN  1. Type 2 diabetes mellitus with other specified complication, without long-term current use of insulin  (HCC)     Diabetes Mellitus type 2, complicated by no known complications. - Diabetic status / severity:  Uncontrolled.  Lab Results  Component Value Date   HGBA1C 9.0 (H) 10/12/2023    - Hemoglobin A1c goal : <6.5% Discussed about type 2 diabetes mellitus and potential chronic diabetic complications including diabetic retinopathy, neuropathy and nephropathy.  Discussed about importance of controlled blood sugar to prevent complications.  She needs additional diabetic medication.  Does not necessarily need insulin  therapy at this time.  Due to previous side effect has limited options for antidiabetic medications.  Discussed in detail about diet control, limiting carbohydrate and portion control and avoiding snacks and also asked to avoid regular sodas.  Discussed about increasing physical activity and routinely scheduled exercise.  She had taken Victoza and Trulicity  few years ago, reports complaints of tiredness and weakness, denies sickness, GI upset.  Discussed about trying GLP-1 receptor agonist, she agreed.  She has no known contraindication for GLP-1 receptor agonist at this time.  - Medications:  See below.  I) continue glipizide  10 mg 2 times a day. II) start Ozempic  0.25 mg weekly for 4 weeks then increase to 0.5 mg weekly.  Will titrate up Ozempic  dose in slow fashion.  - Home glucose testing: Advised to check blood sugar in the morning fasting and occasionally at bedtime, sent prescription for test supplies.  - Discussed/ Gave Hypoglycemia treatment plan.  # Consult : Refer to diabetic educator for diet plan.  # Annual urine for microalbuminuria/ creatinine ratio, no microalbuminuria currently. Last  Lab Results  Component Value Date   MICRALBCREAT 17 07/10/2023    # Foot check nightly.  # Annual dilated diabetic eye exams.   - Diet: Make healthy diabetic food choices - Life style / activity / exercise: Discussed  2. Blood pressure  -  BP Readings from Last 1 Encounters:  11/16/23 136/80    - Control is in target.  - No change in current plans.  3. Lipid  status / Hyperlipidemia - Last  Lab Results  Component Value Date   LDLCALC 75 10/12/2023   - Currently not on a statin.  Patient reports she had muscle ache and joint pain from prior two statin therapy.  Diagnoses and all orders for this visit:  Type 2 diabetes mellitus with other specified complication, without long-term current use of insulin  (HCC) -     Semaglutide ,0.25 or 0.5MG /DOS, 2 MG/3ML SOPN; Inject 0.25 mg into the skin once a week for 4 weeks and increase to 0.5 mg weekly. -     Amb Referral to Nutrition and Diabetic Education -     Blood Glucose Monitoring Suppl DEVI; 1 each by Does not apply route daily. May substitute to any manufacturer covered by patient's insurance. -     Glucose Blood (BLOOD GLUCOSE TEST STRIPS) STRP; 1 each by In Vitro route daily. May substitute to any manufacturer covered by patient's insurance. -     Lancet Device MISC; 1 each by Does not apply route daily. May substitute to any manufacturer covered by patient's insurance. -     Lancets Misc. MISC; 1 each by Does not apply route daily. May substitute to any manufacturer covered by patient's insurance.    DISPOSITION Follow up in clinic in 3  months suggested.   All questions answered and patient verbalized understanding of the plan.  [place] Gagan Dillion, MD [facility] Endocrinology [facility] [address] [place], [address] [place], [place] [address] Phone # [phone]  At least part of this note was generated using voice recognition software. Inadvertent word errors may have occurred, which were not recognized during the proofreading process.

## 2023-11-16 NOTE — Telephone Encounter (Signed)
 Pharmacy Patient Advocate Encounter   Received notification from CoverMyMeds that prior authorization for Ozempic  is required/requested.   Insurance verification completed.   The patient is insured through [facility] .   Per test claim: PA required; PA submitted to above mentioned insurance via CoverMyMeds Key/confirmation #/EOC [id number] Status is pending

## 2023-11-16 NOTE — Patient Instructions (Signed)
 Start ozempic  0.25mg  weekly for 4 weeks and increase to 0.5 mg weekly.

## 2023-11-19 ENCOUNTER — Ambulatory Visit (HOSPITAL_COMMUNITY): Attending: Family Medicine

## 2023-11-19 ENCOUNTER — Encounter (HOSPITAL_COMMUNITY): Payer: Self-pay

## 2023-11-19 DIAGNOSIS — M5459 Other low back pain: Secondary | ICD-10-CM | POA: Insufficient documentation

## 2023-11-19 DIAGNOSIS — Z7409 Other reduced mobility: Secondary | ICD-10-CM | POA: Insufficient documentation

## 2023-11-19 DIAGNOSIS — R29898 Other symptoms and signs involving the musculoskeletal system: Secondary | ICD-10-CM | POA: Insufficient documentation

## 2023-11-19 NOTE — Therapy (Signed)
 OUTPATIENT PHYSICAL THERAPY THORACOLUMBAR EVALUATION   Patient Name: Tracey Turner MRN: [id number] DOB:29-Mar-1956, 68 y.o., female Today's Date: 11/19/2023  END OF SESSION:  PT End of Session - 11/19/23 1649     Visit Number 2    Number of Visits 12    Date for PT Re-Evaluation 12/26/23    Authorization Type [employer] MEDICARE    Authorization Time Period seeking auth    Progress Note Due on Visit 10    PT Start Time 1647    PT Stop Time 1728    PT Time Calculation (min) 41 min    Activity Tolerance Patient tolerated treatment well;Patient limited by pain    Behavior During Therapy [facility] for tasks assessed/performed             Past Medical History:  Diagnosis Date   Anemia    Arthritis    Back pain    Gastritis    Glaucoma    Hypertension    Knee pain    Type 2 diabetes mellitus (HCC)    Past Surgical History:  Procedure Laterality Date   COLONOSCOPY  02/13/2011   internal hemorrhoids   ESOPHAGOGASTRODUODENOSCOPY  02/13/2011   mild gatritis   GIVENS CAPSULE STUDY  03/28/2012       GIVENS CAPSULE STUDY  03/26/2012   Procedure: GIVENS CAPSULE STUDY;  Surgeon: Tracey Jubilee, MD;  Location: [facility];  Service: Endoscopy;  Laterality: N/A;  7:30   None     Patient Active Problem List   Diagnosis Date Noted   Low back pain 11/25/2021   Hyperlipidemia 04/15/2021   Varicose veins of both legs with edema 02/02/2020   Ocular hypertension 12/05/2019   GERD (gastroesophageal reflux disease) 06/15/2014   Morbid obesity (HCC) 12/18/2013   Uncontrolled type 2 diabetes mellitus with hyperglycemia (HCC) 09/12/2012   Essential hypertension 09/12/2012   Arthritis of knee 02/23/2011    PCP: Cook, Jayce G, DO  REFERRING PROVIDER: Cook, Jayce G, DO  REFERRING DIAG: [phone] (ICD-10-CM) - Chronic left-sided low back pain without sciatica M17.10 (ICD-10-CM) - Arthritis of knee  Rationale for Evaluation and Treatment: Rehabilitation  THERAPY DIAG:  Other low back  pain  Impaired functional mobility, balance, gait, and endurance  Weakness of both lower extremities  ONSET DATE:   SUBJECTIVE:                                                                                                                                                                                           SUBJECTIVE STATEMENT: Pt reports about 6-7/10 pain on this date. Reporting she doesn't feel the best today. Reports  HEP has been okay at home. RLE pain still comes and goes since last visit.   Pt states she ruptured disc about 20 years ago while helping handicapped brother move and make transfers. Pt states the fall on new years eve 2024 on the left side but that the right side is the side that has been bothering her. Pt states she tries to control diabetes with diet, does not like the metformin . Pt states legs feel weak and states she can tell since the fall she has slowed down considerably. Pt states she does have some right knee pain but her main concern is her back and moving faster. Pt would also like to maintain ability to work.   PERTINENT HISTORY:  -Ruptured disc 20 years ago -Tracey Turner new years eve 2024 while going into work -Arthritis causes ankle pain -Laser surgery on legs, 2-3 years ago, still goes  -Diabetes, seeing specialist Friday  PAIN:  Are you having pain? Yes: NPRS scale: 6/10, 8-9/10 at worst Pain location: center of low back, and RLE  Pain description: aching pain, sharp with certain movements Aggravating factors: bending, twisting, lifting heavy weights, walking for a long time Relieving factors: pain patch, rest, sitting down  PRECAUTIONS: Fall  RED FLAGS: None   WEIGHT BEARING RESTRICTIONS: No  FALLS:  Has patient fallen in last 6 months? Yes. 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  DIAGNOSTIC FINDINGS:  CLINICAL DATA:  Tracey Turner down steps landing on left side. Subsequent hip and lower back pain   EXAM: CT LUMBAR SPINE WITHOUT CONTRAST   TECHNIQUE: Multidetector CT imaging of the lumbar spine was performed without intravenous contrast administration. Multiplanar CT image reconstructions were also generated.   RADIATION DOSE REDUCTION: This exam was performed according to the departmental dose-optimization program which includes automated exposure control, adjustment of the mA and/or kV according to patient size and/or use of iterative reconstruction technique.   COMPARISON:  Lumbar spine radiographs 11/17/2011   FINDINGS: Segmentation: 5 lumbar type vertebrae.   Alignment: No evidence of traumatic listhesis.   Vertebrae: Demineralization. No acute fracture or focal pathologic process.   Paraspinal and other soft tissues: Aortic atherosclerotic calcification.   Disc levels: Multilevel spondylosis with bulky bridging anterior osteophytes, disc space height loss, and degenerative endplate changes greatest at L3-L4 and L4-L5. Ankylosis of L4-L5. Multilevel moderate facet arthropathy. Spinal canal narrowing is greatest at L3-L4 where it is moderate secondary to posterior disc osteophyte complex and ligamentum flavum hypertrophy. Neural foraminal narrowing is greatest on the right at L3-L4 heart is moderate to advanced and on the left at L4-L5 where it is advanced.   IMPRESSION: 1. No acute fracture or evidence of traumatic listhesis. 2. Multilevel spondylosis and facet arthropathy.  PATIENT SURVEYS:  Modified Oswestry  27/50   COGNITION: Overall cognitive status: Within functional limits for tasks assessed     SENSATION: Pt states she has some neuropathy of both feet from diabetes, no numbness just doesn't feel as good as she used to.  POSTURE: rounded shoulders, forward head,  and decreased lumbar lordosis  PALPATION: Pt demonstrates tenderness to palpation to right lumbar paraspinals and L1-L5 spinous processes.   LUMBAR ROM:   AROM eval  Flexion 80 degrees, pain in low back  Extension 7, little discomfort not as bad as going forward   Right lateral flexion Fingers to knee, pain in low back  Left lateral flexion Fingers to knee, pain in low back  Right rotation 25% available, no pain but limited  Left rotation 25% available, no pain but limited   (Blank rows = not tested)  LOWER EXTREMITY ROM:     Active  Right eval Left eval  Hip flexion    Hip extension    Hip abduction    Hip adduction    Hip internal rotation    Hip external rotation    Knee flexion    Knee extension    Ankle dorsiflexion    Ankle plantarflexion    Ankle inversion    Ankle eversion     (Blank rows = not tested)  LOWER EXTREMITY MMT:    MMT Right eval Left eval  Hip flexion 3 3  Hip extension 4 4  Hip abduction 3+ 3+  Hip adduction 3 3  Hip internal rotation    Hip external rotation    Knee flexion 3+ 3+  Knee extension 4 4  Ankle dorsiflexion 4- 4-  Ankle plantarflexion    Ankle inversion    Ankle eversion     (Blank rows = not tested)    FUNCTIONAL TESTS:  5 times sit to stand: 32.11 2 minute walk test: 152 ft  GAIT: Distance walked: about 250 feet throughout session Assistive device utilized: None Level of assistance: Complete Independence Comments: Decreased gait quality and velocity. Pt demonstrates decreased stance time on RLE as two minute walk test progresses, pt reports due to pain in RLE.  TREATMENT DATE:  11/19/23: Review of HEP and goals  STS, UE use, 2x10 Seated  Piriformis, 15" holds, RLE only, 3x, assist of towel LTR, 10x ea side SKTC, 10x, assist of towel  PPT in hook-lying position, 5" holds, 2x10 TA contraction in hook-lying position, 5" holds, 10x NuStep, Seat 11, 5', level 1, spm over 75   11/14/2023  Evaluation: -ROM measured, Strength assessed, HEP prescribed, pt educated on prognosis, findings, and importance of HEP compliance if given.  Therapeutic Exercise: -STS 9 reps throughout session -LTR, 1 set of 8 reps bilaterally                                                                                                                                   PATIENT EDUCATION:  Education details: Pt was educated on findings of PT evaluation, prognosis, frequency of therapy visits and rationale, attendance policy, and HEP if given.   Person educated: Patient Education method: Explanation, Verbal cues, and Handouts Education comprehension: verbalized understanding, verbal cues required, and needs further education  HOME EXERCISE  PROGRAM: Access Code: [email or web address] URL: [email or web address] Date: 11/14/2023 Prepared by: Armond Bertin  Exercises - Supine Lower Trunk Rotation  - 1 x daily - 7 x weekly - 3 sets - 10 reps - Sit to Stand with Armchair  - 1 x daily - 7 x weekly - 3 sets - 10 reps  ASSESSMENT:  CLINICAL IMPRESSION: Review of goals and HEP to start the session. Patient report compliance with HEP, demonstrating good carryover with performance in session. Addition of seated piriformis stretch, TA contraction, and posterior pelvic tilt to HEP, with patient reporting only stretch sensation in R low back. Pt required min A for supine to sit secondary to pain with transitional movement on this day. Ended session with patient on NuStep for general spinal mobility. Patient tolerated session well. Encouraged/educated patient on possible [employer] membership to use amenities such as pool to help with her joint pain, walking program, and  increase overall activity level along with socialization. Patient will continue to benefit from skilled physical therapy in order to address strength, endurance, and pain in order to improve QOL and function.     EVAL: Patient is a 68 y.o. female who was seen today for physical therapy evaluation and treatment for M54.50,G89.29 (ICD-10-CM) - Chronic left-sided low back pain without sciatica M17.10 (ICD-10-CM) - Arthritis of knee.  Patient demonstrates right sided low back pain, RLE pain with ambulation, decreased LE strength, and impaired balance. Patient also demonstrates difficulty with ambulation during today's session with decreased RLE stance time, BLE stride length and decreased velocity noted. Patient also demonstrates decreased ability to perform STS without BUE support at this time. Patient requires education on role of PT, POC, and various techniques involved with treatment of low back. Patient would benefit from skilled physical therapy for decreased low back pain, increased endurance with ambulation, increased LE strength, and balance for improved gait quality, return to higher level of function with ADLs, and progress towards therapy goals.   OBJECTIVE IMPAIRMENTS: Abnormal gait, decreased activity tolerance, decreased balance, decreased endurance, decreased mobility, difficulty walking, decreased strength, impaired sensation, obesity, and pain.   ACTIVITY LIMITATIONS: carrying, lifting, bending, standing, squatting, stairs, transfers, bed mobility, and dressing  PARTICIPATION LIMITATIONS: meal prep, cleaning, laundry, driving, community activity, occupation, and yard work  PERSONAL FACTORS: Fitness, Past/current experiences, Time since onset of injury/illness/exacerbation, and 1-2 comorbidities: diabetes, working 10 hour shifts in which she is required to stand for 9/10 hours are also affecting patient's functional outcome.   REHAB POTENTIAL: Good  CLINICAL DECISION MAKING:  Evolving/moderate complexity  EVALUATION COMPLEXITY: Low   GOALS: Goals reviewed with patient? Yes  SHORT TERM GOALS: Target date: 12/05/23  Pt will be independent with HEP in order to demonstrate participation in Physical Therapy POC.  Baseline: Goal status: INITIAL  2.  Pt will report 6/10 pain at worst with mobility in order to demonstrate improved pain with ADLs.  Baseline: 8-9/10 Goal status: INITIAL  LONG TERM GOALS: Target date: 12/26/23  Pt will improve 5TSTS by 2.3 seconds in order to demonstrate improved functional strength to return to desired activities.  Baseline: see objective.  Goal status: INITIAL  2.  Pt will improve 2 MWT by 140 in order to demonstrate improved functional ambulatory capacity in community setting.  Baseline: see objective.  Goal status: INITIAL  3.  Pt will improve Modified Oswestry score by 6 in order to demonstrate improved pain with functional goals and outcomes. Baseline: see objective.  Goal status: INITIAL  4.  Pt will report 4/10  pain with mobility in order to demonstrate reduced pain with ADLs lasting greater than 30 minutes.  Baseline: see objective.  Goal status: INITIAL   PLAN:  PT FREQUENCY: 2x/week  PT DURATION: 6 weeks  PLANNED INTERVENTIONS: 97110-Therapeutic exercises, 97530- Therapeutic activity, 97112- Neuromuscular re-education, 97535- Self Care, 16109- Manual therapy, [phone]- Gait training, Patient/Family education, Balance training, Stair training, Joint mobilization, Spinal mobilization, Manual lymph drainage, DME instructions, Cryotherapy, and Moist heat.  PLAN FOR NEXT SESSION: progress HEP, encourage walking program, progress core and LE strengthening as well as endurance training.   5:43 PM, 11/19/23 Marysue Sola, PT, DPT [facility] [place]

## 2023-11-23 ENCOUNTER — Ambulatory Visit: Admitting: Nurse Practitioner

## 2023-11-23 VITALS — BP 149/80 | HR 61 | Temp 98.2°F | Ht 66.0 in | Wt 305.2 lb

## 2023-11-23 DIAGNOSIS — I10 Essential (primary) hypertension: Secondary | ICD-10-CM | POA: Diagnosis not present

## 2023-11-23 DIAGNOSIS — G8929 Other chronic pain: Secondary | ICD-10-CM

## 2023-11-23 DIAGNOSIS — E1165 Type 2 diabetes mellitus with hyperglycemia: Secondary | ICD-10-CM

## 2023-11-23 DIAGNOSIS — M545 Low back pain, unspecified: Secondary | ICD-10-CM | POA: Diagnosis not present

## 2023-11-23 DIAGNOSIS — H40053 Ocular hypertension, bilateral: Secondary | ICD-10-CM

## 2023-11-23 DIAGNOSIS — R5382 Chronic fatigue, unspecified: Secondary | ICD-10-CM | POA: Diagnosis not present

## 2023-11-23 DIAGNOSIS — R0683 Snoring: Secondary | ICD-10-CM | POA: Diagnosis not present

## 2023-11-25 ENCOUNTER — Encounter: Payer: Self-pay | Admitting: Nurse Practitioner

## 2023-11-25 NOTE — Progress Notes (Signed)
 Subjective:    Patient ID: Tracey Turner, female    DOB: 1956-03-04, 68 y.o.   MRN: [id number]  HPI Presents to have her FMLA papers completed.  Her hand and rehabilitation specialists has submitted a detailed analysis and plan for her FMLA.  Because of her other health issues, patient would like the FMLA to include her chronic health problems especially since this requires multiple days off for visits.  Of note patient has fatigue and snoring.  Has not had a sleep apnea test performed.  Sees a diabetic specialist for her type 2 diabetes.  Sees a specialist for lymphedema in both legs.  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  Gets regular eye exams with eye specialist to screen for diabetic retinopathy and for evaluation of ocular hypertension.  Is currently getting physical therapy for chronic left-sided low back pain without sciatica and arthritis of her knee.   Review of Systems  Constitutional:  Positive for fatigue.  Respiratory:  Negative for cough, chest tightness, shortness of breath and wheezing.   Cardiovascular:  Positive for leg swelling. Negative for chest pain.  Musculoskeletal:  Positive for arthralgias, back pain and joint swelling.      11/23/2023   11:07 AM  Depression screen PHQ 2/9  Decreased Interest 0  Down, Depressed, Hopeless 0  PHQ - 2 Score 0  Altered sleeping 0  Tired, decreased energy 1  Feeling bad or failure about yourself  0  Trouble concentrating 0  Moving slowly or fidgety/restless 0  Suicidal thoughts 0  PHQ-9 Score 1  Difficult doing work/chores Not difficult at all      11/23/2023   11:07 AM 12/15/2022    1:52 PM 09/15/2022    1:43 PM  GAD 7 : Generalized Anxiety Score  Nervous, Anxious, on Edge 0 0 0  Control/stop worrying 0 0 0  Worry too much - different things 0 0 0  Trouble relaxing 0 0 0  Restless 0 0 0  Easily annoyed or irritable 0 0 0  Afraid - awful might happen 0 0 0  Total GAD 7 Score 0 0 0   Anxiety Difficulty  Not difficult at all          Objective:   Physical Exam Vitals and nursing note reviewed.  Constitutional:      General: She is not in acute distress. Cardiovascular:     Rate and Rhythm: Normal rate and regular rhythm.     Heart sounds: Normal heart sounds. No murmur heard.    No gallop.  Pulmonary:     Effort: Pulmonary effort is normal.     Breath sounds: Normal breath sounds.  Neurological:     Mental Status: She is alert.  Psychiatric:        Mood and Affect: Mood normal.        Behavior: Behavior normal.        Thought Content: Thought content normal.        Judgment: Judgment normal.    Today's Vitals   11/23/23 1052  BP: (!) 149/80  Pulse: 61  Temp: 98.2 F (36.8 C)  SpO2: 98%  Weight: (!) 305 lb 3.2 oz (138.4 kg)  Height: 5\' 6"  (1.676 m)   Body mass index is 49.26 kg/m.         Assessment & Plan:   Problem List Items Addressed This Visit       Cardiovascular and  Mediastinum   Essential hypertension     Endocrine   Uncontrolled type 2 diabetes mellitus with hyperglycemia (HCC) - Primary     Other   Low back pain   Morbid obesity (HCC)   Relevant Orders   Ambulatory referral to Neurology   Ocular hypertension   Other Visit Diagnoses       Chronic fatigue       Relevant Orders   Ambulatory referral to Neurology     Snoring       Relevant Orders   Ambulatory referral to Neurology      Will include her multiple chronic health issues on her FMLA since this will require her to miss work several days a month for her appointments.  Will also refer for evaluation of possible obstructive sleep apnea. Routine follow-up here as planned on 01/10/2024 with Dr. Debrah Fan.

## 2023-11-26 ENCOUNTER — Other Ambulatory Visit: Payer: Self-pay | Admitting: Nurse Practitioner

## 2023-11-26 DIAGNOSIS — Z0279 Encounter for issue of other medical certificate: Secondary | ICD-10-CM

## 2023-11-26 DIAGNOSIS — M545 Low back pain, unspecified: Secondary | ICD-10-CM

## 2023-11-26 DIAGNOSIS — M1710 Unilateral primary osteoarthritis, unspecified knee: Secondary | ICD-10-CM

## 2023-11-27 ENCOUNTER — Encounter (HOSPITAL_COMMUNITY)

## 2023-11-30 ENCOUNTER — Encounter (HOSPITAL_COMMUNITY)

## 2023-12-05 ENCOUNTER — Encounter (HOSPITAL_COMMUNITY): Payer: Self-pay

## 2023-12-05 ENCOUNTER — Ambulatory Visit (HOSPITAL_COMMUNITY)

## 2023-12-05 DIAGNOSIS — Z7409 Other reduced mobility: Secondary | ICD-10-CM

## 2023-12-05 DIAGNOSIS — M5459 Other low back pain: Secondary | ICD-10-CM | POA: Diagnosis not present

## 2023-12-05 DIAGNOSIS — R29898 Other symptoms and signs involving the musculoskeletal system: Secondary | ICD-10-CM | POA: Diagnosis not present

## 2023-12-05 NOTE — Therapy (Signed)
 OUTPATIENT PHYSICAL THERAPY THORACOLUMBAR TREATMENT   Patient Name: Tracey Turner MRN: [id number] DOB:[birth year], 68 y.o., female Today's Date: 12/05/2023  END OF SESSION:  PT End of Session - 12/05/23 1650     Visit Number 3    Number of Visits 12    Date for PT Re-Evaluation 12/26/23    Authorization Type [employer] MEDICARE    Authorization Time Period [facility] approved 12 visits from 11/19/23-12/31/23    Progress Note Due on Visit 10    PT Start Time 1650    PT Stop Time 1728    PT Time Calculation (min) 38 min    Activity Tolerance Patient tolerated treatment well;Patient limited by pain    Behavior During Therapy [facility] for tasks assessed/performed          Past Medical History:  Diagnosis Date   Anemia    Arthritis    Back pain    Gastritis    Glaucoma    Hypertension    Knee pain    Type 2 diabetes mellitus (HCC)    Past Surgical History:  Procedure Laterality Date   COLONOSCOPY  02/13/2011   internal hemorrhoids   ESOPHAGOGASTRODUODENOSCOPY  02/13/2011   mild gatritis   GIVENS CAPSULE STUDY  03/28/2012       GIVENS CAPSULE STUDY  03/26/2012   Procedure: GIVENS CAPSULE STUDY;  Surgeon: Alyce Jubilee, MD;  Location: [facility];  Service: Endoscopy;  Laterality: N/A;  7:30   None     Patient Active Problem List   Diagnosis Date Noted   Low back pain 11/25/2021   Hyperlipidemia 04/15/2021   Varicose veins of both legs with edema 02/02/2020   Ocular hypertension 12/05/2019   GERD (gastroesophageal reflux disease) 06/15/2014   Morbid obesity (HCC) 12/18/2013   Uncontrolled type 2 diabetes mellitus with hyperglycemia (HCC) 09/12/2012   Essential hypertension 09/12/2012   Arthritis of knee 02/23/2011    PCP: Cook, Jayce G, DO  REFERRING PROVIDER: Cook, Jayce G, DO  REFERRING DIAG: [phone] (ICD-10-CM) - Chronic left-sided low back pain without sciatica M17.10 (ICD-10-CM) - Arthritis of knee  Rationale for Evaluation and Treatment: Rehabilitation  THERAPY  DIAG:  Other low back pain  Impaired functional mobility, balance, gait, and endurance  Weakness of both lower extremities  ONSET DATE:   SUBJECTIVE:                                                                                                                                                                                           SUBJECTIVE STATEMENT: Pt reports she was sick last week. Due to change in BP medicine. Been on  it for ~2 weeks. Reports LBP as 4/10 today. Not feeling her best.    Pt states she ruptured disc about 20 years ago while helping handicapped brother move and make transfers. Pt states the fall on new years eve 2024 on the left side but that the right side is the side that has been bothering her. Pt states she tries to control diabetes with diet, does not like the metformin . Pt states legs feel weak and states she can tell since the fall she has slowed down considerably. Pt states she does have some right knee pain but her main concern is her back and moving faster. Pt would also like to maintain ability to work.   PERTINENT HISTORY:  -Ruptured disc 20 years ago -[name] new years eve 2024 while going into work -Arthritis causes ankle pain -Laser surgery on legs, 2-3 years ago, still goes  -Diabetes, seeing specialist Friday  PAIN:  Are you having pain? Yes: NPRS scale: 6/10, 8-9/10 at worst Pain location: center of low back, and RLE  Pain description: aching pain, sharp with certain movements Aggravating factors: bending, twisting, lifting heavy weights, walking for a long time Relieving factors: pain patch, rest, sitting down  PRECAUTIONS: Fall  RED FLAGS: None   WEIGHT BEARING RESTRICTIONS: No  FALLS:  Has patient fallen in last 6 months? Yes. 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  DIAGNOSTIC FINDINGS:  CLINICAL DATA:  [name] down steps landing on left side. Subsequent hip and lower back pain   EXAM: CT LUMBAR SPINE WITHOUT CONTRAST   TECHNIQUE: Multidetector CT imaging of the lumbar spine was performed without intravenous contrast administration. Multiplanar CT image reconstructions were also generated.   RADIATION DOSE REDUCTION: This exam was performed according to the departmental dose-optimization program which includes automated exposure control, adjustment of the mA and/or kV according to patient size and/or use of iterative reconstruction technique.   COMPARISON:  Lumbar spine radiographs 11/17/2011   FINDINGS: Segmentation: 5 lumbar type vertebrae.   Alignment: No evidence of traumatic listhesis.   Vertebrae: Demineralization. No acute fracture or focal pathologic process.   Paraspinal and other soft tissues: Aortic atherosclerotic calcification.   Disc levels: Multilevel spondylosis with bulky bridging anterior osteophytes, disc space height loss, and degenerative endplate changes greatest at L3-L4 and L4-L5. Ankylosis of L4-L5. Multilevel moderate facet arthropathy. Spinal canal narrowing is greatest at L3-L4 where it is moderate secondary to posterior disc osteophyte complex and ligamentum flavum hypertrophy. Neural foraminal narrowing is greatest on the right at L3-L4 heart is moderate to advanced and on the left at L4-L5 where it is advanced.   IMPRESSION: 1. No acute fracture or evidence of traumatic listhesis. 2. Multilevel spondylosis and facet arthropathy.  PATIENT SURVEYS:  Modified Oswestry 27/50    COGNITION: Overall cognitive status: Within functional limits for tasks assessed     SENSATION: Pt states she has some neuropathy of both feet from diabetes, no numbness just doesn't feel as good as she used to.  POSTURE: rounded shoulders, forward head, and  decreased lumbar lordosis  PALPATION: Pt demonstrates tenderness to palpation to right lumbar paraspinals and L1-L5 spinous processes.   LUMBAR ROM:   AROM eval  Flexion 80 degrees, pain in low back  Extension 7, little discomfort not as bad as going forward   Right lateral flexion Fingers to knee, pain in low back  Left lateral flexion Fingers to knee, pain in low back  Right rotation 25% available, no pain but limited  Left rotation 25% available, no pain but limited   (Blank rows = not tested)  LOWER EXTREMITY ROM:     Active  Right eval Left eval  Hip flexion    Hip extension    Hip abduction    Hip adduction    Hip internal rotation    Hip external rotation    Knee flexion    Knee extension    Ankle dorsiflexion    Ankle plantarflexion    Ankle inversion    Ankle eversion     (Blank rows = not tested)  LOWER EXTREMITY MMT:    MMT Right eval Left eval  Hip flexion 3 3  Hip extension 4 4  Hip abduction 3+ 3+  Hip adduction 3 3  Hip internal rotation    Hip external rotation    Knee flexion 3+ 3+  Knee extension 4 4  Ankle dorsiflexion 4- 4-  Ankle plantarflexion    Ankle inversion    Ankle eversion     (Blank rows = not tested)    FUNCTIONAL TESTS:  5 times sit to stand: 32.11 2 minute walk test: 152 ft  GAIT: Distance walked: about 250 feet throughout session Assistive device utilized: None Level of assistance: Complete Independence Comments: Decreased gait quality and velocity. Pt demonstrates decreased stance time on RLE as two minute walk test progresses, pt reports due to pain in RLE.  TREATMENT DATE:  12/05/23: NuStep, Seat 11, 5', level 3, spm over 75 LTR, 2x10 Supine Hip  abduction, GTB, 2x10, +TA contraction    Hip adduction, yellow ball, 5 holds, 2x10  Bridge, 2x10 Side steps in // bars, 10x  11/19/23: Review of HEP and goals  STS, UE use, 2x10 Seated Piriformis, 15 holds, RLE only, 3x, assist of towel LTR, 10x ea side SKTC, 10x, assist of towel  PPT in hook-lying position, 5 holds, 2x10 TA contraction in hook-lying position, 5 holds, 10x NuStep, Seat 11, 5', level 1, spm over 75   11/14/2023  Evaluation: -ROM measured, Strength assessed, HEP prescribed, pt educated on prognosis, findings, and importance of HEP compliance if given.  Therapeutic Exercise: -STS 9 reps throughout session -LTR, 1 set of 8 reps bilaterally                                                                                                                                   PATIENT EDUCATION:  Education details: Pt was educated on findings of PT evaluation, prognosis,  frequency of therapy visits and rationale, attendance policy, and HEP if given.   Person educated: Patient Education method: Explanation, Verbal cues, and Handouts Education comprehension: verbalized understanding, verbal cues required, and needs further education  HOME EXERCISE PROGRAM: Access Code: [email or web address] URL: [email or web address] Date: 11/14/2023 Prepared by: Armond Bertin  Exercises - Supine Lower Trunk Rotation  - 1 x daily - 7 x weekly - 3 sets - 10 reps - Sit to Stand with Armchair  - 1 x daily - 7 x weekly - 3 sets - 10 reps   Access Code: [email or web address] URL: [email or web address] Date: 12/05/2023 Prepared by: Virgia Griffins Powell-Butler  Exercises - Hooklying Clamshell with Resistance  - 2 x daily - 7 x weekly - 3 sets - 10 reps - Supine Hip Adduction Isometric with Ball  - 2 x daily - 7 x weekly - 3 sets - 10 reps - 5 hold - Supine Bridge  - 2 x daily - 7 x weekly - 3 sets - 10 reps  ASSESSMENT:  CLINICAL IMPRESSION: Session limited due to patient not feeling well  this date. Gait velocity decreased and antalgic. Began on NuStep for general warm up of tissues. Followed with core and LE strengthening in supine position. Pt fearful of getting into side-lying position. Added hip add/abduction and bridges to HEP as pt tolerated well. Bridges irritating to low back back but improves with verbal cueing to limit to 50% of motion. Verbal cueing for form during side steps in parallel bars with patient tending to ER feet, hips and trunk. Further encouragement for joining [employer] to utilize pool. Patient will continue to benefit from skilled physical therapy in order to address strength, endurance, and pain in order to improve QOL and function.     EVAL: Patient is a 68 y.o. female who was seen today for physical therapy evaluation and treatment for M54.50,G89.29 (ICD-10-CM) - Chronic left-sided low back pain without sciatica M17.10 (ICD-10-CM) - Arthritis of knee.  Patient demonstrates right sided low back pain, RLE pain with ambulation, decreased LE strength, and impaired balance. Patient also demonstrates difficulty with ambulation during today's session with decreased RLE stance time, BLE stride length and decreased velocity noted. Patient also demonstrates decreased ability to perform STS without BUE support at this time. Patient requires education on role of PT, POC, and various techniques involved with treatment of low back. Patient would benefit from skilled physical therapy for decreased low back pain, increased endurance with ambulation, increased LE strength, and balance for improved gait quality, return to higher level of function with ADLs, and progress towards therapy goals.   OBJECTIVE IMPAIRMENTS: Abnormal gait, decreased activity tolerance, decreased balance, decreased endurance, decreased mobility, difficulty walking, decreased strength, impaired sensation, obesity, and pain.   ACTIVITY LIMITATIONS: carrying, lifting, bending, standing, squatting, stairs,  transfers, bed mobility, and dressing  PARTICIPATION LIMITATIONS: meal prep, cleaning, laundry, driving, community activity, occupation, and yard work  PERSONAL FACTORS: Fitness, Past/current experiences, Time since onset of injury/illness/exacerbation, and 1-2 comorbidities: diabetes, working 10 hour shifts in which she is required to stand for 9/10 hours are also affecting patient's functional outcome.   REHAB POTENTIAL: Good  CLINICAL DECISION MAKING: Evolving/moderate complexity  EVALUATION COMPLEXITY: Low   GOALS: Goals reviewed with patient? Yes  SHORT TERM GOALS: Target date: 12/05/23  Pt will be independent with HEP in order to demonstrate participation in Physical Therapy POC.  Baseline: Goal status: INITIAL  2.  Pt will report 6/10 pain at worst with mobility in order to demonstrate improved pain  with ADLs.  Baseline: 8-9/10 Goal status: INITIAL  LONG TERM GOALS: Target date: 12/26/23  Pt will improve 5TSTS by 2.3 seconds in order to demonstrate improved functional strength to return to desired activities.  Baseline: see objective.  Goal status: INITIAL  2.  Pt will improve 2 MWT by 140 in order to demonstrate improved functional ambulatory capacity in community setting.  Baseline: see objective.  Goal status: INITIAL  3.  Pt will improve Modified Oswestry score by 6 in order to demonstrate improved pain with functional goals and outcomes. Baseline: see objective.  Goal status: INITIAL  4.  Pt will report 4/10 pain with mobility in order to demonstrate reduced pain with ADLs lasting greater than 30 minutes.  Baseline: see objective.  Goal status: INITIAL   PLAN:  PT FREQUENCY: 2x/week  PT DURATION: 6 weeks  PLANNED INTERVENTIONS: 97110-Therapeutic exercises, 97530- Therapeutic activity, 97112- Neuromuscular re-education, 97535- Self Care, [id number]- Manual therapy, [phone]- Gait training, Patient/Family education, Balance training, Stair training, Joint  mobilization, Spinal mobilization, Manual lymph drainage, DME instructions, Cryotherapy, and Moist heat.  PLAN FOR NEXT SESSION: progress HEP, encourage walking program, progress core and LE strengthening as well as endurance training.   5:34 PM, 12/05/23 Marysue Sola, PT, DPT [facility] [place]

## 2023-12-07 ENCOUNTER — Encounter (HOSPITAL_COMMUNITY)

## 2023-12-12 ENCOUNTER — Other Ambulatory Visit: Payer: Self-pay | Admitting: Family Medicine

## 2023-12-12 ENCOUNTER — Encounter (HOSPITAL_COMMUNITY)

## 2023-12-14 ENCOUNTER — Ambulatory Visit (HOSPITAL_COMMUNITY)

## 2023-12-14 ENCOUNTER — Encounter (HOSPITAL_COMMUNITY): Payer: Self-pay

## 2023-12-14 DIAGNOSIS — Z7409 Other reduced mobility: Secondary | ICD-10-CM | POA: Diagnosis not present

## 2023-12-14 DIAGNOSIS — M5459 Other low back pain: Secondary | ICD-10-CM

## 2023-12-14 DIAGNOSIS — R29898 Other symptoms and signs involving the musculoskeletal system: Secondary | ICD-10-CM | POA: Diagnosis not present

## 2023-12-14 NOTE — Therapy (Signed)
 OUTPATIENT PHYSICAL THERAPY THORACOLUMBAR TREATMENT   Patient Name: Tracey Turner MRN: [id number] DOB:11-Sep-1955, 68 y.o., female Today's Date: 12/14/2023  END OF SESSION:  PT End of Session - 12/14/23 1349     Visit Number 4    Number of Visits 12    Date for PT Re-Evaluation 12/26/23    Authorization Type [employer] MEDICARE    Authorization Time Period uhc approved 12 visits from 11/19/23-12/31/23    Authorization - Visit Number 3    Authorization - Number of Visits 12    PT Start Time 1350    PT Stop Time 1428    PT Time Calculation (min) 38 min    Activity Tolerance Patient tolerated treatment well;Patient limited by pain    Behavior During Therapy [facility] for tasks assessed/performed           Past Medical History:  Diagnosis Date   Anemia    Arthritis    Back pain    Gastritis    Glaucoma    Hypertension    Knee pain    Type 2 diabetes mellitus (HCC)    Past Surgical History:  Procedure Laterality Date   COLONOSCOPY  02/13/2011   internal hemorrhoids   ESOPHAGOGASTRODUODENOSCOPY  02/13/2011   mild gatritis   GIVENS CAPSULE STUDY  03/28/2012       GIVENS CAPSULE STUDY  03/26/2012   Procedure: GIVENS CAPSULE STUDY;  Surgeon: Margo LITTIE Haddock, MD;  Location: [facility];  Service: Endoscopy;  Laterality: N/A;  7:30   None     Patient Active Problem List   Diagnosis Date Noted   Low back pain 11/25/2021   Hyperlipidemia 04/15/2021   Varicose veins of both legs with edema 02/02/2020   Ocular hypertension 12/05/2019   GERD (gastroesophageal reflux disease) 06/15/2014   Morbid obesity (HCC) 12/18/2013   Uncontrolled type 2 diabetes mellitus with hyperglycemia (HCC) 09/12/2012   Essential hypertension 09/12/2012   Arthritis of knee 02/23/2011    PCP: Cook, Jayce G, DO  REFERRING PROVIDER: Cook, Jayce G, DO  REFERRING DIAG: [phone] (ICD-10-CM) - Chronic left-sided low back pain without sciatica M17.10 (ICD-10-CM) - Arthritis of knee  Rationale for Evaluation  and Treatment: Rehabilitation  THERAPY DIAG:  Other low back pain  Impaired functional mobility, balance, gait, and endurance  Weakness of both lower extremities  ONSET DATE:   SUBJECTIVE:                                                                                                                                                                                           SUBJECTIVE STATEMENT: Pt reports low back pain is  better than what is has been still a 5/10 pain upon presentation. Pt states she has a lot going on at the house, septic issues and has been going through some medication changes. Pt has not been able to do much HEP will all this going on.   Pt states she ruptured disc about 20 years ago while helping handicapped brother move and make transfers. Pt states the fall on new years eve 2024 on the left side but that the right side is the side that has been bothering her. Pt states she tries to control diabetes with diet, does not like the metformin . Pt states legs feel weak and states she can tell since the fall she has slowed down considerably. Pt states she does have some right knee pain but her main concern is her back and moving faster. Pt would also like to maintain ability to work.   PERTINENT HISTORY:  -Ruptured disc 20 years ago -Clemens new years eve 2024 while going into work -Arthritis causes ankle pain -Laser surgery on legs, 2-3 years ago, still goes  -Diabetes, seeing specialist Friday  PAIN:  Are you having pain? Yes: NPRS scale: 6/10, 8-9/10 at worst Pain location: center of low back, and RLE  Pain description: aching pain, sharp with certain movements Aggravating factors: bending, twisting, lifting heavy weights, walking for a long time Relieving factors: pain patch, rest, sitting down  PRECAUTIONS: Fall  RED FLAGS: None   WEIGHT BEARING RESTRICTIONS: No  FALLS:  Has patient fallen in last 6 months? Yes. 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  DIAGNOSTIC FINDINGS:  CLINICAL DATA:  Clemens down steps landing on left side. Subsequent hip and lower back pain   EXAM: CT LUMBAR SPINE WITHOUT CONTRAST   TECHNIQUE: Multidetector CT imaging of the lumbar spine was performed without intravenous contrast administration. Multiplanar CT image reconstructions were also generated.   RADIATION DOSE REDUCTION: This exam was performed according to the departmental dose-optimization program which includes automated exposure control, adjustment of the mA and/or kV according to patient size and/or use of iterative reconstruction technique.   COMPARISON:  Lumbar spine radiographs 11/17/2011   FINDINGS: Segmentation: 5 lumbar type vertebrae.   Alignment: No evidence of traumatic listhesis.   Vertebrae: Demineralization. No acute fracture or focal pathologic process.   Paraspinal and other soft tissues: Aortic atherosclerotic calcification.   Disc levels: Multilevel spondylosis with bulky bridging anterior osteophytes, disc space height loss, and degenerative endplate changes greatest at L3-L4 and L4-L5. Ankylosis of L4-L5. Multilevel moderate facet arthropathy. Spinal canal narrowing is greatest at L3-L4 where it is moderate secondary to posterior disc osteophyte complex and ligamentum flavum hypertrophy. Neural foraminal narrowing is greatest on the right at L3-L4 heart is moderate to advanced and on the left at L4-L5 where it is advanced.    IMPRESSION: 1. No acute fracture or evidence of traumatic listhesis. 2. Multilevel spondylosis and facet arthropathy.  PATIENT SURVEYS:  Modified Oswestry 27/50   COGNITION: Overall cognitive status: Within functional limits for tasks assessed  SENSATION: Pt states she has some neuropathy of both feet from diabetes, no numbness just doesn't feel as good as she used to.  POSTURE: rounded shoulders, forward head, and decreased lumbar lordosis  PALPATION: Pt demonstrates tenderness to palpation to right lumbar paraspinals and L1-L5 spinous processes.   LUMBAR ROM:   AROM eval  Flexion 80 degrees, pain in low back  Extension 7, little discomfort not as bad as going forward   Right lateral flexion Fingers to knee, pain in low back  Left lateral flexion Fingers to knee, pain in low back  Right rotation 25% available, no pain but limited  Left rotation 25% available, no pain but limited   (Blank rows = not tested)  LOWER EXTREMITY ROM:     Active  Right eval Left eval  Hip flexion    Hip extension    Hip abduction    Hip adduction    Hip internal rotation    Hip external rotation    Knee flexion    Knee extension    Ankle dorsiflexion    Ankle plantarflexion    Ankle inversion    Ankle eversion     (Blank rows = not tested)  LOWER EXTREMITY MMT:    MMT Right eval Left eval  Hip flexion 3 3  Hip extension 4 4  Hip abduction 3+ 3+  Hip adduction 3 3  Hip internal rotation    Hip external rotation    Knee flexion 3+ 3+  Knee extension 4 4  Ankle dorsiflexion 4- 4-  Ankle plantarflexion    Ankle inversion    Ankle eversion     (Blank rows = not tested)    FUNCTIONAL TESTS:  5 times sit to stand: 32.11 2 minute walk test: 152 ft  GAIT: Distance walked: about 250 feet throughout session Assistive device utilized: None Level of assistance: Complete Independence Comments: Decreased gait quality and velocity. Pt demonstrates decreased stance time on  RLE as two minute walk test progresses, pt reports due to pain in RLE.  TREATMENT DATE:  12/14/2023  Therapeutic Exercise: -Nustep, 5 minutes, level 1 resistance, pt cued for pain free speed -Supine bridges 2 sets of 10 reps, 3 second holds, symptomatic, pt cued for max hip extension -Standing 3 way hip 2 sets 5 reps, bilaterally, pt cued for upright trunk and maintaining of neutral spine -Lateral stepping 3 laps 14 steps per lap, with GTB around ankles, pt cued for upright posture -Lateral step up and overs, 1 set of 7 reps, pt cued for leading LE and one UE    Neuromuscular Re-education: -DKTC, on green exercise ball, 2 sets of 10 reps, pt cued for pain free ROM -LTR 2 set of 10 reps bilaterally, pt cued to remain in pain free ROM -Sit to stands with 10lb kettle bell, 2 sets of 8 reps, pt cued for core activation     12/05/23: NuStep, Seat 11, 5', level 3, spm over 75 LTR, 2x10 Supine Hip abduction, GTB, 2x10, +TA contraction    Hip adduction, yellow ball, 5 holds, 2x10  Bridge, 2x10 Side steps in // bars, 10x  11/19/23: Review of HEP and goals  STS, UE use, 2x10 Seated Piriformis, 15 holds, RLE only, 3x, assist of towel LTR, 10x ea side SKTC, 10x, assist of towel  PPT in hook-lying position, 5 holds, 2x10 TA contraction in hook-lying position, 5 holds, 10x NuStep, Seat 11, 5', level 1, spm over 75  PATIENT EDUCATION:  Education details: Pt was educated on findings of PT evaluation, prognosis, frequency of therapy visits and rationale, attendance policy, and HEP if given.   Person educated: Patient Education method: Explanation, Verbal cues, and Handouts Education comprehension: verbalized understanding, verbal cues required, and needs further education  HOME EXERCISE PROGRAM: Access Code: [email or web address] URL:  [email or web address] Date: 11/14/2023 Prepared by: Lang Ada  Exercises - Supine Lower Trunk Rotation  - 1 x daily - 7 x weekly - 3 sets - 10 reps - Sit to Stand with Armchair  - 1 x daily - 7 x weekly - 3 sets - 10 reps   Access Code: [email or web address] URL: [email or web address] Date: 12/05/2023 Prepared by: Rosaria Powell-Butler  Exercises - Hooklying Clamshell with Resistance  - 2 x daily - 7 x weekly - 3 sets - 10 reps - Supine Hip Adduction Isometric with Ball  - 2 x daily - 7 x weekly - 3 sets - 10 reps - 5 hold - Supine Bridge  - 2 x daily - 7 x weekly - 3 sets - 10 reps  ASSESSMENT:  CLINICAL IMPRESSION: Patient continues to demonstrate low back pain, decreased LE strength, decreased gait quality and balance. Patient also demonstrates decreased endurance with aerobic based exercise during today's session having to slow down on nustep. Patient able to progress dynamic balance and core activation exercises today with STS variations and step up variations, good performance with verbal cueing. Patient would continue to benefit from skilled physical therapy for increased endurance with ambulation, increased LE strength, and improved balance for improved quality of life, improved independence with gait training and continued progress towards therapy goals.     EVAL: Patient is a 68 y.o. female who was seen today for physical therapy evaluation and treatment for M54.50,G89.29 (ICD-10-CM) - Chronic left-sided low back pain without sciatica M17.10 (ICD-10-CM) - Arthritis of knee.  Patient demonstrates right sided low back pain, RLE pain with ambulation, decreased LE strength, and impaired balance. Patient also demonstrates difficulty with ambulation during today's session with decreased RLE stance time, BLE stride length and decreased velocity noted. Patient also demonstrates decreased ability to perform STS without BUE support at this time. Patient requires education  on role of PT, POC, and various techniques involved with treatment of low back. Patient would benefit from skilled physical therapy for decreased low back pain, increased endurance with ambulation, increased LE strength, and balance for improved gait quality, return to higher level of function with ADLs, and progress towards therapy goals.   OBJECTIVE IMPAIRMENTS: Abnormal gait, decreased activity tolerance, decreased balance, decreased endurance, decreased mobility, difficulty walking, decreased strength, impaired sensation, obesity, and pain.   ACTIVITY LIMITATIONS: carrying, lifting, bending, standing, squatting, stairs, transfers, bed mobility, and dressing  PARTICIPATION LIMITATIONS: meal prep, cleaning, laundry, driving, community activity, occupation, and yard work  PERSONAL FACTORS: Fitness, Past/current experiences, Time since onset of injury/illness/exacerbation, and 1-2 comorbidities: diabetes, working 10 hour shifts in which she is required to stand for 9/10 hours are also affecting patient's functional outcome.   REHAB POTENTIAL: Good  CLINICAL DECISION MAKING: Evolving/moderate complexity  EVALUATION COMPLEXITY: Low   GOALS: Goals reviewed with patient? Yes  SHORT TERM GOALS: Target date: 12/05/23  Pt will be independent with HEP in order to demonstrate participation in Physical Therapy POC.  Baseline: Goal status: INITIAL  2.  Pt will report 6/10 pain at worst with mobility in order to demonstrate improved pain with ADLs.  Baseline: 8-9/10 Goal status: INITIAL  LONG TERM GOALS: Target  date: 12/26/23  Pt will improve 5TSTS by 2.3 seconds in order to demonstrate improved functional strength to return to desired activities.  Baseline: see objective.  Goal status: INITIAL  2.  Pt will improve 2 MWT by 140 in order to demonstrate improved functional ambulatory capacity in community setting.  Baseline: see objective.  Goal status: INITIAL  3.  Pt will improve  Modified Oswestry score by 6 in order to demonstrate improved pain with functional goals and outcomes. Baseline: see objective.  Goal status: INITIAL  4.  Pt will report 4/10 pain with mobility in order to demonstrate reduced pain with ADLs lasting greater than 30 minutes.  Baseline: see objective.  Goal status: INITIAL   PLAN:  PT FREQUENCY: 2x/week  PT DURATION: 6 weeks  PLANNED INTERVENTIONS: 97110-Therapeutic exercises, 97530- Therapeutic activity, 97112- Neuromuscular re-education, 97535- Self Care, [id number]- Manual therapy, [phone]- Gait training, Patient/Family education, Balance training, Stair training, Joint mobilization, Spinal mobilization, Manual lymph drainage, DME instructions, Cryotherapy, and Moist heat.  PLAN FOR NEXT SESSION: progress HEP, encourage walking program, progress core and LE strengthening as well as endurance training. Pt would like to add 3 missed session to the end of POC.   Athziri Freundlich, PT, DPT [facility] Office: [phone] 2:30 PM, 12/14/23

## 2023-12-17 NOTE — Telephone Encounter (Signed)
 Pharmacy Patient Advocate Encounter  Received notification from [employer] that Prior Authorization for Ozempic  has been APPROVED from 11/16/23 to 11/15/24   PA #/Case ID/Reference #: [id number]

## 2023-12-19 ENCOUNTER — Encounter (HOSPITAL_COMMUNITY): Payer: Self-pay

## 2023-12-19 ENCOUNTER — Ambulatory Visit (HOSPITAL_COMMUNITY): Attending: Family Medicine

## 2023-12-19 DIAGNOSIS — M5459 Other low back pain: Secondary | ICD-10-CM | POA: Diagnosis not present

## 2023-12-19 DIAGNOSIS — R29898 Other symptoms and signs involving the musculoskeletal system: Secondary | ICD-10-CM | POA: Insufficient documentation

## 2023-12-19 DIAGNOSIS — Z7409 Other reduced mobility: Secondary | ICD-10-CM | POA: Insufficient documentation

## 2023-12-19 NOTE — Therapy (Signed)
 OUTPATIENT PHYSICAL THERAPY THORACOLUMBAR TREATMENT   Patient Name: Tracey Turner MRN: [id number] DOB:July 15, 1955, 68 y.o., female Today's Date: 12/19/2023  END OF SESSION:  PT End of Session - 12/19/23 1648     Visit Number 5    Number of Visits 12    Date for PT Re-Evaluation 12/26/23    Authorization Type [employer] MEDICARE    Authorization Time Period [facility]c approved 12 visits from 11/19/23-12/31/23    Authorization - Visit Number 4    Authorization - Number of Visits 12    Progress Note Due on Visit 10    PT Start Time 1648    PT Stop Time 1727    PT Time Calculation (min) 39 min    Activity Tolerance Patient tolerated treatment well;Patient limited by pain    Behavior During Therapy [facility] for tasks assessed/performed            Past Medical History:  Diagnosis Date   Anemia    Arthritis    Back pain    Gastritis    Glaucoma    Hypertension    Knee pain    Type 2 diabetes mellitus (HCC)    Past Surgical History:  Procedure Laterality Date   COLONOSCOPY  02/13/2011   internal hemorrhoids   ESOPHAGOGASTRODUODENOSCOPY  02/13/2011   mild gatritis   GIVENS CAPSULE STUDY  03/28/2012       GIVENS CAPSULE STUDY  03/26/2012   Procedure: GIVENS CAPSULE STUDY;  Surgeon: Margo LITTIE Haddock, MD;  Location: [facility];  Service: Endoscopy;  Laterality: N/A;  7:30   None     Patient Active Problem List   Diagnosis Date Noted   Low back pain 11/25/2021   Hyperlipidemia 04/15/2021   Varicose veins of both legs with edema 02/02/2020   Ocular hypertension 12/05/2019   GERD (gastroesophageal reflux disease) 06/15/2014   Morbid obesity (HCC) 12/18/2013   Uncontrolled type 2 diabetes mellitus with hyperglycemia (HCC) 09/12/2012   Essential hypertension 09/12/2012   Arthritis of knee 02/23/2011    PCP: Cook, Jayce G, DO  REFERRING PROVIDER: Cook, Jayce G, DO  REFERRING DIAG: [phone] (ICD-10-CM) - Chronic left-sided low back pain without sciatica M17.10 (ICD-10-CM) - Arthritis  of knee  Rationale for Evaluation and Treatment: Rehabilitation  THERAPY DIAG:  Other low back pain  Impaired functional mobility, balance, gait, and endurance  Weakness of both lower extremities  ONSET DATE:   SUBJECTIVE:  SUBJECTIVE STATEMENT: Pt states pain low back pain/stiffness is still about a 5/10 pain, presents with cane today. Pt states she has been working on her HEP but does not do them all everyday.   Pt states she ruptured disc about 20 years ago while helping handicapped brother move and make transfers. Pt states the fall on new years eve 2024 on the left side but that the right side is the side that has been bothering her. Pt states she tries to control diabetes with diet, does not like the metformin . Pt states legs feel weak and states she can tell since the fall she has slowed down considerably. Pt states she does have some right knee pain but her main concern is her back and moving faster. Pt would also like to maintain ability to work.   PERTINENT HISTORY:  -Ruptured disc 20 years ago -Clemens new years eve 2024 while going into work -Arthritis causes ankle pain -Laser surgery on legs, 2-3 years ago, still goes  -Diabetes, seeing specialist Friday  PAIN:  Are you having pain? Yes: NPRS scale: 6/10, 8-9/10 at worst Pain location: center of low back, and RLE  Pain description: aching pain, sharp with certain movements Aggravating factors: bending, twisting, lifting heavy weights, walking for a long time Relieving factors: pain patch, rest, sitting down  PRECAUTIONS: Fall  RED FLAGS: None   WEIGHT BEARING RESTRICTIONS: No  FALLS:  Has patient fallen in last 6 months? Yes. 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  DIAGNOSTIC FINDINGS:  CLINICAL DATA:  Clemens down steps landing on left side. Subsequent hip and lower back pain   EXAM: CT LUMBAR SPINE WITHOUT CONTRAST   TECHNIQUE: Multidetector CT imaging of the lumbar spine was performed without intravenous contrast administration. Multiplanar CT image reconstructions were also generated.   RADIATION DOSE REDUCTION: This exam was performed according to the departmental dose-optimization program which includes automated exposure control, adjustment of the mA and/or kV according to patient size and/or use of iterative reconstruction technique.   COMPARISON:  Lumbar spine radiographs 11/17/2011   FINDINGS: Segmentation: 5 lumbar type vertebrae.   Alignment: No evidence of traumatic listhesis.   Vertebrae: Demineralization. No acute fracture or focal pathologic process.   Paraspinal and other soft tissues: Aortic atherosclerotic calcification.   Disc levels: Multilevel spondylosis with bulky bridging anterior osteophytes, disc space height loss, and degenerative endplate changes greatest at L3-L4 and L4-L5. Ankylosis of L4-L5. Multilevel moderate facet arthropathy. Spinal canal narrowing is greatest at L3-L4 where it is moderate secondary to posterior disc osteophyte complex and ligamentum flavum hypertrophy. Neural foraminal narrowing is greatest on the right at L3-L4 heart is moderate to advanced and on the left at L4-L5 where it is advanced.   IMPRESSION: 1. No acute fracture or evidence of  traumatic listhesis. 2. Multilevel spondylosis and facet arthropathy.  PATIENT SURVEYS:  Modified Oswestry 27/50   COGNITION: Overall cognitive status: Within functional limits for tasks assessed     SENSATION: Pt states she has some neuropathy of both feet  from diabetes, no numbness just doesn't feel as good as she used to.  POSTURE: rounded shoulders, forward head, and decreased lumbar lordosis  PALPATION: Pt demonstrates tenderness to palpation to right lumbar paraspinals and L1-L5 spinous processes.   LUMBAR ROM:   AROM eval  Flexion 80 degrees, pain in low back  Extension 7, little discomfort not as bad as going forward   Right lateral flexion Fingers to knee, pain in low back  Left lateral flexion Fingers to knee, pain in low back  Right rotation 25% available, no pain but limited  Left rotation 25% available, no pain but limited   (Blank rows = not tested)  LOWER EXTREMITY ROM:     Active  Right eval Left eval  Hip flexion    Hip extension    Hip abduction    Hip adduction    Hip internal rotation    Hip external rotation    Knee flexion    Knee extension    Ankle dorsiflexion    Ankle plantarflexion    Ankle inversion    Ankle eversion     (Blank rows = not tested)  LOWER EXTREMITY MMT:    MMT Right eval Left eval  Hip flexion 3 3  Hip extension 4 4  Hip abduction 3+ 3+  Hip adduction 3 3  Hip internal rotation    Hip external rotation    Knee flexion 3+ 3+  Knee extension 4 4  Ankle dorsiflexion 4- 4-  Ankle plantarflexion    Ankle inversion    Ankle eversion     (Blank rows = not tested)    FUNCTIONAL TESTS:  5 times sit to stand: 32.11 2 minute walk test: 152 ft  GAIT: Distance walked: about 250 feet throughout session Assistive device utilized: None Level of assistance: Complete Independence Comments: Decreased gait quality and velocity. Pt demonstrates decreased stance time on RLE as two minute walk test progresses, pt reports  due to pain in RLE.  TREATMENT DATE:  12/19/2023  Therapeutic Exercise: -Walking bout with SPC around clinic, 5 minutes, pt demonstrates decreased stride length and velocity. -Supine marching with PPT, 1 set of 10 reps bilaterally -Standing shoulder extensions, 2 sets of 10 reps, pt cued for controlled eccentric phase -Lateral stepping 3 laps 14 steps per lap, with GTB around ankles, pt cued for upright posture -Step ups, 1 set of 7 reps, 8 inch step, pt cued for leading LE and one UE -Single knee to chest with green strap for OP, 2 sets of 30 seconds -LTR 2 set of 10 reps bilaterally, pt cued to remain in pain free ROM -Sit to stands with Tidal tank column, 2 sets of 8 reps, pt cued for core activation, second set with march, pt cued for increased core activation   12/14/2023  Therapeutic Exercise: -Nustep, 5 minutes, level 1 resistance, pt cued for pain free speed -Supine bridges 2 sets of 10 reps, 3 second holds, symptomatic, pt cued for max hip extension -Standing 3 way hip 2 sets 5 reps, bilaterally, pt cued for upright trunk and maintaining of neutral spine -Lateral stepping 3 laps 14 steps per lap, with GTB around ankles, pt cued for upright posture -Lateral step up and overs, 1 set of 7 reps, pt cued for leading LE and one UE    Neuromuscular Re-education: -DKTC, on green exercise ball, 2 sets of 10 reps, pt cued for pain free ROM -LTR 2 set of 10 reps bilaterally, pt cued to remain in  pain free ROM -Sit to stands with 10lb kettle bell, 2 sets of 8 reps, pt cued for core activation     12/05/23: NuStep, Seat 11, 5', level 3, spm over 75 LTR, 2x10 Supine Hip abduction, GTB, 2x10, +TA contraction    Hip adduction, yellow ball, 5 holds, 2x10  Bridge, 2x10 Side steps in // bars, 10x                                                                                                                          PATIENT EDUCATION:  Education details: Pt was educated on findings of  PT evaluation, prognosis, frequency of therapy visits and rationale, attendance policy, and HEP if given.   Person educated: Patient Education method: Explanation, Verbal cues, and Handouts Education comprehension: verbalized understanding, verbal cues required, and needs further education  HOME EXERCISE PROGRAM: Access Code: [email or web address] URL: [email or web address] Date: 11/14/2023 Prepared by: Lang Ada  Exercises - Supine Lower Trunk Rotation  - 1 x daily - 7 x weekly - 3 sets - 10 reps - Sit to Stand with Armchair  - 1 x daily - 7 x weekly - 3 sets - 10 reps   Access Code: [email or web address] URL: [email or web address] Date: 12/05/2023 Prepared by: Rosaria Powell-Butler  Exercises - Hooklying Clamshell with Resistance  - 2 x daily - 7 x weekly - 3 sets - 10 reps - Supine Hip Adduction Isometric with Ball  - 2 x daily - 7 x weekly - 3 sets - 10 reps - 5 hold - Supine Bridge  - 2 x daily - 7 x weekly - 3 sets - 10 reps  ASSESSMENT:  CLINICAL IMPRESSION: Patient continues to demonstrate low back pain/stiffness, decreased LE strength/ROM, decreased gait quality and balance. Patient also demonstrates decreased endurance with aerobic based exercise during today's session having to slow down during 5 minute walk. Patient able to progress dynamic balance and core activation exercises today with standing shoulder extensions and PPT marching, good performance with verbal cueing. Patient would continue to benefit from skilled physical therapy for increased endurance with ambulation, increased LE strength/ROM, and improved balance for improved quality of life, improved independence with gait training and continued progress towards therapy goals.     EVAL: Patient is a 68 y.o. female who was seen today for physical therapy evaluation and treatment for M54.50,G89.29 (ICD-10-CM) - Chronic left-sided low back pain without sciatica M17.10 (ICD-10-CM) - Arthritis of knee.  Patient  demonstrates right sided low back pain, RLE pain with ambulation, decreased LE strength, and impaired balance. Patient also demonstrates difficulty with ambulation during today's session with decreased RLE stance time, BLE stride length and decreased velocity noted. Patient also demonstrates decreased ability to perform STS without BUE support at this time. Patient requires education on role of PT, POC, and various techniques involved with treatment of low back. Patient would benefit from skilled physical therapy for decreased low back pain, increased endurance with ambulation, increased LE strength, and balance for improved  gait quality, return to higher level of function with ADLs, and progress towards therapy goals.   OBJECTIVE IMPAIRMENTS: Abnormal gait, decreased activity tolerance, decreased balance, decreased endurance, decreased mobility, difficulty walking, decreased strength, impaired sensation, obesity, and pain.   ACTIVITY LIMITATIONS: carrying, lifting, bending, standing, squatting, stairs, transfers, bed mobility, and dressing  PARTICIPATION LIMITATIONS: meal prep, cleaning, laundry, driving, community activity, occupation, and yard work  PERSONAL FACTORS: Fitness, Past/current experiences, Time since onset of injury/illness/exacerbation, and 1-2 comorbidities: diabetes, working 10 hour shifts in which she is required to stand for 9/10 hours are also affecting patient's functional outcome.   REHAB POTENTIAL: Good  CLINICAL DECISION MAKING: Evolving/moderate complexity  EVALUATION COMPLEXITY: Low   GOALS: Goals reviewed with patient? Yes  SHORT TERM GOALS: Target date: 12/05/23  Pt will be independent with HEP in order to demonstrate participation in Physical Therapy POC.  Baseline: Goal status: INITIAL  2.  Pt will report 6/10 pain at worst with mobility in order to demonstrate improved pain with ADLs.  Baseline: 8-9/10 Goal status: INITIAL  LONG TERM GOALS: Target date:  12/26/23  Pt will improve 5TSTS by 2.3 seconds in order to demonstrate improved functional strength to return to desired activities.  Baseline: see objective.  Goal status: INITIAL  2.  Pt will improve 2 MWT by 140 in order to demonstrate improved functional ambulatory capacity in community setting.  Baseline: see objective.  Goal status: INITIAL  3.  Pt will improve Modified Oswestry score by 6 in order to demonstrate improved pain with functional goals and outcomes. Baseline: see objective.  Goal status: INITIAL  4.  Pt will report 4/10 pain with mobility in order to demonstrate reduced pain with ADLs lasting greater than 30 minutes.  Baseline: see objective.  Goal status: INITIAL   PLAN:  PT FREQUENCY: 2x/week  PT DURATION: 6 weeks  PLANNED INTERVENTIONS: 97110-Therapeutic exercises, 97530- Therapeutic activity, 97112- Neuromuscular re-education, 97535- Self Care, [id number]- Manual therapy, [phone]- Gait training, Patient/Family education, Balance training, Stair training, Joint mobilization, Spinal mobilization, Manual lymph drainage, DME instructions, Cryotherapy, and Moist heat.  PLAN FOR NEXT SESSION: progress HEP, encourage walking program, progress core and LE strengthening as well as endurance training. Pt would like to add 3 missed session to the end of POC.   Sim Choquette, PT, DPT [facility] Office: [phone] 5:32 PM, 12/19/23

## 2023-12-25 ENCOUNTER — Ambulatory Visit (HOSPITAL_COMMUNITY)

## 2023-12-25 ENCOUNTER — Encounter (HOSPITAL_COMMUNITY): Payer: Self-pay

## 2023-12-25 DIAGNOSIS — R29898 Other symptoms and signs involving the musculoskeletal system: Secondary | ICD-10-CM | POA: Diagnosis not present

## 2023-12-25 DIAGNOSIS — M5459 Other low back pain: Secondary | ICD-10-CM | POA: Diagnosis not present

## 2023-12-25 DIAGNOSIS — Z7409 Other reduced mobility: Secondary | ICD-10-CM

## 2023-12-25 NOTE — Therapy (Signed)
 OUTPATIENT PHYSICAL THERAPY THORACOLUMBAR TREATMENT   Patient Name: Tracey Turner MRN: [id number] DOB:July 14, 1955, 68 y.o., female Today's Date: 12/25/2023  END OF SESSION:  PT End of Session - 12/25/23 1647     Visit Number 6    Number of Visits 12    Date for PT Re-Evaluation 12/26/23    Authorization Type [employer] MEDICARE    Authorization Time Period [facility]c approved 12 visits from 11/19/23-12/31/23    Authorization - Visit Number 5    Authorization - Number of Visits 12    Progress Note Due on Visit 10    PT Start Time 1647    PT Stop Time 1725    PT Time Calculation (min) 38 min    Activity Tolerance Patient tolerated treatment well;Patient limited by pain    Behavior During Therapy [facility] for tasks assessed/performed             Past Medical History:  Diagnosis Date   Anemia    Arthritis    Back pain    Gastritis    Glaucoma    Hypertension    Knee pain    Type 2 diabetes mellitus (HCC)    Past Surgical History:  Procedure Laterality Date   COLONOSCOPY  02/13/2011   internal hemorrhoids   ESOPHAGOGASTRODUODENOSCOPY  02/13/2011   mild gatritis   GIVENS CAPSULE STUDY  03/28/2012       GIVENS CAPSULE STUDY  03/26/2012   Procedure: GIVENS CAPSULE STUDY;  Surgeon: Margo LITTIE Haddock, MD;  Location: [facility];  Service: Endoscopy;  Laterality: N/A;  7:30   None     Patient Active Problem List   Diagnosis Date Noted   Low back pain 11/25/2021   Hyperlipidemia 04/15/2021   Varicose veins of both legs with edema 02/02/2020   Ocular hypertension 12/05/2019   GERD (gastroesophageal reflux disease) 06/15/2014   Morbid obesity (HCC) 12/18/2013   Uncontrolled type 2 diabetes mellitus with hyperglycemia (HCC) 09/12/2012   Essential hypertension 09/12/2012   Arthritis of knee 02/23/2011    PCP: Cook, Jayce G, DO  REFERRING PROVIDER: Cook, Jayce G, DO  REFERRING DIAG: [phone] (ICD-10-CM) - Chronic left-sided low back pain without sciatica M17.10 (ICD-10-CM) -  Arthritis of knee  Rationale for Evaluation and Treatment: Rehabilitation  THERAPY DIAG:  Other low back pain  Impaired functional mobility, balance, gait, and endurance  Weakness of both lower extremities  ONSET DATE:   SUBJECTIVE:  SUBJECTIVE STATEMENT: Pt states that back pain is not bad today, pt states she just has some stiffness in her hip upon presentation. Pt does not present with cane today due to decreased pain although having a very busy day.    Pt states she ruptured disc about 20 years ago while helping handicapped brother move and make transfers. Pt states the fall on new years eve 2024 on the left side but that the right side is the side that has been bothering her. Pt states she tries to control diabetes with diet, does not like the metformin . Pt states legs feel weak and states she can tell since the fall she has slowed down considerably. Pt states she does have some right knee pain but her main concern is her back and moving faster. Pt would also like to maintain ability to work.   PERTINENT HISTORY:  -Ruptured disc 20 years ago -Clemens new years eve 2024 while going into work -Arthritis causes ankle pain -Laser surgery on legs, 2-3 years ago, still goes  -Diabetes, seeing specialist Friday  PAIN:  Are you having pain? Yes: NPRS scale: 6/10, 8-9/10 at worst Pain location: center of low back, and RLE  Pain description: aching pain, sharp with certain movements Aggravating factors: bending, twisting, lifting heavy weights, walking for a long time Relieving factors: pain patch, rest, sitting down  PRECAUTIONS: Fall  RED FLAGS: None   WEIGHT BEARING RESTRICTIONS: No  FALLS:  Has patient fallen in last 6 months? Yes. 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  DIAGNOSTIC FINDINGS:  CLINICAL DATA:  Clemens down steps landing on left side. Subsequent hip and lower back pain   EXAM: CT LUMBAR SPINE WITHOUT CONTRAST   TECHNIQUE: Multidetector CT imaging of the lumbar spine was performed without intravenous contrast administration. Multiplanar CT image reconstructions were also generated.   RADIATION DOSE REDUCTION: This exam was performed according to the departmental dose-optimization program which includes automated exposure control, adjustment of the mA and/or kV according to patient size and/or use of iterative reconstruction technique.   COMPARISON:  Lumbar spine radiographs 11/17/2011   FINDINGS: Segmentation: 5 lumbar type vertebrae.   Alignment: No evidence of traumatic listhesis.   Vertebrae: Demineralization. No acute fracture or focal pathologic process.   Paraspinal and other soft tissues: Aortic atherosclerotic calcification.   Disc levels: Multilevel spondylosis with bulky bridging anterior osteophytes, disc space height loss, and degenerative endplate changes greatest at L3-L4 and L4-L5. Ankylosis of L4-L5. Multilevel moderate facet arthropathy. Spinal canal narrowing is greatest at L3-L4 where it is moderate secondary to posterior disc osteophyte complex and ligamentum flavum hypertrophy. Neural foraminal narrowing is greatest on the right at L3-L4 heart is moderate to advanced and on the left at L4-L5 where it is advanced.   IMPRESSION: 1.  No acute fracture or evidence of traumatic listhesis. 2. Multilevel spondylosis and facet arthropathy.  PATIENT SURVEYS:  Modified Oswestry 27/50   COGNITION: Overall cognitive status: Within functional limits for tasks assessed     SENSATION: Pt states  she has some neuropathy of both feet from diabetes, no numbness just doesn't feel as good as she used to.  POSTURE: rounded shoulders, forward head, and decreased lumbar lordosis  PALPATION: Pt demonstrates tenderness to palpation to right lumbar paraspinals and L1-L5 spinous processes.   LUMBAR ROM:   AROM eval  Flexion 80 degrees, pain in low back  Extension 7, little discomfort not as bad as going forward   Right lateral flexion Fingers to knee, pain in low back  Left lateral flexion Fingers to knee, pain in low back  Right rotation 25% available, no pain but limited  Left rotation 25% available, no pain but limited   (Blank rows = not tested)  LOWER EXTREMITY ROM:     Active  Right eval Left eval  Hip flexion    Hip extension    Hip abduction    Hip adduction    Hip internal rotation    Hip external rotation    Knee flexion    Knee extension    Ankle dorsiflexion    Ankle plantarflexion    Ankle inversion    Ankle eversion     (Blank rows = not tested)  LOWER EXTREMITY MMT:    MMT Right eval Left eval  Hip flexion 3 3  Hip extension 4 4  Hip abduction 3+ 3+  Hip adduction 3 3  Hip internal rotation    Hip external rotation    Knee flexion 3+ 3+  Knee extension 4 4  Ankle dorsiflexion 4- 4-  Ankle plantarflexion    Ankle inversion    Ankle eversion     (Blank rows = not tested)    FUNCTIONAL TESTS:  5 times sit to stand: 32.11 2 minute walk test: 152 ft  GAIT: Distance walked: about 250 feet throughout session Assistive device utilized: None Level of assistance: Complete Independence Comments: Decreased gait quality and velocity. Pt demonstrates decreased stance time on RLE as two minute  walk test progresses, pt reports due to pain in RLE.  TREATMENT DATE:  12/25/2023  Therapeutic Exercise: -Nustep, level 3 resistance, 5 minutes, pt avgs 75 spm -Supine marching with PPT, 1 set of 10 reps bilaterally -Standing shoulder extensions, 2 sets of 10 reps, pt cued for controlled eccentric phase -3 way kicks, 1 set of 8 reps, GTB at ankles, BUE support -Sled pushes and pulls, 40lbs, 2 laps, 50 foot laps, pr cued for upright posture -Step ups, 1 set of 5 reps, 8 inch step, pt cued for leading LE and one UE -DKTC, on green exercise ball, 3 sets of 10 reps, pt cued for pain free ROM -LTR, 3 set of 10 reps bilaterally, pt cued to remain in pain free ROM -Sit to stands with Tidal tank column, 2 sets of 8 reps, pt cued for core activation, second set with march, pt cued for increased core activation   12/19/2023  Therapeutic Exercise: -Walking bout with SPC around clinic, 5 minutes, pt demonstrates decreased stride length and velocity. -Supine marching with PPT, 1 set of 10 reps bilaterally -Standing shoulder extensions, 2 sets of 10 reps, pt cued for controlled eccentric phase -Lateral stepping 3 laps 14 steps per lap, with GTB around ankles, pt cued for upright posture -Step ups, 1 set of 7 reps, 8 inch step, pt cued for leading LE and one UE -Single knee to chest with green strap for OP, 2 sets of 30 seconds -LTR 2 set of 10 reps bilaterally, pt cued to remain in pain  free ROM -Sit to stands with Tidal tank column, 2 sets of 8 reps, pt cued for core activation, second set with march, pt cued for increased core activation   12/14/2023  Therapeutic Exercise: -Nustep, 5 minutes, level 1 resistance, pt cued for pain free speed -Supine bridges 2 sets of 10 reps, 3 second holds, symptomatic, pt cued for max hip extension -Standing 3 way hip 2 sets 5 reps, bilaterally, pt cued for upright trunk and maintaining of neutral spine -Lateral stepping 3 laps 14 steps per lap, with GTB around  ankles, pt cued for upright posture -Lateral step up and overs, 1 set of 7 reps, pt cued for leading LE and one UE    Neuromuscular Re-education: -DKTC, on green exercise ball, 2 sets of 10 reps, pt cued for pain free ROM -LTR 2 set of 10 reps bilaterally, pt cued to remain in pain free ROM -Sit to stands with 10lb kettle bell, 2 sets of 8 reps, pt cued for core activation                                                                                   PATIENT EDUCATION:  Education details: Pt was educated on findings of PT evaluation, prognosis, frequency of therapy visits and rationale, attendance policy, and HEP if given.   Person educated: Patient Education method: Explanation, Verbal cues, and Handouts Education comprehension: verbalized understanding, verbal cues required, and needs further education  HOME EXERCISE PROGRAM: Access Code: [email or web address] URL: [email or web address] Date: 11/14/2023 Prepared by: Lang Ada  Exercises - Supine Lower Trunk Rotation  - 1 x daily - 7 x weekly - 3 sets - 10 reps - Sit to Stand with Armchair  - 1 x daily - 7 x weekly - 3 sets - 10 reps   Access Code: [email or web address] URL: [email or web address] Date: 12/05/2023 Prepared by: Rosaria Powell-Butler  Exercises - Hooklying Clamshell with Resistance  - 2 x daily - 7 x weekly - 3 sets - 10 reps - Supine Hip Adduction Isometric with Ball  - 2 x daily - 7 x weekly - 3 sets - 10 reps - 5 hold - Supine Bridge  - 2 x daily - 7 x weekly - 3 sets - 10 reps  ASSESSMENT:  CLINICAL IMPRESSION: Patient continues to demonstrate decreased core/LE strength, decreased gait quality and functional mobility. Patient also demonstrates decreased lying tolerance with need for semi reclined positioning. Patient able to progress dynamic balance and core activation exercises today with sled pushes/pulls, good performance with verbal cueing. Patient would continue to benefit from skilled physical  therapy for increased endurance with ambulation, increased core/LE strength, and improved functional mobility for improved quality of life, improved independence with gait training and continued progress towards therapy goals.      EVAL: Patient is a 67 y.o. female who was seen today for physical therapy evaluation and treatment for M54.50,G89.29 (ICD-10-CM) - Chronic left-sided low back pain without sciatica M17.10 (ICD-10-CM) - Arthritis of knee.  Patient demonstrates right sided low back pain, RLE pain with ambulation, decreased LE strength, and impaired balance. Patient also demonstrates difficulty with ambulation during today's session with decreased RLE stance time, BLE stride length  and decreased velocity noted. Patient also demonstrates decreased ability to perform STS without BUE support at this time. Patient requires education on role of PT, POC, and various techniques involved with treatment of low back. Patient would benefit from skilled physical therapy for decreased low back pain, increased endurance with ambulation, increased LE strength, and balance for improved gait quality, return to higher level of function with ADLs, and progress towards therapy goals.   OBJECTIVE IMPAIRMENTS: Abnormal gait, decreased activity tolerance, decreased balance, decreased endurance, decreased mobility, difficulty walking, decreased strength, impaired sensation, obesity, and pain.   ACTIVITY LIMITATIONS: carrying, lifting, bending, standing, squatting, stairs, transfers, bed mobility, and dressing  PARTICIPATION LIMITATIONS: meal prep, cleaning, laundry, driving, community activity, occupation, and yard work  PERSONAL FACTORS: Fitness, Past/current experiences, Time since onset of injury/illness/exacerbation, and 1-2 comorbidities: diabetes, working 10 hour shifts in which she is required to stand for 9/10 hours are also affecting patient's functional outcome.   REHAB POTENTIAL: Good  CLINICAL  DECISION MAKING: Evolving/moderate complexity  EVALUATION COMPLEXITY: Low   GOALS: Goals reviewed with patient? Yes  SHORT TERM GOALS: Target date: 12/05/23  Pt will be independent with HEP in order to demonstrate participation in Physical Therapy POC.  Baseline: Goal status: INITIAL  2.  Pt will report 6/10 pain at worst with mobility in order to demonstrate improved pain with ADLs.  Baseline: 8-9/10 Goal status: INITIAL  LONG TERM GOALS: Target date: 12/26/23  Pt will improve 5TSTS by 2.3 seconds in order to demonstrate improved functional strength to return to desired activities.  Baseline: see objective.  Goal status: INITIAL  2.  Pt will improve 2 MWT by 140 in order to demonstrate improved functional ambulatory capacity in community setting.  Baseline: see objective.  Goal status: INITIAL  3.  Pt will improve Modified Oswestry score by 6 in order to demonstrate improved pain with functional goals and outcomes. Baseline: see objective.  Goal status: INITIAL  4.  Pt will report 4/10 pain with mobility in order to demonstrate reduced pain with ADLs lasting greater than 30 minutes.  Baseline: see objective.  Goal status: INITIAL   PLAN:  PT FREQUENCY: 2x/week  PT DURATION: 6 weeks  PLANNED INTERVENTIONS: 97110-Therapeutic exercises, 97530- Therapeutic activity, 97112- Neuromuscular re-education, 97535- Self Care, [id number]- Manual therapy, [phone]- Gait training, Patient/Family education, Balance training, Stair training, Joint mobilization, Spinal mobilization, Manual lymph drainage, DME instructions, Cryotherapy, and Moist heat.  PLAN FOR NEXT SESSION: Reassess, encourage walking program, progress core and LE strengthening as well as endurance training. Pt would like to add 3 missed session to the end of POC.   Lang Ada, PT, DPT [facility] Office: [phone] 5:41 PM, 12/25/23

## 2023-12-26 NOTE — Progress Notes (Signed)
   12/26/2023  Patient ID: Tracey Turner, female   DOB: 1955-09-28, 68 y.o.   MRN: [id number]  Patient appearing on [place] DM report, last A1c >8%. Was recently started on ozempic  by endocrinology. Being closely followed by PCP and has f/u scheduled with endocrinology. No gaps in new start ozempic . Will set reminder to check in in 3 months.   Lab Results  Component Value Date   HGBA1C 9.0 (H) 10/12/2023   HGBA1C 7.9 (H) 07/10/2023   HGBA1C 8.5 (H) 12/15/2022   HGBA1C 8.2 (H) 08/31/2022   HGBA1C 7.7 (H) 03/02/2022   HGBA1C 7.8 (H) 11/25/2021   HGBA1C 7.7 (H) 08/10/2021   HGBA1C 8.8 (H) 02/28/2021   HGBA1C 7.1 (A) 06/08/2020   HGBA1C 7.8 (A) 12/05/2019   HGBA1C 7.2 (A) 12/06/2018   HGBA1C 7.5 (A) 04/30/2018   HGBA1C 7.5 08/27/2017   HGBA1C 7.8 08/14/2016   HGBA1C 8.0 04/05/2016   HGBA1C 7.1 08/24/2015   HGBA1C 6.9 12/25/2014   HGBA1C 8.2 06/15/2014   HGBA1C 7.7 03/13/2014   HGBA1C 7.2 09/22/2013   Lang Sieve, PharmD, BCGP Clinical Pharmacist  [phone]

## 2023-12-28 ENCOUNTER — Ambulatory Visit (HOSPITAL_COMMUNITY)

## 2023-12-28 ENCOUNTER — Encounter (HOSPITAL_COMMUNITY): Payer: Self-pay

## 2023-12-28 DIAGNOSIS — R29898 Other symptoms and signs involving the musculoskeletal system: Secondary | ICD-10-CM

## 2023-12-28 DIAGNOSIS — Z7409 Other reduced mobility: Secondary | ICD-10-CM

## 2023-12-28 DIAGNOSIS — M5459 Other low back pain: Secondary | ICD-10-CM | POA: Diagnosis not present

## 2023-12-28 NOTE — Therapy (Signed)
 OUTPATIENT PHYSICAL THERAPY THORACOLUMBAR TREATMENT/PROGRESS NOTE  Progress Note Reporting Period 11/14/23 to 12/28/23  See note below for Objective Data and Assessment of Progress/Goals.     Patient Name: Tracey Turner MRN: [id number] DOB:06-Jul-1955, 68 y.o., female Today's Date: 12/28/2023  END OF SESSION:  PT End of Session - 12/28/23 1300     Visit Number 7    Number of Visits 12    Date for PT Re-Evaluation 12/26/23    Authorization Type [employer] MEDICARE    Authorization Time Period [facility] approved 12 visits from 11/19/23-12/31/23    Authorization - Visit Number 6    Authorization - Number of Visits 12    Progress Note Due on Visit 10    PT Start Time 1300    PT Stop Time 1340    PT Time Calculation (min) 40 min    Activity Tolerance Patient tolerated treatment well;Patient limited by pain    Behavior During Therapy [facility] for tasks assessed/performed              Past Medical History:  Diagnosis Date   Anemia    Arthritis    Back pain    Gastritis    Glaucoma    Hypertension    Knee pain    Type 2 diabetes mellitus (HCC)    Past Surgical History:  Procedure Laterality Date   COLONOSCOPY  02/13/2011   internal hemorrhoids   ESOPHAGOGASTRODUODENOSCOPY  02/13/2011   mild gatritis   GIVENS CAPSULE STUDY  03/28/2012       GIVENS CAPSULE STUDY  03/26/2012   Procedure: GIVENS CAPSULE STUDY;  Surgeon: Margo LITTIE Haddock, MD;  Location: [facility];  Service: Endoscopy;  Laterality: N/A;  7:30   None     Patient Active Problem List   Diagnosis Date Noted   Low back pain 11/25/2021   Hyperlipidemia 04/15/2021   Varicose veins of both legs with edema 02/02/2020   Ocular hypertension 12/05/2019   GERD (gastroesophageal reflux disease) 06/15/2014   Morbid obesity (HCC) 12/18/2013   Uncontrolled type 2 diabetes mellitus with hyperglycemia (HCC) 09/12/2012   Essential hypertension 09/12/2012   Arthritis of knee 02/23/2011    PCP: Cook, Jayce G, DO  REFERRING  PROVIDER: Cook, Jayce G, DO  REFERRING DIAG: [phone] (ICD-10-CM) - Chronic left-sided low back pain without sciatica M17.10 (ICD-10-CM) - Arthritis of knee  Rationale for Evaluation and Treatment: Rehabilitation  THERAPY DIAG:  Other low back pain  Impaired functional mobility, balance, gait, and endurance  Weakness of both lower extremities  ONSET DATE:   SUBJECTIVE:  SUBJECTIVE STATEMENT: Pt states that back pain is not bad today, 3/10. Pt states she has been getting up easier and presents without the cane today. Pt states she has done HEP 2 times in the last week.    Pt states she ruptured disc about 20 years ago while helping handicapped brother move and make transfers. Pt states the fall on new years eve 2024 on the left side but that the right side is the side that has been bothering her. Pt states she tries to control diabetes with diet, does not like the metformin . Pt states legs feel weak and states she can tell since the fall she has slowed down considerably. Pt states she does have some right knee pain but her main concern is her back and moving faster. Pt would also like to maintain ability to work.   PERTINENT HISTORY:  -Ruptured disc 20 years ago -Clemens new years eve 2024 while going into work -Arthritis causes ankle pain -Laser surgery on legs, 2-3 years ago, still goes  -Diabetes, seeing specialist Friday  PAIN:  Are you having pain? Yes: NPRS scale: 6/10, 8-9/10 at worst Pain location: center of low back, and RLE  Pain description: aching pain, sharp with certain movements Aggravating factors: bending, twisting, lifting heavy weights, walking for a long time Relieving factors: pain patch, rest, sitting down  PRECAUTIONS: Fall  RED FLAGS: None   WEIGHT BEARING  RESTRICTIONS: No  FALLS:  Has patient fallen in last 6 months? Yes. 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  DIAGNOSTIC FINDINGS:  CLINICAL DATA:  Clemens down steps landing on left side. Subsequent hip and lower back pain   EXAM: CT LUMBAR SPINE WITHOUT CONTRAST   TECHNIQUE: Multidetector CT imaging of the lumbar spine was performed without intravenous contrast administration. Multiplanar CT image reconstructions were also generated.   RADIATION DOSE REDUCTION: This exam was performed according to the departmental dose-optimization program which includes automated exposure control, adjustment of the mA and/or kV according to patient size and/or use of iterative reconstruction technique.   COMPARISON:  Lumbar spine radiographs 11/17/2011   FINDINGS: Segmentation: 5 lumbar type vertebrae.   Alignment: No evidence of traumatic listhesis.   Vertebrae: Demineralization. No acute fracture or focal pathologic process.   Paraspinal and other soft tissues: Aortic atherosclerotic calcification.   Disc levels: Multilevel spondylosis with bulky bridging anterior osteophytes, disc space height loss, and degenerative endplate changes greatest at L3-L4 and L4-L5. Ankylosis of L4-L5. Multilevel moderate facet arthropathy. Spinal canal narrowing is greatest at L3-L4 where it is moderate secondary to posterior disc osteophyte complex and ligamentum flavum hypertrophy. Neural  foraminal narrowing is greatest on the right at L3-L4 heart is moderate to advanced and on the left at L4-L5 where it is advanced.   IMPRESSION: 1. No acute fracture or evidence of traumatic listhesis. 2. Multilevel spondylosis and facet arthropathy.  PATIENT SURVEYS:  Modified Oswestry 27/50  12/28/23: 10/50 COGNITION: Overall cognitive status: Within functional limits for tasks assessed     SENSATION: Pt states she  has some neuropathy of both feet from diabetes, no numbness just doesn't feel as good as she used to.  POSTURE: rounded shoulders, forward head, and decreased lumbar lordosis  PALPATION: Pt demonstrates tenderness to palpation to right lumbar paraspinals and L1-L5 spinous processes.   LUMBAR ROM:   AROM eval  Flexion 80 degrees, pain in low back  Extension 7, little discomfort not as bad as going forward   Right lateral flexion Fingers to knee, pain in low back  Left lateral flexion Fingers to knee, pain in low back  Right rotation 25% available, no pain but limited  Left rotation 25% available, no pain but limited   (Blank rows = not tested)  LOWER EXTREMITY ROM:     Active  Right eval Left eval  Hip flexion    Hip extension    Hip abduction    Hip adduction    Hip internal rotation    Hip external rotation    Knee flexion    Knee extension    Ankle dorsiflexion    Ankle plantarflexion    Ankle inversion    Ankle eversion     (Blank rows = not tested)  LOWER EXTREMITY MMT:    MMT Right eval Left eval  Hip flexion 3 3  Hip extension 4 4  Hip abduction 3+ 3+  Hip adduction 3 3  Hip internal rotation    Hip external rotation    Knee flexion 3+ 3+  Knee extension 4 4  Ankle dorsiflexion 4- 4-  Ankle plantarflexion    Ankle inversion    Ankle eversion     (Blank rows = not tested)    FUNCTIONAL TESTS:  5 times sit to stand: 32.11 2 minute walk test: 152 ft 12/28/23: : 238 ft 5TSTS: 25.81 seconds  GAIT: Distance walked:  about 250 feet throughout session Assistive device utilized: None Level of assistance: Complete Independence Comments: Decreased gait quality and velocity. Pt demonstrates decreased stance time on RLE as two minute walk test progresses, pt reports due to pain in RLE.  TREATMENT DATE:  12/28/2023  Progress note: , Modified Oswestry, 5TSTS Therapeutic Exercise: - -Standing shoulder extensions, 2 sets of 10 reps, pt cued for controlled eccentric phase -3 way kicks, 1 set of 8 reps, 3 pound weights at ankles, BUE support -Ankle weight walks, 3 pounds, 2 laps, walking marches/buttkicks  Therapeutic Activity: -Sit to stands, 2 sets of 10 reps, pt cued for core activation -Sled pushes and pulls, 40lbs, 2 laps, 50 foot laps, pt cued for upright posture -Stair navigation, 4 bouts, 4 stairs, pt cued for one foot per stair, pt requires BUE support    12/25/2023  Therapeutic Exercise: -Nustep, level 3 resistance, 5 minutes, pt avgs 75 spm -Supine marching with PPT, 1 set of 10 reps bilaterally -Standing shoulder extensions, 2 sets of 10 reps, pt cued for controlled eccentric phase -3 way kicks, 1 set of 8 reps, GTB at ankles, BUE support -Sled pushes and pulls, 40lbs, 2 laps, 50 foot laps, pr cued for upright posture -Step ups, 1 set of 5 reps, 8 inch step, pt cued for leading LE and one UE -DKTC, on green exercise ball, 3 sets of 10 reps, pt cued for pain free ROM -LTR, 3 set of 10 reps bilaterally, pt cued to remain in pain free ROM -Sit to stands with Tidal tank column, 2 sets of 8 reps, pt cued for core activation, second set with march, pt cued for increased core activation  12/19/2023  Therapeutic Exercise: -Walking bout with SPC around clinic, 5 minutes, pt demonstrates decreased stride length and velocity. -Supine marching with PPT, 1 set of 10 reps bilaterally -Standing shoulder extensions, 2 sets of 10 reps, pt cued for controlled eccentric phase -Lateral stepping 3 laps 14  steps per lap, with GTB around ankles, pt cued for upright posture -Step ups, 1 set of 7 reps, 8 inch step, pt cued for leading LE and one UE -Single knee to chest with green strap for OP, 2 sets of 30 seconds -LTR 2 set of 10 reps bilaterally, pt cued to remain in pain free ROM -Sit to stands with Tidal tank column, 2 sets of 8 reps, pt cued for core activation, second set with march, pt cued for increased core activation   PATIENT EDUCATION:  Education details: Pt was educated on findings of PT evaluation, prognosis, frequency of therapy visits and rationale, attendance policy, and HEP if given.   Person educated: Patient Education method: Explanation, Verbal cues, and Handouts Education comprehension: verbalized understanding, verbal cues required, and needs further education  HOME EXERCISE PROGRAM: Access Code: [email or web address] URL: [email or web address] Date: 11/14/2023 Prepared by: Lang Ada  Exercises - Supine Lower Trunk Rotation  - 1 x daily - 7 x weekly - 3 sets - 10 reps - Sit to Stand with Armchair  - 1 x daily - 7 x weekly - 3 sets - 10 reps   Access Code: [email or web address] URL: [email or web address] Date: 12/05/2023 Prepared by: Rosaria Powell-Butler  Exercises - Hooklying Clamshell with Resistance  - 2 x daily - 7 x weekly - 3 sets - 10 reps - Supine Hip Adduction Isometric with Ball  - 2 x daily - 7 x weekly - 3 sets - 10 reps - 5 hold - Supine Bridge  - 2 x daily - 7 x weekly - 3 sets - 10 reps  ASSESSMENT:  CLINICAL IMPRESSION: Patient continues to demonstrate decreased core/LE strength, decreased gait quality and functional mobility. Progress note performed today for goal tracking and updated POC, plan to discharge after next two visits pending low back pain stays at diminished level. Patient able to continue dynamic balance and core activation exercises today with sled pushes/pulls, good performance with verbal cueing. Pt has met 4/6 initial  therapy goals and demonstrates good progress towards remaining goals. Patient would continue to benefit from skilled physical therapy for increased endurance with ambulation, increased core/LE strength, and improved functional mobility for improved quality of life, improved independence with gait training and continued progress towards therapy goals.      EVAL: Patient is a 68 y.o. female who was seen today for physical therapy evaluation and treatment for M54.50,G89.29 (ICD-10-CM) - Chronic left-sided low back pain without sciatica M17.10 (ICD-10-CM) - Arthritis of knee.  Patient demonstrates right sided low back pain, RLE pain with ambulation, decreased LE strength, and impaired balance. Patient also demonstrates difficulty with ambulation during today's session with decreased RLE stance time, BLE stride length and decreased velocity noted. Patient also demonstrates decreased ability to perform STS without BUE support at this time. Patient requires education on role of PT, POC, and various techniques involved with treatment of low back. Patient would benefit from skilled physical therapy for decreased low back pain, increased endurance with ambulation, increased LE strength, and balance for improved gait quality, return to higher level of function with ADLs, and progress towards therapy goals.   OBJECTIVE IMPAIRMENTS: Abnormal gait, decreased activity tolerance, decreased balance, decreased endurance, decreased mobility, difficulty walking,  decreased strength, impaired sensation, obesity, and pain.   ACTIVITY LIMITATIONS: carrying, lifting, bending, standing, squatting, stairs, transfers, bed mobility, and dressing  PARTICIPATION LIMITATIONS: meal prep, cleaning, laundry, driving, community activity, occupation, and yard work  PERSONAL FACTORS: Fitness, Past/current experiences, Time since onset of injury/illness/exacerbation, and 1-2 comorbidities: diabetes, working 10 hour shifts in which she is  required to stand for 9/10 hours are also affecting patient's functional outcome.   REHAB POTENTIAL: Good  CLINICAL DECISION MAKING: Evolving/moderate complexity  EVALUATION COMPLEXITY: Low   GOALS: Goals reviewed with patient? Yes  SHORT TERM GOALS: Target date: 12/05/23  Pt will be independent with HEP in order to demonstrate participation in Physical Therapy POC.  Baseline: Goal status: IN PROGRESS  2.  Pt will report 6/10 pain at worst with mobility in order to demonstrate improved pain with ADLs.  Baseline: 8-9/10 Goal status: MET  LONG TERM GOALS: Target date: 12/26/23  Pt will improve 5TSTS by 2.3 seconds in order to demonstrate improved functional strength to return to desired activities.  Baseline: see objective.  Goal status: MET  2.  Pt will improve 2 MWT by 140 in order to demonstrate improved functional ambulatory capacity in community setting.  Baseline: see objective.  Goal status: IN PROGRESS  3.  Pt will improve Modified Oswestry score by 6 in order to demonstrate improved pain with functional goals and outcomes. Baseline: see objective.  Goal status: MET  4.  Pt will report 4/10 pain with mobility in order to demonstrate reduced pain with ADLs lasting greater than 30 minutes.  Baseline: see objective.  Goal status: MET   PLAN:  PT FREQUENCY: 2x/week  PT DURATION: 6 weeks  PLANNED INTERVENTIONS: 97110-Therapeutic exercises, 97530- Therapeutic activity, 97112- Neuromuscular re-education, 97535- Self Care, [id number]- Manual therapy, [id number]- Gait training, Patient/Family education, Balance training, Stair training, Joint mobilization, Spinal mobilization, Manual lymph drainage, DME instructions, Cryotherapy, and Moist heat.  PLAN FOR NEXT SESSION: discharge 7/18   Lang Ada, PT, DPT [facility] Office: [phone] 1:48 PM, 12/28/23

## 2024-01-01 ENCOUNTER — Encounter (HOSPITAL_COMMUNITY): Payer: Self-pay

## 2024-01-01 ENCOUNTER — Ambulatory Visit (HOSPITAL_COMMUNITY)

## 2024-01-01 DIAGNOSIS — Z7409 Other reduced mobility: Secondary | ICD-10-CM | POA: Diagnosis not present

## 2024-01-01 DIAGNOSIS — M5459 Other low back pain: Secondary | ICD-10-CM | POA: Diagnosis not present

## 2024-01-01 DIAGNOSIS — R29898 Other symptoms and signs involving the musculoskeletal system: Secondary | ICD-10-CM | POA: Diagnosis not present

## 2024-01-01 NOTE — Therapy (Signed)
 OUTPATIENT PHYSICAL THERAPY THORACOLUMBAR TREATMENT/PROGRESS NOTE  Progress Note Reporting Period 11/14/23 to 12/28/23  See note below for Objective Data and Assessment of Progress/Goals.     Patient Name: Tracey Turner MRN: [id number] DOB:[birth year], 68 y.o., female Today's Date: 01/01/2024  END OF SESSION:  PT End of Session - 01/01/24 1650     Visit Number 8    Number of Visits 12    Date for PT Re-Evaluation 12/26/23    Authorization Type [employer] MEDICARE    Authorization Time Period [facility] approved 12 visits from 11/19/23-12/31/23    Authorization - Visit Number 7    Authorization - Number of Visits 12    Progress Note Due on Visit 10    PT Start Time 1651    PT Stop Time 1730    PT Time Calculation (min) 39 min    Activity Tolerance Patient tolerated treatment well;Patient limited by pain    Behavior During Therapy [facility] for tasks assessed/performed               Past Medical History:  Diagnosis Date   Anemia    Arthritis    Back pain    Gastritis    Glaucoma    Hypertension    Knee pain    Type 2 diabetes mellitus (HCC)    Past Surgical History:  Procedure Laterality Date   COLONOSCOPY  02/13/2011   internal hemorrhoids   ESOPHAGOGASTRODUODENOSCOPY  02/13/2011   mild gatritis   GIVENS CAPSULE STUDY  03/28/2012       GIVENS CAPSULE STUDY  03/26/2012   Procedure: GIVENS CAPSULE STUDY;  Surgeon: Margo LITTIE Haddock, MD;  Location: [facility];  Service: Endoscopy;  Laterality: N/A;  7:30   None     Patient Active Problem List   Diagnosis Date Noted   Low back pain 11/25/2021   Hyperlipidemia 04/15/2021   Varicose veins of both legs with edema 02/02/2020   Ocular hypertension 12/05/2019   GERD (gastroesophageal reflux disease) 06/15/2014   Morbid obesity (HCC) 12/18/2013   Uncontrolled type 2 diabetes mellitus with hyperglycemia (HCC) 09/12/2012   Essential hypertension 09/12/2012   Arthritis of knee 02/23/2011    PCP: Cook, Jayce G, DO  REFERRING  PROVIDER: Cook, Jayce G, DO  REFERRING DIAG: [phone] (ICD-10-CM) - Chronic left-sided low back pain without sciatica M17.10 (ICD-10-CM) - Arthritis of knee  Rationale for Evaluation and Treatment: Rehabilitation  THERAPY DIAG:  Other low back pain  Impaired functional mobility, balance, gait, and endurance  Weakness of both lower extremities  ONSET DATE:   SUBJECTIVE:  SUBJECTIVE STATEMENT: Pt states that back pain is not bad today, 3/10. Pt states she has been getting up easier and presents without the cane today. Pt states she has done HEP 2 times in the last week.    Pt states she ruptured disc about 20 years ago while helping handicapped brother move and make transfers. Pt states the fall on new years eve 2024 on the left side but that the right side is the side that has been bothering her. Pt states she tries to control diabetes with diet, does not like the metformin . Pt states legs feel weak and states she can tell since the fall she has slowed down considerably. Pt states she does have some right knee pain but her main concern is her back and moving faster. Pt would also like to maintain ability to work.   PERTINENT HISTORY:  -Ruptured disc 20 years ago -Clemens new years eve 2024 while going into work -Arthritis causes ankle pain -Laser surgery on legs, 2-3 years ago, still goes  -Diabetes, seeing specialist Friday  PAIN:  Are you having pain? Yes: NPRS scale: 6/10, 8-9/10 at worst Pain location: center of low back, and RLE  Pain description: aching pain, sharp with certain movements Aggravating factors: bending, twisting, lifting heavy weights, walking for a long time Relieving factors: pain patch, rest, sitting down  PRECAUTIONS: Fall  RED FLAGS: None   WEIGHT BEARING  RESTRICTIONS: No  FALLS:  Has patient fallen in last 6 months? Yes. 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  DIAGNOSTIC FINDINGS:  CLINICAL DATA:  Clemens down steps landing on left side. Subsequent hip and lower back pain   EXAM: CT LUMBAR SPINE WITHOUT CONTRAST   TECHNIQUE: Multidetector CT imaging of the lumbar spine was performed without intravenous contrast administration. Multiplanar CT image reconstructions were also generated.   RADIATION DOSE REDUCTION: This exam was performed according to the departmental dose-optimization program which includes automated exposure control, adjustment of the mA and/or kV according to patient size and/or use of iterative reconstruction technique.   COMPARISON:  Lumbar spine radiographs 11/17/2011   FINDINGS: Segmentation: 5 lumbar type vertebrae.   Alignment: No evidence of traumatic listhesis.   Vertebrae: Demineralization. No acute fracture or focal pathologic process.   Paraspinal and other soft tissues: Aortic atherosclerotic calcification.   Disc levels: Multilevel spondylosis with bulky bridging anterior osteophytes, disc space height loss, and degenerative endplate changes greatest at L3-L4 and L4-L5. Ankylosis of L4-L5. Multilevel moderate facet arthropathy. Spinal canal narrowing is greatest at L3-L4 where it is moderate secondary to posterior disc osteophyte complex and ligamentum flavum hypertrophy. Neural  foraminal narrowing is greatest on the right at L3-L4 heart is moderate to advanced and on the left at L4-L5 where it is advanced.   IMPRESSION: 1. No acute fracture or evidence of traumatic listhesis. 2. Multilevel spondylosis and facet arthropathy.  PATIENT SURVEYS:  Modified Oswestry 27/50  12/28/23: 10/50 COGNITION: Overall cognitive status: Within functional limits for tasks assessed     SENSATION: Pt states she  has some neuropathy of both feet from diabetes, no numbness just doesn't feel as good as she used to.  POSTURE: rounded shoulders, forward head, and decreased lumbar lordosis  PALPATION: Pt demonstrates tenderness to palpation to right lumbar paraspinals and L1-L5 spinous processes.   LUMBAR ROM:   AROM eval  Flexion 80 degrees, pain in low back  Extension 7, little discomfort not as bad as going forward   Right lateral flexion Fingers to knee, pain in low back  Left lateral flexion Fingers to knee, pain in low back  Right rotation 25% available, no pain but limited  Left rotation 25% available, no pain but limited   (Blank rows = not tested)  LOWER EXTREMITY ROM:     Active  Right eval Left eval  Hip flexion    Hip extension    Hip abduction    Hip adduction    Hip internal rotation    Hip external rotation    Knee flexion    Knee extension    Ankle dorsiflexion    Ankle plantarflexion    Ankle inversion    Ankle eversion     (Blank rows = not tested)  LOWER EXTREMITY MMT:    MMT Right eval Left eval  Hip flexion 3 3  Hip extension 4 4  Hip abduction 3+ 3+  Hip adduction 3 3  Hip internal rotation    Hip external rotation    Knee flexion 3+ 3+  Knee extension 4 4  Ankle dorsiflexion 4- 4-  Ankle plantarflexion    Ankle inversion    Ankle eversion     (Blank rows = not tested)    FUNCTIONAL TESTS:  5 times sit to stand: 32.11 2 minute walk test: 152 ft 12/28/23: : 238 ft 5TSTS: 25.81 seconds  GAIT: Distance walked:  about 250 feet throughout session Assistive device utilized: None Level of assistance: Complete Independence Comments: Decreased gait quality and velocity. Pt demonstrates decreased stance time on RLE as two minute walk test progresses, pt reports due to pain in RLE.  TREATMENT DATE:  01/01/2024  Therapeutic Exercise: -Walking 5 minutes around clinic, pt consistently reaching for wall, pt states more strain in side of hips than the back -Single knee to chest, 1 set of 10 reps, 5 second holds, bilaterally, with green strap, RLE decreased knee flexion noted -Supine bridges, 2 sets of 10 reps, 3 second holds, GTB at knees, pt cued for max hip extension -Modified LTR for hip activation, IR tight bilaterally, 2 sets of 15 reps -Step ups, 1 set of 5 reps, 8 inch step, pt cued for leading LE and one UE -Sit to stands with 5lb and 10lb dumbbell, 2 sets of 5 reps, pt cued for core activation, pt cued for increased core activation  12/28/2023  Progress note: , Modified Oswestry, 5TSTS Therapeutic Exercise: - -Standing shoulder extensions, 2 sets of 10 reps, pt cued for controlled eccentric phase -3 way kicks, 1 set of 8 reps, 3 pound weights at ankles, BUE support -Ankle weight walks, 3 pounds, 2 laps, walking marches/buttkicks  Therapeutic Activity: -Sit to stands, 2 sets of 10 reps, pt cued for core activation -Sled pushes and pulls, 40lbs, 2 laps, 50 foot laps, pt cued for upright posture -Stair navigation, 4 bouts, 4 stairs, pt cued for one foot per stair, pt requires BUE support    12/25/2023  Therapeutic Exercise: -Nustep, level 3 resistance, 5 minutes, pt avgs 75 spm -Supine marching with PPT, 1 set of 10  reps bilaterally -Standing shoulder extensions, 2 sets of 10 reps, pt cued for controlled eccentric phase -3 way kicks, 1 set of 8 reps, GTB at ankles, BUE support -Sled pushes and pulls, 40lbs, 2 laps, 50 foot laps, pr cued for upright posture -Step ups, 1 set of 5 reps, 8  inch step, pt cued for leading LE and one UE -DKTC, on green exercise ball, 3 sets of 10 reps, pt cued for pain free ROM -LTR, 3 set of 10 reps bilaterally, pt cued to remain in pain free ROM -Sit to stands with Tidal tank column, 2 sets of 8 reps, pt cued for core activation, second set with march, pt cued for increased core activation  PATIENT EDUCATION:  Education details: Pt was educated on findings of PT evaluation, prognosis, frequency of therapy visits and rationale, attendance policy, and HEP if given.   Person educated: Patient Education method: Explanation, Verbal cues, and Handouts Education comprehension: verbalized understanding, verbal cues required, and needs further education  HOME EXERCISE PROGRAM: Access Code: [email or web address] URL: [email or web address] Date: 11/14/2023 Prepared by: Lang Ada  Exercises - Supine Lower Trunk Rotation  - 1 x daily - 7 x weekly - 3 sets - 10 reps - Sit to Stand with Armchair  - 1 x daily - 7 x weekly - 3 sets - 10 reps   Access Code: [email or web address] URL: [email or web address] Date: 12/05/2023 Prepared by: Rosaria Powell-Butler  Exercises - Hooklying Clamshell with Resistance  - 2 x daily - 7 x weekly - 3 sets - 10 reps - Supine Hip Adduction Isometric with Ball  - 2 x daily - 7 x weekly - 3 sets - 10 reps - 5 hold - Supine Bridge  - 2 x daily - 7 x weekly - 3 sets - 10 reps  ASSESSMENT:  CLINICAL IMPRESSION: Patient continues to demonstrate increased core/LE strength, increased gait quality and functional mobility. Pt demonstrates increased activity tolerance with 5 minutes of walking to start session with no AD. Patient able to continue dynamic balance and core activation exercises today with supine bridges and STS variations, good performance with verbal cueing. Patient would continue to benefit from skilled physical therapy for increased endurance with ambulation, increased core/LE strength, and improved functional  mobility for improved quality of life, improved independence with gait training and continued progress towards therapy goals.      EVAL: Patient is a 68 y.o. female who was seen today for physical therapy evaluation and treatment for M54.50,G89.29 (ICD-10-CM) - Chronic left-sided low back pain without sciatica M17.10 (ICD-10-CM) - Arthritis of knee.  Patient demonstrates right sided low back pain, RLE pain with ambulation, decreased LE strength, and impaired balance. Patient also demonstrates difficulty with ambulation during today's session with decreased RLE stance time, BLE stride length and decreased velocity noted. Patient also demonstrates decreased ability to perform STS without BUE support at this time. Patient requires education on role of PT, POC, and various techniques involved with treatment of low back. Patient would benefit from skilled physical therapy for decreased low back pain, increased endurance with ambulation, increased LE strength, and balance for improved gait quality, return to higher level of function with ADLs, and progress towards therapy goals.   OBJECTIVE IMPAIRMENTS: Abnormal gait, decreased activity tolerance, decreased balance, decreased endurance, decreased mobility, difficulty walking, decreased strength, impaired sensation, obesity, and pain.   ACTIVITY LIMITATIONS: carrying, lifting, bending, standing, squatting, stairs, transfers, bed mobility, and dressing  PARTICIPATION LIMITATIONS: meal prep, cleaning, laundry, driving, community activity, occupation, and yard  work  PERSONAL FACTORS: Fitness, Past/current experiences, Time since onset of injury/illness/exacerbation, and 1-2 comorbidities: diabetes, working 10 hour shifts in which she is required to stand for 9/10 hours are also affecting patient's functional outcome.   REHAB POTENTIAL: Good  CLINICAL DECISION MAKING: Evolving/moderate complexity  EVALUATION COMPLEXITY: Low   GOALS: Goals reviewed  with patient? Yes  SHORT TERM GOALS: Target date: 12/05/23  Pt will be independent with HEP in order to demonstrate participation in Physical Therapy POC.  Baseline: Goal status: IN PROGRESS  2.  Pt will report 6/10 pain at worst with mobility in order to demonstrate improved pain with ADLs.  Baseline: 8-9/10 Goal status: MET  LONG TERM GOALS: Target date: 12/26/23  Pt will improve 5TSTS by 2.3 seconds in order to demonstrate improved functional strength to return to desired activities.  Baseline: see objective.  Goal status: MET  2.  Pt will improve 2 MWT by 140 in order to demonstrate improved functional ambulatory capacity in community setting.  Baseline: see objective.  Goal status: IN PROGRESS  3.  Pt will improve Modified Oswestry score by 6 in order to demonstrate improved pain with functional goals and outcomes. Baseline: see objective.  Goal status: MET  4.  Pt will report 4/10 pain with mobility in order to demonstrate reduced pain with ADLs lasting greater than 30 minutes.  Baseline: see objective.  Goal status: MET   PLAN:  PT FREQUENCY: 2x/week  PT DURATION: 6 weeks  PLANNED INTERVENTIONS: 97110-Therapeutic exercises, 97530- Therapeutic activity, 97112- Neuromuscular re-education, 97535- Self Care, [id number]- Manual therapy, [phone]- Gait training, Patient/Family education, Balance training, Stair training, Joint mobilization, Spinal mobilization, Manual lymph drainage, DME instructions, Cryotherapy, and Moist heat.  PLAN FOR NEXT SESSION: discharge 7/18 pending LBP remains diminished   Lang Ada, PT, DPT [facility] Office: [phone] 5:36 PM, 01/01/24

## 2024-01-04 ENCOUNTER — Ambulatory Visit (HOSPITAL_COMMUNITY)

## 2024-01-04 ENCOUNTER — Encounter (HOSPITAL_COMMUNITY): Payer: Self-pay

## 2024-01-04 DIAGNOSIS — M5459 Other low back pain: Secondary | ICD-10-CM

## 2024-01-04 DIAGNOSIS — R29898 Other symptoms and signs involving the musculoskeletal system: Secondary | ICD-10-CM | POA: Diagnosis not present

## 2024-01-04 DIAGNOSIS — Z7409 Other reduced mobility: Secondary | ICD-10-CM

## 2024-01-04 NOTE — Therapy (Signed)
 OUTPATIENT PHYSICAL THERAPY THORACOLUMBAR TREATMENT/DISCHARGE PHYSICAL THERAPY DISCHARGE SUMMARY  Visits from Start of Care: 8  Current functional level related to goals / functional outcomes: WFL   Remaining deficits: Ambulation endurance deficit   Education / Equipment: Educated on HEP and compliance, encouraged to get involved with regular exercise.    Patient agrees to discharge. Patient goals were partially met. Patient is being discharged due to being pleased with the current functional level.    Patient Name: Tracey Turner MRN: [id number] DOB:1956/05/03, 68 y.o., female Today's Date: 01/04/2024  END OF SESSION:  PT End of Session - 01/04/24 1259     Visit Number 9    Number of Visits 12    Date for PT Re-Evaluation 12/26/23    Authorization Type [employer] MEDICARE    Authorization Time Period uhc approved 12 visits from 11/19/23-12/31/23    Authorization - Visit Number 8    Authorization - Number of Visits 12    Progress Note Due on Visit 10    PT Start Time 1300    PT Stop Time 1339    PT Time Calculation (min) 39 min    Activity Tolerance Patient tolerated treatment well    Behavior During Therapy [facility] for tasks assessed/performed                Past Medical History:  Diagnosis Date   Anemia    Arthritis    Back pain    Gastritis    Glaucoma    Hypertension    Knee pain    Type 2 diabetes mellitus (HCC)    Past Surgical History:  Procedure Laterality Date   COLONOSCOPY  02/13/2011   internal hemorrhoids   ESOPHAGOGASTRODUODENOSCOPY  02/13/2011   mild gatritis   GIVENS CAPSULE STUDY  03/28/2012       GIVENS CAPSULE STUDY  03/26/2012   Procedure: GIVENS CAPSULE STUDY;  Surgeon: Margo LITTIE Haddock, MD;  Location: [facility];  Service: Endoscopy;  Laterality: N/A;  7:30   None     Patient Active Problem List   Diagnosis Date Noted   Low back pain 11/25/2021   Hyperlipidemia 04/15/2021   Varicose veins of both legs with edema 02/02/2020   Ocular  hypertension 12/05/2019   GERD (gastroesophageal reflux disease) 06/15/2014   Morbid obesity (HCC) 12/18/2013   Uncontrolled type 2 diabetes mellitus with hyperglycemia (HCC) 09/12/2012   Essential hypertension 09/12/2012   Arthritis of knee 02/23/2011    PCP: Cook, Jayce G, DO  REFERRING PROVIDER: Cook, Jayce G, DO  REFERRING DIAG: [id number] (ICD-10-CM) - Chronic left-sided low back pain without sciatica M17.10 (ICD-10-CM) - Arthritis of knee  Rationale for Evaluation and Treatment: Rehabilitation  THERAPY DIAG:  Other low back pain  Impaired functional mobility, balance, gait, and endurance  Weakness of both lower extremities  ONSET DATE:   SUBJECTIVE:  SUBJECTIVE STATEMENT: Pt states back is fine today, states she is excited for graduation today. Pt states she still has some twinges in her back every now and then but that the back has gotten much better. Pt states she no longer has to stop and take a break walking into work.    Pt states she ruptured disc about 20 years ago while helping handicapped brother move and make transfers. Pt states the fall on new years eve 2024 on the left side but that the right side is the side that has been bothering her. Pt states she tries to control diabetes with diet, does not like the metformin . Pt states legs feel weak and states she can tell since the fall she has slowed down considerably. Pt states she does have some right knee pain but her main concern is her back and moving faster. Pt would also like to maintain ability to work.   PERTINENT HISTORY:  -Ruptured disc 20 years ago -Clemens new years eve 2024 while going into work -Arthritis causes ankle pain -Laser surgery on legs, 2-3 years ago, still goes  -Diabetes, seeing specialist Friday  PAIN:   Are you having pain? Yes: NPRS scale: 6/10, 8-9/10 at worst Pain location: center of low back, and RLE  Pain description: aching pain, sharp with certain movements Aggravating factors: bending, twisting, lifting heavy weights, walking for a long time Relieving factors: pain patch, rest, sitting down  PRECAUTIONS: Fall  RED FLAGS: None   WEIGHT BEARING RESTRICTIONS: No  FALLS:  Has patient fallen in last 6 months? Yes. 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  DIAGNOSTIC FINDINGS:  CLINICAL DATA:  Clemens down steps landing on left side. Subsequent hip and lower back pain   EXAM: CT LUMBAR SPINE WITHOUT CONTRAST   TECHNIQUE: Multidetector CT imaging of the lumbar spine was performed without intravenous contrast administration. Multiplanar CT image reconstructions were also generated.   RADIATION DOSE REDUCTION: This exam was performed according to the departmental dose-optimization program which includes automated exposure control, adjustment of the mA and/or kV according to patient size and/or use of iterative reconstruction technique.   COMPARISON:  Lumbar spine radiographs 11/17/2011   FINDINGS: Segmentation: 5 lumbar type vertebrae.   Alignment: No evidence of traumatic listhesis.   Vertebrae: Demineralization. No acute fracture or focal pathologic process.   Paraspinal and other soft tissues: Aortic atherosclerotic calcification.   Disc levels: Multilevel  spondylosis with bulky bridging anterior osteophytes, disc space height loss, and degenerative endplate changes greatest at L3-L4 and L4-L5. Ankylosis of L4-L5. Multilevel moderate facet arthropathy. Spinal canal narrowing is greatest at L3-L4 where it is moderate secondary to posterior disc osteophyte complex and ligamentum flavum hypertrophy. Neural foraminal narrowing is greatest on the right at L3-L4 heart is moderate to advanced and on the left at L4-L5 where it is advanced.   IMPRESSION: 1. No acute fracture or evidence of traumatic listhesis. 2. Multilevel spondylosis and facet arthropathy.  PATIENT SURVEYS:  Modified Oswestry 27/50  12/28/23: 10/50 COGNITION: Overall cognitive status:  Within functional limits for tasks assessed     SENSATION: Pt states she has some neuropathy of both feet from diabetes, no numbness just doesn't feel as good as she used to.  POSTURE: rounded shoulders, forward head, and decreased lumbar lordosis  PALPATION: Pt demonstrates tenderness to palpation to right lumbar paraspinals and L1-L5 spinous processes.   LUMBAR ROM:   AROM eval  Flexion 80 degrees, pain in low back  Extension 7, little discomfort not as bad as going forward   Right lateral flexion Fingers to knee, pain in low back  Left lateral flexion Fingers to knee, pain in low back  Right rotation 25% available, no pain but limited  Left rotation 25% available, no pain but limited   (Blank rows = not tested)  LOWER EXTREMITY ROM:     Active  Right eval Left eval  Hip flexion    Hip extension    Hip abduction    Hip adduction    Hip internal rotation    Hip external rotation    Knee flexion    Knee extension    Ankle dorsiflexion    Ankle plantarflexion    Ankle inversion    Ankle eversion     (Blank rows = not tested)  LOWER EXTREMITY MMT:    MMT Right eval Left eval  Hip flexion 3 3  Hip extension 4 4  Hip abduction 3+ 3+  Hip adduction 3 3  Hip internal  rotation    Hip external rotation    Knee flexion 3+ 3+  Knee extension 4 4  Ankle dorsiflexion 4- 4-  Ankle plantarflexion    Ankle inversion    Ankle eversion     (Blank rows = not tested)    FUNCTIONAL TESTS:  5 times sit to stand: 32.11 2 minute walk test: 152 ft 12/28/23: : 238 ft 5TSTS: 25.81 seconds  GAIT: Distance walked: about 250 feet throughout session Assistive device utilized: None Level of assistance: Complete Independence Comments: Decreased gait quality and velocity. Pt demonstrates decreased stance time on RLE as two minute walk test progresses, pt reports due to pain in RLE.  TREATMENT DATE:  01/04/2024  Therapeutic Exercise: -Nu-step, level 3 resistance, 75 spm, 5 minutes -Single knee to chest, 1 set of 10 reps, 5 second holds, bilaterally, with green strap, RLE decreased knee flexion noted -Supine bridges, 2 sets of 10 reps, 3 second holds, pt cued for max hip extension -SLR, 1 set of 10 reps bilaterally -Modified LTR for hip activation, 2 sets of 20 reps  01/01/2024  Therapeutic Exercise: -Walking 5 minutes around clinic, pt consistently reaching for wall, pt states more strain in side of hips than the back -Single knee to chest, 1 set of 10 reps, 5 second holds, bilaterally, with green strap, RLE decreased knee flexion noted -Supine bridges, 2 sets of 10 reps, 3 second holds, GTB at knees, pt cued for max hip extension -Modified LTR for hip activation, IR tight bilaterally, 2 sets of 15 reps -Step ups, 1 set of 5 reps, 8 inch step, pt cued for leading LE and one UE -Sit to stands with 5lb and 10lb dumbbell, 2 sets of 5 reps, pt cued for core activation, pt cued for increased core activation  12/28/2023  Progress note: , Modified Oswestry, 5TSTS Therapeutic Exercise: - -Standing shoulder extensions, 2 sets of 10 reps, pt cued for controlled eccentric phase -3 way kicks, 1 set of 8 reps, 3 pound weights at ankles, BUE support -Ankle  weight  walks, 3 pounds, 2 laps, walking marches/buttkicks  Therapeutic Activity: -Sit to stands, 2 sets of 10 reps, pt cued for core activation -Sled pushes and pulls, 40lbs, 2 laps, 50 foot laps, pt cued for upright posture -Stair navigation, 4 bouts, 4 stairs, pt cued for one foot per stair, pt requires BUE support  PATIENT EDUCATION:  Education details: Pt was educated on findings of PT evaluation, prognosis, frequency of therapy visits and rationale, attendance policy, and HEP if given.   Person educated: Patient Education method: Explanation, Verbal cues, and Handouts Education comprehension: verbalized understanding, verbal cues required, and needs further education  HOME EXERCISE PROGRAM: Access Code: [email or web address] URL: [email or web address] Date: 11/14/2023 Prepared by: Lang Ada  Exercises - Supine Lower Trunk Rotation  - 1 x daily - 7 x weekly - 3 sets - 10 reps - Sit to Stand with Armchair  - 1 x daily - 7 x weekly - 3 sets - 10 reps   Access Code: [email or web address] URL: [email or web address] Date: 12/05/2023 Prepared by: Rosaria Powell-Butler  Exercises - Hooklying Clamshell with Resistance  - 2 x daily - 7 x weekly - 3 sets - 10 reps - Supine Hip Adduction Isometric with Ball  - 2 x daily - 7 x weekly - 3 sets - 10 reps - 5 hold - Supine Bridge  - 2 x daily - 7 x weekly - 3 sets - 10 reps  ASSESSMENT:  CLINICAL IMPRESSION: Patient continues to demonstrate increased core/LE strength, increased gait quality and functional mobility. Pt demonstrates interest in community resources mentioned and pt is encouraged to partake in some sort of organized exercises to maintain results. Patient able to continue dynamic balance and core activation exercises today with supine bridges and straight leg raises, good performance with verbal cueing. Patient to be discharged to HEP at this time due to decreased low back pain, improved functional mobility, meeting majority of  goals, and pt being satisfied with current level of function.       OBJECTIVE IMPAIRMENTS: Abnormal gait, decreased activity tolerance, decreased balance, decreased endurance, decreased mobility, difficulty walking, decreased strength, impaired sensation, obesity, and pain.   ACTIVITY LIMITATIONS: carrying, lifting, bending, standing, squatting, stairs, transfers, bed mobility, and dressing  PARTICIPATION LIMITATIONS: meal prep, cleaning, laundry, driving, community activity, occupation, and yard work  PERSONAL FACTORS: Fitness, Past/current experiences, Time since onset of injury/illness/exacerbation, and 1-2 comorbidities: diabetes, working 10 hour shifts in which she is required to stand for 9/10 hours are also affecting patient's functional outcome.   REHAB POTENTIAL: Good  CLINICAL DECISION MAKING: Evolving/moderate complexity  EVALUATION COMPLEXITY: Low   GOALS: Goals reviewed with patient? Yes  SHORT TERM GOALS: Target date: 12/05/23  Pt will be independent with HEP in order to demonstrate participation in Physical Therapy POC.  Baseline: Goal status: MET  2.  Pt will report 6/10 pain at worst with mobility in order to demonstrate improved pain with ADLs.  Baseline: 8-9/10 Goal status: MET  LONG TERM GOALS: Target date: 12/26/23  Pt will improve 5TSTS by 2.3 seconds in order to demonstrate improved functional strength to return to desired activities.  Baseline: see objective.  Goal status: MET  2.  Pt will improve 2 MWT by 140 in order to demonstrate improved functional ambulatory capacity in community setting.  Baseline: see objective.  Goal status: IN PROGRESS  3.  Pt will improve Modified Oswestry score by 6 in order to demonstrate improved pain with functional goals and outcomes. Baseline: see objective.  Goal status:  MET  4.  Pt will report 4/10 pain with mobility in order to demonstrate reduced pain with ADLs lasting greater than 30 minutes.  Baseline:  see objective.  Goal status: MET   PLAN:  PT FREQUENCY: 2x/week  PT DURATION: 6 weeks  PLANNED INTERVENTIONS: 97110-Therapeutic exercises, 97530- Therapeutic activity, 97112- Neuromuscular re-education, 97535- Self Care, 02859- Manual therapy, [phone]- Gait training, Patient/Family education, Balance training, Stair training, Joint mobilization, Spinal mobilization, Manual lymph drainage, DME instructions, Cryotherapy, and Moist heat.  PLAN FOR NEXT SESSION: discharged   Lang Ada, PT, DPT [facility] Office: [phone] 1:44 PM, 01/04/24

## 2024-01-10 ENCOUNTER — Ambulatory Visit: Admitting: Family Medicine

## 2024-01-15 ENCOUNTER — Other Ambulatory Visit: Payer: Self-pay | Admitting: Family Medicine

## 2024-01-18 ENCOUNTER — Other Ambulatory Visit: Payer: Self-pay | Admitting: Family Medicine

## 2024-01-18 ENCOUNTER — Encounter: Payer: Self-pay | Admitting: Dietician

## 2024-01-18 ENCOUNTER — Encounter: Attending: Endocrinology | Admitting: Dietician

## 2024-01-18 VITALS — Ht 66.0 in | Wt 300.0 lb

## 2024-01-18 DIAGNOSIS — E1165 Type 2 diabetes mellitus with hyperglycemia: Secondary | ICD-10-CM | POA: Insufficient documentation

## 2024-01-18 NOTE — Patient Instructions (Signed)
 Plan:  Aim for 2-3 Carb Choices per meal (30-45 grams) +/- 1 either way  Aim for 0-1 Carbs per snack if hungry  Include protein in moderation with your meals and snacks Consider reading food labels for Total Carbohydrate of foods Consider  increasing your activity level by walking and doing PT exercises for 30 minutes daily as tolerated Continue checking BG at alternate times per day  Continue taking medication as directed by MD  Consider nutrition quality of food choices Lean protein Increased vegetables (half the plate)

## 2024-01-18 NOTE — Progress Notes (Signed)
 Diabetes Self-Management Education  Visit Type: First/Initial  Appt. Start Time: 0955 Appt. End Time: 1110  01/18/2024  [name], identified by name and date of birth, is a 68 y.o. female with a diagnosis of Diabetes: Type 2.   ASSESSMENT Referral:  Type 2 Diabetes - Dr. Mercie  History includes:  Type 2 Diabetes (~2008), Glaucoma (watch list), HTN, anemia, back pain (finished with PT on 12/2023) Medications:  Ozempic , glipizide , lasix  prn, metformin  (stopped as she didn't tolerate), iron, calcium  and vitamin D, MVI, omega 3 Labs:  A1C 9% 10/12/2023 increased from 7.9% on 07/10/2023 Lipid Panel     Component Value Date/Time   CHOL 162 10/12/2023 1503   TRIG 214 (H) 10/12/2023 1503   HDL 52 10/12/2023 1503   CHOLHDL 3.1 10/12/2023 1503   LDLCALC 75 10/12/2023 1503   LABVLDL 35 10/12/2023 1503   66 300 lbs 01/18/2024  - losing weight with Ozempic  306 lbs 11/16/2023  Patient lives with her brother.  She does most of the shopping and cooking but he does help some. Low sodium diet. She is retired but works for [employer]. She does see a therapist.  Height 5' 6 (1.676 m), weight 300 lb (136.1 kg). Body mass index is 48.42 kg/m.   Diabetes Self-Management Education - 01/18/24 1009       Visit Information   Visit Type First/Initial      Initial Visit   Diabetes Type Type 2    Date Diagnosed 2008    Are you currently following a meal plan? Yes    What type of meal plan do you follow? trying to watch what she eats and eat less    Are you taking your medications as prescribed? Yes      Health Coping   How would you rate your overall health? Good      Psychosocial Assessment   Patient Belief/Attitude about Diabetes Motivated to manage diabetes   but afraid as she has seen what it has done to her family   What is the hardest part about your diabetes right now, causing you the most concern, or is the most worrisome to you about your diabetes?   Making healty food  and beverage choices    Self-care barriers None    Self-management support Doctor's office    Other persons present Patient    Patient Concerns Nutrition/Meal planning    Special Needs None    Preferred Learning Style No preference indicated    Learning Readiness Ready    How often do you need to have someone help you when you read instructions, pamphlets, or other written materials from your doctor or pharmacy? 1 - Never    What is the last grade level you completed in school? 12      Pre-Education Assessment   Patient understands the diabetes disease and treatment process. Needs Instruction    Patient understands incorporating nutritional management into lifestyle. Needs Instruction    Patient undertands incorporating physical activity into lifestyle. Needs Instruction    Patient understands using medications safely. Needs Instruction    Patient understands monitoring blood glucose, interpreting and using results Needs Instruction    Patient understands prevention, detection, and treatment of acute complications. Needs Instruction    Patient understands prevention, detection, and treatment of chronic complications. Needs Instruction    Patient understands how to develop strategies to address psychosocial issues. Needs Instruction    Patient understands how to develop strategies to promote health/change behavior. Needs  Instruction      Complications   Last HgB A1C per patient/outside source 9 %   10/12/2023 increased from 7.9% 07/10/2023   How often do you check your blood sugar? 1-2 times/day    Fasting Blood glucose range (mg/dL) 29-870    Number of hypoglycemic episodes per month 2    Can you tell when your blood sugar is low? Yes    What do you do if your blood sugar is low? eats something    Number of hyperglycemic episodes ( >200mg /dL): Never   not since starting Ozempic    Can you tell when your blood sugar is high? No    Have you had a dilated eye exam in the past 12 months? Yes     Have you had a dental exam in the past 12 months? Yes    Are you checking your feet? Yes    How many days per week are you checking your feet? 7      Dietary Intake   Breakfast NABS this morning    Snack (morning) NABS or graham crackers or vanilla wafers, occasional banana    Lunch fried chicken (removed skin), bread    Snack (afternoon) occasional popcorn or chips    Dinner Hot dog on a bun with chili    Snack (evening) occasional graham crackers    Beverage(s) water, diet soda, coffee with creamer and equal      Activity / Exercise   Activity / Exercise Type Light (walking / raking leaves)    How many days per week do you exercise? 3    How many minutes per day do you exercise? 15    Total minutes per week of exercise 45      Patient Education   Previous Diabetes Education Yes   years ago but doesn't remember   Disease Pathophysiology Definition of diabetes, type 1 and 2, and the diagnosis of diabetes;Explored patient's options for treatment of their diabetes    Healthy Eating Role of diet in the treatment of diabetes and the relationship between the three main macronutrients and blood glucose level;Food label reading, portion sizes and measuring food.;Plate Method;Meal options for control of blood glucose level and chronic complications.    Being Active Role of exercise on diabetes management, blood pressure control and cardiac health.;Helped patient identify appropriate exercises in relation to his/her diabetes, diabetes complications and other health issue.    Medications Reviewed patients medication for diabetes, action, purpose, timing of dose and side effects.    Monitoring Identified appropriate SMBG and/or A1C goals.;Daily foot exams;Yearly dilated eye exam    Acute complications Taught prevention, symptoms, and  treatment of hypoglycemia - the 15 rule.;Discussed and identified patients' prevention, symptoms, and treatment of hyperglycemia.    Chronic complications  Relationship between chronic complications and blood glucose control    Diabetes Stress and Support Identified and addressed patients feelings and concerns about diabetes;Worked with patient to identify barriers to care and solutions      Individualized Goals (developed by patient)   Nutrition General guidelines for healthy choices and portions discussed    Physical Activity Exercise 3-5 times per week;30 minutes per day    Medications take my medication as prescribed    Monitoring  Test my blood glucose as discussed    Problem Solving Eating Pattern    Reducing Risk treat hypoglycemia with 15 grams of carbs if blood glucose less than 70mg /dL;do foot checks daily;examine blood glucose patterns    Health Coping  Ask for help with psychological, social, or emotional issues      Post-Education Assessment   Patient understands the diabetes disease and treatment process. Demonstrates understanding / competency    Patient understands incorporating nutritional management into lifestyle. Needs Review    Patient undertands incorporating physical activity into lifestyle. Demonstrates understanding / competency    Patient understands using medications safely. Comphrehends key points    Patient understands monitoring blood glucose, interpreting and using results Comprehends key points    Patient understands prevention, detection, and treatment of acute complications. Demonstrates understanding / competency    Patient understands prevention, detection, and treatment of chronic complications. Demonstrates understanding / competency    Patient understands how to develop strategies to address psychosocial issues. Demonstrates understanding / competency    Patient understands how to develop strategies to promote health/change behavior. Needs Review      Outcomes   Expected Outcomes Demonstrated interest in learning. Expect positive outcomes    Future DMSE 3-4 months    Program Status Not Completed           Individualized Plan for Diabetes Self-Management Training:   Learning Objective:  Patient will have a greater understanding of diabetes self-management. Patient education plan is to attend individual and/or group sessions per assessed needs and concerns.   Plan:   Patient Instructions  Plan:  Aim for 2-3 Carb Choices per meal (30-45 grams) +/- 1 either way  Aim for 0-1 Carbs per snack if hungry  Include protein in moderation with your meals and snacks Consider reading food labels for Total Carbohydrate of foods Consider  increasing your activity level by walking and doing PT exercises for 30 minutes daily as tolerated Continue checking BG at alternate times per day  Continue taking medication as directed by MD  Consider nutrition quality of food choices Lean protein Increased vegetables (half the plate)    Expected Outcomes:  Demonstrated interest in learning. Expect positive outcomes  Education material provided: ADA - How to Thrive: A Guide for Your Journey with Diabetes, Food label handouts, Meal plan card, Snack sheet, and Diabetes Resources  If problems or questions, patient to contact team via:  Phone  Future DSME appointment: 3-4 months

## 2024-02-01 ENCOUNTER — Telehealth: Payer: Self-pay | Admitting: *Deleted

## 2024-02-01 NOTE — Telephone Encounter (Signed)
 Attempted to call patient but was unable to reach patient- Patient has office visit scheduled with PCP 02/14/24

## 2024-02-01 NOTE — Telephone Encounter (Signed)
 Copied from CRM #[id number]. Topic: General - Other >> Jan 31, 2024  1:19 PM Debby BROCKS wrote: Reason for CRM: Patient did not want to talk to Patient access center. Preferred talking to someone directly in the clinic due to her information being private for the nurses and doctors only

## 2024-02-08 NOTE — Progress Notes (Signed)
 Pt attended 11/10/2023 screening event with BP of 149/73 and blood sugar was 238, pt is diabetic. Pt noted at event that she does have a PCP. At event pt did not indicate any SDOH needs. Pt also noted that she is not a smoker and listed Medicare as her insurance at the event.   Per initial f/u [facility] sent PCP a curtesy in-basket message.   Per chart review pt does have a PCP (Jayce G. Cook; [facility]), insurance, and is a former smoker. Pt's last appt with PCP was 10/08/2023 and has an upcoming appt on 02/14/2024. Pt does not indicate any SDOH needs at this time.  No additional pt f/u to be scheduled at this time per health equity protocol.

## 2024-02-14 ENCOUNTER — Ambulatory Visit (INDEPENDENT_AMBULATORY_CARE_PROVIDER_SITE_OTHER): Admitting: Family Medicine

## 2024-02-14 ENCOUNTER — Encounter: Payer: Self-pay | Admitting: Family Medicine

## 2024-02-14 VITALS — BP 122/75 | Temp 97.9°F | Ht 66.0 in | Wt 295.0 lb

## 2024-02-14 DIAGNOSIS — G8929 Other chronic pain: Secondary | ICD-10-CM

## 2024-02-14 DIAGNOSIS — E1165 Type 2 diabetes mellitus with hyperglycemia: Secondary | ICD-10-CM

## 2024-02-14 DIAGNOSIS — I10 Essential (primary) hypertension: Secondary | ICD-10-CM | POA: Diagnosis not present

## 2024-02-14 DIAGNOSIS — M545 Low back pain, unspecified: Secondary | ICD-10-CM | POA: Diagnosis not present

## 2024-02-14 DIAGNOSIS — M1710 Unilateral primary osteoarthritis, unspecified knee: Secondary | ICD-10-CM | POA: Diagnosis not present

## 2024-02-14 DIAGNOSIS — E7800 Pure hypercholesterolemia, unspecified: Secondary | ICD-10-CM

## 2024-02-14 DIAGNOSIS — Z7985 Long-term (current) use of injectable non-insulin antidiabetic drugs: Secondary | ICD-10-CM

## 2024-02-14 MED ORDER — LIDOCAINE 5 % EX PTCH: MEDICATED_PATCH | CUTANEOUS | 0 refills | Status: DC

## 2024-02-14 MED ORDER — FLUTICASONE PROPIONATE 50 MCG/ACT NA SUSP: 2.0000 | Freq: Every day | NASAL | 5 refills | Status: AC

## 2024-02-14 MED ORDER — TRAMADOL HCL 50 MG PO TABS: 50.0000 mg | ORAL_TABLET | Freq: Two times a day (BID) | ORAL | 5 refills | Status: AC | PRN

## 2024-02-14 MED ORDER — HYDROQUINONE 4 % EX CREA: 1.0000 | TOPICAL_CREAM | Freq: Two times a day (BID) | CUTANEOUS | 1 refills | Status: DC | PRN

## 2024-02-14 NOTE — Patient Instructions (Signed)
Follow up in 6 months.  Take care  Dr. Wren Gallaga  

## 2024-02-18 NOTE — Assessment & Plan Note (Signed)
 Stable.  Continue current medications.

## 2024-02-18 NOTE — Assessment & Plan Note (Signed)
 Tramadol refilled.

## 2024-02-18 NOTE — Assessment & Plan Note (Signed)
 Lidocaine  patches refilled. Tramadol  refilled as well.

## 2024-02-18 NOTE — Assessment & Plan Note (Signed)
 Stable

## 2024-02-18 NOTE — Progress Notes (Signed)
 Subjective:  Patient ID: Tracey Turner, female    DOB: [birth year]  Age: 68 y.o. MRN: [id number]  CC:   Follow up  HPI:  68 year old female with the below mentioned medical problems presents for follow up.  BP well controlled on Triamterene /hydrochlorothiazide , Lasix , and Metoprolol .   Diabetes uncontrolled. Follows with Endo. Seeing nutrition. Recently started Ozempic  and she is tolerating.   Patient Active Problem List   Diagnosis Date Noted   Low back pain 11/25/2021   Hyperlipidemia 04/15/2021   Varicose veins of both legs with edema 02/02/2020   Ocular hypertension 12/05/2019   GERD (gastroesophageal reflux disease) 06/15/2014   Morbid obesity (HCC) 12/18/2013   Uncontrolled type 2 diabetes mellitus with hyperglycemia (HCC) 09/12/2012   Essential hypertension 09/12/2012   Arthritis of knee 02/23/2011    Social Hx   Social History   Socioeconomic History   Marital status: Single    Spouse name: Not on file   Number of children: Not on file   Years of education: some coll.   Highest education level: Not on file  Occupational History   Occupation: [employer]    Employer: [employer]  Tobacco Use   Smoking status: Former    Current packs/day: 0.00    Average packs/day: 1 pack/day for 36.9 years (36.9 ttl pk-yrs)    Types: Cigarettes    Start date: 07/22/1970    Quit date: 06/21/2007    Years since quitting: 16.6   Smokeless tobacco: Never  Vaping Use   Vaping status: Never Used  Substance and Sexual Activity   Alcohol use: No    Alcohol/week: 0.0 standard drinks of alcohol   Drug use: No   Sexual activity: Not Currently    Birth control/protection: Post-menopausal  Other Topics Concern   Not on file  Social History Narrative   Not on file   Social Drivers of Health   Financial Resource Strain: Low Risk  (02/16/2023)   Overall Financial Resource Strain (CARDIA)    Difficulty of Paying Living Expenses: Not hard at all  Food Insecurity: No Food  Insecurity (11/10/2023)   Hunger Vital Sign    Worried About Running Out of Food in the Last Year: Never true    Ran Out of Food in the Last Year: Never true  Transportation Needs: No Transportation Needs (11/10/2023)   PRAPARE - Administrator, Civil Service (Medical): No    Lack of Transportation (Non-Medical): No  Physical Activity: Inactive (02/16/2023)   Exercise Vital Sign    Days of Exercise per Week: 0 days    Minutes of Exercise per Session: 0 min  Stress: No Stress Concern Present (02/16/2023)   [employer] of Occupational Health - Occupational Stress Questionnaire    Feeling of Stress : Not at all  Social Connections: Moderately Isolated (02/16/2023)   Social Connection and Isolation Panel    Frequency of Communication with Friends and Family: More than three times a week    Frequency of Social Gatherings with Friends and Family: More than three times a week    Attends Religious Services: More than 4 times per year    Active Member of [employer] or Organizations: No    Attends Banker Meetings: Never    Marital Status: Never married    Review of Systems  Constitutional: Negative.   Cardiovascular:  Positive for leg swelling. Negative for chest pain.   Objective:  BP 122/75   Temp 97.9 F (36.6 C)  Ht 5' 6 (1.676 m)   Wt 295 lb (133.8 kg)   BMI 47.61 kg/m      02/14/2024    2:26 PM 02/14/2024    2:25 PM 01/18/2024   10:06 AM  BP/Weight  Systolic BP 122    Diastolic BP 75    Wt. (Lbs)  295 300  BMI  47.61 kg/m2 48.42 kg/m2    Physical Exam Vitals and nursing note reviewed.  Constitutional:      General: She is not in acute distress.    Appearance: Normal appearance. She is obese.  HENT:     Head: Normocephalic and atraumatic.  Eyes:     General:        Right eye: No discharge.        Left eye: No discharge.     Conjunctiva/sclera: Conjunctivae normal.  Cardiovascular:     Rate and Rhythm: Normal rate and regular rhythm.   Pulmonary:     Effort: Pulmonary effort is normal.     Breath sounds: Normal breath sounds. No wheezing, rhonchi or rales.  Neurological:     Mental Status: She is alert.  Psychiatric:        Mood and Affect: Mood normal.        Behavior: Behavior normal.     Lab Results  Component Value Date   WBC 6.0 03/27/2023   HGB 12.7 03/27/2023   HCT 40.0 03/27/2023   PLT 239 03/27/2023   GLUCOSE 305 (H) 03/27/2023   CHOL 162 10/12/2023   TRIG 214 (H) 10/12/2023   HDL 52 10/12/2023   LDLCALC 75 10/12/2023   ALT 17 03/27/2023   AST 26 03/27/2023   NA 134 (L) 03/27/2023   K 4.3 03/27/2023   CL 95 (L) 03/27/2023   CREATININE 0.81 03/27/2023   BUN 16 03/27/2023   CO2 28 03/27/2023   TSH 0.725 06/03/2021   HGBA1C 9.0 (H) 10/12/2023   MICROALBUR 2.53 (H) 09/22/2013     Assessment & Plan:  Essential hypertension Assessment & Plan: Stable. Continue current medications.  Orders: -     Fluticasone  Propionate; Place 2 sprays into both nostrils daily.  Dispense: 48 g; Refill: 5  Uncontrolled type 2 diabetes mellitus with hyperglycemia (HCC) Assessment & Plan: Tolerating Ozempic . Hopefully this will help with Diabetic control.    Chronic left-sided low back pain without sciatica Assessment & Plan: Lidocaine  patches refilled. Tramadol  refilled as well.   Orders: -     traMADol  HCl; Take 1 tablet (50 mg total) by mouth every 12 (twelve) hours as needed for moderate pain (pain score 4-6) or severe pain (pain score 7-10).  Dispense: 60 tablet; Refill: 5 -     Lidocaine ; APPLY 1 PATCH ONCE A DAY FOR 12 HOURS ON AND 12 HOURS OFF  Dispense: 30 patch; Refill: 0  Arthritis of knee Assessment & Plan: Tramadol  refilled.   Orders: -     traMADol  HCl; Take 1 tablet (50 mg total) by mouth every 12 (twelve) hours as needed for moderate pain (pain score 4-6) or severe pain (pain score 7-10).  Dispense: 60 tablet; Refill: 5  Pure hypercholesterolemia Assessment & Plan: Stable.    Other  orders -     Hydroquinone ; Apply 1 Application topically 2 (two) times daily as needed (dark patches).  Dispense: 28.35 g; Refill: 1    Follow-up:  6 months  Lailee Hoelzel Bluford DO [facility]

## 2024-02-18 NOTE — Assessment & Plan Note (Signed)
 Tolerating Ozempic . Hopefully this will help with Diabetic control.

## 2024-02-22 ENCOUNTER — Ambulatory Visit

## 2024-02-22 ENCOUNTER — Ambulatory Visit: Payer: Medicare Other

## 2024-02-22 VITALS — Ht 66.0 in | Wt 295.0 lb

## 2024-02-22 DIAGNOSIS — Z0000 Encounter for general adult medical examination without abnormal findings: Secondary | ICD-10-CM | POA: Diagnosis not present

## 2024-02-22 NOTE — Patient Instructions (Signed)
 Ms. Dillenburg,  Thank you for taking the time for your Medicare Wellness Visit. I appreciate your continued commitment to your health goals. Please review the care plan we discussed, and feel free to reach out if I can assist you further.  Medicare recommends these wellness visits once per year to help you and your care team stay ahead of potential health issues. These visits are designed to focus on prevention, allowing your provider to concentrate on managing your acute and chronic conditions during your regular appointments.  Please note that Annual Wellness Visits do not include a physical exam. Some assessments may be limited, especially if the visit was conducted virtually. If needed, we may recommend a separate in-person follow-up with your provider.  Ongoing Care Seeing your primary care provider every 3 to 6 months helps us  monitor your health and provide consistent, personalized care.   Referrals If a referral was made during today's visit and you haven't received any updates within two weeks, please contact the referred provider directly to check on the status.  Recommended Screenings:  Health Maintenance  Topic Date Due   Complete foot exam   09/15/2023   Eye exam for diabetics  12/12/2023   Flu Shot  01/18/2024   COVID-19 Vaccine (4 - 2025-26 season) 02/18/2024   Yearly kidney function blood test for diabetes  03/26/2024   Hemoglobin A1C  04/12/2024   Yearly kidney health urinalysis for diabetes  07/09/2024   Mammogram  10/07/2024   DEXA scan (bone density measurement)  02/05/2025   Medicare Annual Wellness Visit  02/21/2025   Cologuard (Stool DNA test)  08/17/2025   DTaP/Tdap/Td vaccine (2 - Td or Tdap) 04/30/2028   Pneumococcal Vaccine for age over 80  Completed   Hepatitis C Screening  Completed   Zoster (Shingles) Vaccine  Completed   HPV Vaccine  Aged Out   Meningitis B Vaccine  Aged Out   Colon Cancer Screening  Discontinued       02/22/2024    2:46 PM  Advanced  Directives  Does Patient Have a Medical Advance Directive? No  Would patient like information on creating a medical advance directive? Yes (MAU/Ambulatory/Procedural Areas - Information given)   Advance Care Planning is important because it: Ensures you receive medical care that aligns with your values, goals, and preferences. Provides guidance to your family and loved ones, reducing the emotional burden of decision-making during critical moments.  Information on Advanced Care Planning can be found at [place]  Secretary of [facility] Advance Health Care Directives Advance Health Care Directives ([email or web address]   Vision: Annual vision screenings are recommended for early detection of glaucoma, cataracts, and diabetic retinopathy. These exams can also reveal signs of chronic conditions such as diabetes and high blood pressure.  Dental: Annual dental screenings help detect early signs of oral cancer, gum disease, and other conditions linked to overall health, including heart disease and diabetes.  Please see the attached documents for additional preventive care recommendations.

## 2024-02-22 NOTE — Progress Notes (Signed)
 Subjective:   Tracey Turner is a 68 y.o. who presents for a Medicare Wellness preventive visit.  As a reminder, Annual Wellness Visits don't include a physical exam, and some assessments may be limited, especially if this visit is performed virtually. We may recommend an in-person follow-up visit with your provider if needed.  Visit Complete: Virtual I connected with  Diala F Beagle on 02/22/24 by a audio enabled telemedicine application and verified that I am speaking with the correct person using two identifiers.  Patient Location: Home  Provider Location: Home Office  I discussed the limitations of evaluation and management by telemedicine. The patient expressed understanding and agreed to proceed.  Vital Signs: Because this visit was a virtual/telehealth visit, some criteria may be missing or patient reported. Any vitals not documented were not able to be obtained and vitals that have been documented are patient reported.  VideoDeclined- This patient declined Librarian, academic. Therefore the visit was completed with audio only.  Persons Participating in Visit: Patient.  AWV Questionnaire: No: Patient Medicare AWV questionnaire was not completed prior to this visit.  Cardiac Risk Factors include: advanced age (>24men, >67 women);diabetes mellitus;dyslipidemia;hypertension     Objective:    Today's Vitals   02/22/24 1436  Weight: 295 lb (133.8 kg)  Height: 5' 6 (1.676 m)   Body mass index is 47.61 kg/m.     02/22/2024    2:46 PM 01/18/2024   10:01 AM 11/14/2023    4:06 PM 06/19/2023    7:22 AM 03/24/2023    8:33 PM 03/24/2023    4:12 PM 02/16/2023    2:46 PM  Advanced Directives  Does Patient Have a Medical Advance Directive? No No No No  No No  Would patient like information on creating a medical advance directive? Yes (MAU/Ambulatory/Procedural Areas - Information given) Yes (MAU/Ambulatory/Procedural Areas - Information given) Yes  (MAU/Ambulatory/Procedural Areas - Information given) Yes (ED - Information included in AVS) No - Patient declined  No - Patient declined    Current Medications (verified) Outpatient Encounter Medications as of 02/22/2024  Medication Sig   acetaminophen  (TYLENOL ) 650 MG CR tablet Take 650 mg by mouth every 8 (eight) hours as needed. arthritis   albuterol  (VENTOLIN  HFA) 108 (90 Base) MCG/ACT inhaler INHALE TWO PUFFS EVERY 6 HOURS AS NEEDED FOR WHEEZING OR SHORTNESS OF BREATH   Blood Glucose Monitoring Suppl DEVI 1 each by Does not apply route daily. May substitute to any manufacturer covered by patient's insurance.   Calcium  Carbonate-Vitamin D (CALCIUM  + D PO) Take by mouth daily.    cetirizine (ZYRTEC) 10 MG tablet Take 10 mg by mouth daily.   cyclobenzaprine  (FLEXERIL ) 10 MG tablet Take 1 tablet (10 mg total) by mouth 3 (three) times daily as needed for muscle spasms. Muscle spasms   ferrous sulfate  dried (SLOW FE) 160 (50 FE) MG TBCR Take 160 mg by mouth 3 (three) times daily with meals.   fluticasone  (FLONASE ) 50 MCG/ACT nasal spray Place 2 sprays into both nostrils daily.   furosemide  (LASIX ) 20 MG tablet TAKE ONE TABLET DAILY FOR EDEMA/FLUID   gabapentin  (NEURONTIN ) 100 MG capsule TAKE ONE CAPSULE TWICE DAILY   glipiZIDE  (GLUCOTROL ) 10 MG tablet Take 1 tablet (10 mg total) by mouth 2 (two) times daily before a meal.   Glucose Blood (BLOOD GLUCOSE TEST STRIPS) STRP 1 each by In Vitro route daily. May substitute to any manufacturer covered by patient's insurance.   hydroquinone  4 % cream Apply 1  Application topically 2 (two) times daily as needed (dark patches).   Lancet Device MISC 1 each by Does not apply route daily. May substitute to any manufacturer covered by patient's insurance.   latanoprost  (XALATAN ) 0.005 % ophthalmic solution INSTILL 1 DROP IN EACH EYE  AT BEDTIME   lidocaine  (LIDODERM ) 5 % APPLY 1 PATCH ONCE A DAY FOR 12 HOURS ON AND 12 HOURS OFF   metFORMIN  (GLUCOPHAGE -XR) 750 MG  24 hr tablet TAKE ONE TABLET DAILY WITH FOOD   metoprolol  tartrate (LOPRESSOR ) 50 MG tablet Take 1 tablet (50 mg total) by mouth 2 (two) times daily.   Multiple Vitamin (MULTIVITAMIN WITH MINERALS) TABS Take 1 tablet by mouth daily.   Omega-3 Fatty Acids (OMEGA 3 500 PO) Take by mouth.   Semaglutide ,0.25 or 0.5MG /DOS, 2 MG/3ML SOPN Inject 0.25 mg into the skin once a week for 4 weeks and increase to 0.5 mg weekly.   traMADol  (ULTRAM ) 50 MG tablet Take 1 tablet (50 mg total) by mouth every 12 (twelve) hours as needed for moderate pain (pain score 4-6) or severe pain (pain score 7-10).   triamterene -hydrochlorothiazide  (DYAZIDE ) 37.5-25 MG capsule Take 1 each (1 capsule total) by mouth daily.   No facility-administered encounter medications on file as of 02/22/2024.    Allergies (verified) Ace inhibitors, Penicillins, Protonix  [pantoprazole ], Norvasc  [amlodipine ], Amoxicillin, Cholecalciferol, Empagliflozin , Januvia  [sitagliptin ], Metformin  and related, Nexium [esomeprazole magnesium], Nsaids, Omeprazole , Prevacid  [lansoprazole ], and Victoza [liraglutide]   History: Past Medical History:  Diagnosis Date   Anemia    Arthritis    Back pain    Gastritis    Glaucoma    Hypertension    Knee pain    Type 2 diabetes mellitus (HCC)    Past Surgical History:  Procedure Laterality Date   COLONOSCOPY  02/13/2011   internal hemorrhoids   ESOPHAGOGASTRODUODENOSCOPY  02/13/2011   mild gatritis   GIVENS CAPSULE STUDY  03/28/2012       GIVENS CAPSULE STUDY  03/26/2012   Procedure: GIVENS CAPSULE STUDY;  Surgeon: Margo LITTIE Haddock, MD;  Location: [facility];  Service: Endoscopy;  Laterality: N/A;  7:30   None     Family History  Problem Relation Age of Onset   Prostate cancer Father    Stroke Father    Hypertension Father    Diabetes Mother    Heart disease Mother    Hypertension Mother    Heart disease Sister    Asthma Sister    Kidney disease Brother    Diabetes Brother    Hypertension  Brother    Heart disease Brother    Diabetes Brother    Heart disease Brother    Diabetes Brother    Heart disease Brother    Heart disease Unknown    Arthritis Unknown    Cancer Unknown    Diabetes Unknown    Kidney disease Unknown    Colon cancer Neg Hx    Colon polyps Neg Hx    Social History   Socioeconomic History   Marital status: Single    Spouse name: Not on file   Number of children: Not on file   Years of education: some coll.   Highest education level: Not on file  Occupational History   Occupation: [employer] [place]    Employer: [employer]  Tobacco Use   Smoking status: Former    Current packs/day: 0.00    Average packs/day: 1 pack/day for 36.9 years (36.9 ttl pk-yrs)    Types: Cigarettes  Start date: 07/22/1970    Quit date: 06/21/2007    Years since quitting: 16.6   Smokeless tobacco: Never  Vaping Use   Vaping status: Never Used  Substance and Sexual Activity   Alcohol use: No    Alcohol/week: 0.0 standard drinks of alcohol   Drug use: No   Sexual activity: Not Currently    Birth control/protection: Post-menopausal  Other Topics Concern   Not on file  Social History Narrative   Not on file   Social Drivers of Health   Financial Resource Strain: Low Risk  (02/22/2024)   Overall Financial Resource Strain (CARDIA)    Difficulty of Paying Living Expenses: Not hard at all  Food Insecurity: No Food Insecurity (02/22/2024)   Hunger Vital Sign    Worried About Running Out of Food in the Last Year: Never true    Ran Out of Food in the Last Year: Never true  Transportation Needs: No Transportation Needs (02/22/2024)   PRAPARE - Administrator, Civil Service (Medical): No    Lack of Transportation (Non-Medical): No  Physical Activity: Insufficiently Active (02/22/2024)   Exercise Vital Sign    Days of Exercise per Week: 3 days    Minutes of Exercise per Session: 30 min  Stress: No Stress Concern Present (02/22/2024)   [employer] of Occupational  Health - Occupational Stress Questionnaire    Feeling of Stress: Not at all  Social Connections: Moderately Isolated (02/22/2024)   Social Connection and Isolation Panel    Frequency of Communication with Friends and Family: More than three times a week    Frequency of Social Gatherings with Friends and Family: More than three times a week    Attends Religious Services: More than 4 times per year    Active Member of [employer] or Organizations: No    Attends Banker Meetings: Never    Marital Status: Never married    Tobacco Counseling Counseling given: Not Answered    Clinical Intake:  Pre-visit preparation completed: Yes  Pain : No/denies pain  Diabetes: Yes CBG done?: No Did pt. bring in CBG monitor from home?: No  Lab Results  Component Value Date   HGBA1C 9.0 (H) 10/12/2023   HGBA1C 7.9 (H) 07/10/2023   HGBA1C 8.5 (H) 12/15/2022     How often do you need to have someone help you when you read instructions, pamphlets, or other written materials from your doctor or pharmacy?: 1 - Never  Interpreter Needed?: No  Information entered by :: Charmaine Bloodgood LPN   Activities of Daily Living     02/22/2024    2:46 PM 03/24/2023    8:16 PM  In your present state of health, do you have any difficulty performing the following activities:  Hearing? 0 0  Vision? 0 0  Difficulty concentrating or making decisions? 0 0  Walking or climbing stairs? 0   Dressing or bathing? 0   Doing errands, shopping? 0 0  Preparing Food and eating ? N   Using the Toilet? N   In the past six months, have you accidently leaked urine? N   Do you have problems with loss of bowel control? N   Managing your Medications? N   Managing your Finances? N   Housekeeping or managing your Housekeeping? N     Patient Care Team: Cook, Jayce G, DO as PCP - General (Family Medicine) Thapa, [place], MD as Consulting Physician (Endocrinology) [facility] Patty Anes, MD as  Consulting  Physician (Vascular Surgery)  I have updated your Care Teams any recent Medical Services you may have received from other providers in the past year.     Assessment:   This is a routine wellness examination for [facility].  Hearing/Vision screen Hearing Screening - Comments:: Denies hearing difficulties   Vision Screening - Comments:: Wears rx glasses - up to date with routine eye exams with [facility]    Goals Addressed             This Visit's Progress    Remain active and independent   On track      Depression Screen     02/22/2024    2:45 PM 02/14/2024    2:31 PM 01/18/2024   10:01 AM 11/23/2023   11:07 AM 10/08/2023    1:40 PM 02/16/2023    2:52 PM 12/15/2022    1:52 PM  PHQ 2/9 Scores  PHQ - 2 Score 0 0 0 0 0 0 0  PHQ- 9 Score  0  1 1  0    Fall Risk     02/22/2024    2:46 PM 02/14/2024    2:31 PM 01/18/2024   10:00 AM 11/23/2023   11:07 AM 02/16/2023    2:55 PM  Fall Risk   Falls in the past year? 1 1 1 1  0  Number falls in past yr: 0 0 1 0 0  Injury with Fall? 1 1 1 1  0  Comment   fell off icy porch    Risk for fall due to : History of fall(s);Impaired balance/gait;Impaired mobility    No Fall Risks  Follow up Education provided;Falls prevention discussed;Falls evaluation completed    Falls prevention discussed    MEDICARE RISK AT HOME:  Medicare Risk at Home Any stairs in or around the home?: No If so, are there any without handrails?: No Home free of loose throw rugs in walkways, pet beds, electrical cords, etc?: Yes Adequate lighting in your home to reduce risk of falls?: Yes Life alert?: No Use of a cane, walker or w/c?: No Grab bars in the bathroom?: Yes Shower chair or bench in shower?: No Elevated toilet seat or a handicapped toilet?: No  TIMED UP AND GO:  Was the test performed?  No  Cognitive Function: 6CIT completed        02/22/2024    2:46 PM 02/16/2023    2:52 PM  6CIT Screen  What Year? 0 points 0 points  What month? 0  points 0 points  What time? 0 points 0 points  Count back from 20 0 points 0 points  Months in reverse 0 points 0 points  Repeat phrase 0 points 0 points  Total Score 0 points 0 points    Immunizations Immunization History  Administered Date(s) Administered   Fluad Quad(high Dose 65+) 04/15/2021   Influenza,inj,Quad PF,6+ Mos 03/13/2014, 03/29/2015, 03/02/2022   Influenza-Unspecified 04/09/2013, 03/27/2017, 03/19/2018, 04/27/2020   Moderna Sars-Covid-2 Vaccination 09/23/2019, 10/21/2019, 05/25/2020   Pneumococcal Conjugate-13 01/30/2020   Pneumococcal Polysaccharide-23 04/15/2021   Tdap 04/30/2018   Zoster Recombinant(Shingrix) 02/28/2020, 06/03/2021   Zoster, Live 02/11/2013    Screening Tests Health Maintenance  Topic Date Due   FOOT EXAM  09/15/2023   OPHTHALMOLOGY EXAM  12/12/2023   Influenza Vaccine  01/18/2024   COVID-19 Vaccine (4 - 2025-26 season) 02/18/2024   Diabetic kidney evaluation - eGFR measurement  03/26/2024   HEMOGLOBIN A1C  04/12/2024   Diabetic kidney evaluation - Urine ACR  07/09/2024   MAMMOGRAM  10/07/2024   DEXA SCAN  02/05/2025   Medicare Annual Wellness (AWV)  02/21/2025   Fecal DNA (Cologuard)  08/17/2025   DTaP/Tdap/Td (2 - Td or Tdap) 04/30/2028   Pneumococcal Vaccine: 50+ Years  Completed   Hepatitis C Screening  Completed   Zoster Vaccines- Shingrix  Completed   HPV VACCINES  Aged Out   Meningococcal B Vaccine  Aged Out   Colonoscopy  Discontinued    Health Maintenance Items Addressed: Information given on recommended vaccines ; Requesting records for last diabetic eye exam    Additional Screening:  Vision Screening: Recommended annual ophthalmology exams for early detection of glaucoma and other disorders of the eye. Is the patient up to date with their annual eye exam?  Yes  Who is the provider or what is the name of the office in which the patient attends annual eye exams? [facility]   Dental Screening: Recommended annual  dental exams for proper oral hygiene  Community Resource Referral / Chronic Care Management: CRR required this visit?  No   CCM required this visit?  No   Plan:    I have personally reviewed and noted the following in the patient's chart:   Medical and social history Use of alcohol, tobacco or illicit drugs  Current medications and supplements including opioid prescriptions. Patient is currently taking opioid prescriptions. Information provided to patient regarding non-opioid alternatives. Patient advised to discuss non-opioid treatment plan with their provider. Functional ability and status Nutritional status Physical activity Advanced directives List of other physicians Hospitalizations, surgeries, and ER visits in previous 12 months Vitals Screenings to include cognitive, depression, and falls Referrals and appointments  In addition, I have reviewed and discussed with patient certain preventive protocols, quality metrics, and best practice recommendations. A written personalized care plan for preventive services as well as general preventive health recommendations were provided to patient.   Lavelle Pfeiffer [place], [place]   [id number]   After Visit Summary: (MyChart) Due to this being a telephonic visit, the after visit summary with patients personalized plan was offered to patient via MyChart   Notes: Nothing significant to report at this time.

## 2024-02-25 NOTE — Telephone Encounter (Signed)
 Patient seen by provider 02/14/24

## 2024-02-29 ENCOUNTER — Encounter: Payer: Self-pay | Admitting: Endocrinology

## 2024-02-29 ENCOUNTER — Ambulatory Visit: Admitting: Endocrinology

## 2024-02-29 ENCOUNTER — Ambulatory Visit: Payer: Self-pay | Admitting: Endocrinology

## 2024-02-29 VITALS — BP 138/82 | HR 76 | Resp 20 | Ht 66.0 in | Wt 291.2 lb

## 2024-02-29 DIAGNOSIS — E1169 Type 2 diabetes mellitus with other specified complication: Secondary | ICD-10-CM

## 2024-02-29 DIAGNOSIS — Z7984 Long term (current) use of oral hypoglycemic drugs: Secondary | ICD-10-CM | POA: Diagnosis not present

## 2024-02-29 DIAGNOSIS — E1165 Type 2 diabetes mellitus with hyperglycemia: Secondary | ICD-10-CM | POA: Diagnosis not present

## 2024-02-29 LAB — POCT GLYCOSYLATED HEMOGLOBIN (HGB A1C): Hemoglobin A1C: 6.9 % — AB (ref 4.0–5.6)

## 2024-02-29 MED ORDER — GLIPIZIDE 5 MG PO TABS: 5.0000 mg | ORAL_TABLET | Freq: Two times a day (BID) | ORAL | 3 refills | Status: DC

## 2024-02-29 MED ORDER — SEMAGLUTIDE (1 MG/DOSE) 4 MG/3ML ~~LOC~~ SOPN: 1.0000 mg | PEN_INJECTOR | SUBCUTANEOUS | 3 refills | Status: DC

## 2024-02-29 NOTE — Patient Instructions (Signed)
 Increase ozempic  1 mg weekly.  Decrease glipizide  to 5mg  two times a day.

## 2024-02-29 NOTE — Progress Notes (Signed)
 Outpatient Endocrinology Note Tracey Enrique Weiss, MD   Patient's Name: Tracey Turner    DOB: 01-25-1956    MRN: [id number]                                                    REASON OF VISIT: Follow-up for type 2 diabetes mellitus  REFERRING PROVIDER: Cook, Jayce G, DO  PCP: Cook, Jayce G, DO  HISTORY OF PRESENT ILLNESS:   Tracey Turner is a 68 y.o. old female with past medical history listed below, is here for follow-up for type 2 diabetes mellitus.   Pertinent Diabetes History: Patient was referred for evaluation and management of uncontrolled type 2 diabetes mellitus.  Initial consult on Nov 16, 2023.    Patient was diagnosed with type 2 diabetes mellitus in the timeframe of 2009/2010.  She has relatively acceptable control of diabetes mellitus in late 2021 with hemoglobin A1c in the range of 7%.  Starting 2022 she has worsening diabetes control with hemoglobin A1c in the range of 8 to 9%.  She has not been on insulin  therapy.  History of DKA or diabetes related hospitalizations: none  Previous diabetes education: Yes in the remote past.  Family h/o diabetes mellitus: type 2 DM in mother and brothers.    No personal history of pancreatitis and / or family history of medullary thyroid  carcinoma or MEN 2B syndrome.   Patient reports she had taken Victoza, Trulicity , Jardiance , Januvia  in the past as mentioned below were stopped due to side effects.  Chronic Diabetes Complications : Retinopathy: no. Last ophthalmology exam was done on annually, following with ophthalmology regularly.  Nephropathy: no Peripheral neuropathy: no Coronary artery disease: no Stroke: on  Relevant comorbidities and cardiovascular risk factors: Obesity: yes Body mass index is 47 kg/m.  Hypertension: Yes  Hyperlipidemia : Yes, not on statin   Current / Home Diabetic regimen includes:  Glipizide  10 mg two times a day.  Ozempic  0.5 mg weekly.  Prior diabetic medications: Metformin  : Stopped due to GI  intolerance.  Jardiance   / Javuvia : Stopped due to muscle and joint pain.  Trulicity  / Victoza caused tires and weak. Stopped few years ago.    Glycemic data:   Accu-Chek guide me glucometer data from August 29 to February 29, 2024 reviewed, average blood sugar 103.  She has been checking blood sugar 1-2 times a day usually in the morning fasting some of the fasting blood sugar 91, 96, 108, 112, 130, 111, 104, 69, 99.  Hypoglycemia: Patient has minor  hypoglycemic episodes. Patient has hypoglycemia awareness.  Factors modifying glucose control: 1.  Diabetic diet assessment: 2-3 meals a day.  Occasional regular sodas.  2.  Staying active or exercising:   3.  Medication compliance: compliant all of the time.  Interval history  She has improvement on diabetes control with hemoglobin A1c 6.9%.  Congratulated her.  Glucometer data as reviewed above.  She reports she has occasional hypoglycemic symptoms.  Diabetes regimen as reviewed and noted above.  She has complaints of numbness and tingling of the feet.  She has no other complaints today.  REVIEW OF SYSTEMS As per history of present illness.   PAST MEDICAL HISTORY: Past Medical History:  Diagnosis Date   Anemia    Arthritis    Back pain    Gastritis  Glaucoma    Hypertension    Knee pain    Type 2 diabetes mellitus (HCC)     PAST SURGICAL HISTORY: Past Surgical History:  Procedure Laterality Date   COLONOSCOPY  02/13/2011   internal hemorrhoids   ESOPHAGOGASTRODUODENOSCOPY  02/13/2011   mild gatritis   GIVENS CAPSULE STUDY  03/28/2012       GIVENS CAPSULE STUDY  03/26/2012   Procedure: GIVENS CAPSULE STUDY;  Surgeon: Margo LITTIE Haddock, MD;  Location: [facility];  Service: Endoscopy;  Laterality: N/A;  7:30   None      ALLERGIES: Allergies  Allergen Reactions   Ace Inhibitors Shortness Of Breath, Swelling, Anaphylaxis and Other (See Comments)   Penicillins Anaphylaxis   Protonix  [Pantoprazole ] Diarrhea and  Itching    Diarrhea; can take Prevacid  without difficulty   Norvasc  [Amlodipine ] Other (See Comments)    Lower leg edema, worsening   Amoxicillin     Nausea/aggravates gastritis   Cholecalciferol Other (See Comments)    Other Reaction(s): Other (See Comments)   Empagliflozin  Other (See Comments)    Joint pain   Januvia  [Sitagliptin ] Other (See Comments)    Joint pain   Metformin  And Related Diarrhea   Nexium [Esomeprazole Magnesium] Diarrhea   Nsaids Other (See Comments)    Gastritis, sharp decrease in iron   Omeprazole  Other (See Comments)    diarrhea   Prevacid  [Lansoprazole ]     diarrhea   Victoza [Liraglutide] Other (See Comments)    out of it while taking med    FAMILY HISTORY:  Family History  Problem Relation Age of Onset   Prostate cancer Father    Stroke Father    Hypertension Father    Diabetes Mother    Heart disease Mother    Hypertension Mother    Heart disease Sister    Asthma Sister    Kidney disease Brother    Diabetes Brother    Hypertension Brother    Heart disease Brother    Diabetes Brother    Heart disease Brother    Diabetes Brother    Heart disease Brother    Heart disease Unknown    Arthritis Unknown    Cancer Unknown    Diabetes Unknown    Kidney disease Unknown    Colon cancer Neg Hx    Colon polyps Neg Hx     SOCIAL HISTORY: Social History   Socioeconomic History   Marital status: Single    Spouse name: Not on file   Number of children: Not on file   Years of education: some coll.   Highest education level: Not on file  Occupational History   Occupation: [employer]    Employer: [employer]  Tobacco Use   Smoking status: Former    Current packs/day: 0.00    Average packs/day: 1 pack/day for 36.9 years (36.9 ttl pk-yrs)    Types: Cigarettes    Start date: 07/22/1970    Quit date: 06/21/2007    Years since quitting: 16.7   Smokeless tobacco: Never  Vaping Use   Vaping status: Never Used  Substance and Sexual Activity    Alcohol use: No    Alcohol/week: 0.0 standard drinks of alcohol   Drug use: No   Sexual activity: Not Currently    Birth control/protection: Post-menopausal  Other Topics Concern   Not on file  Social History Narrative   Not on file   Social Drivers of Health   Financial Resource Strain: Low Risk  (02/22/2024)  Overall Financial Resource Strain (CARDIA)    Difficulty of Paying Living Expenses: Not hard at all  Food Insecurity: No Food Insecurity (02/22/2024)   Hunger Vital Sign    Worried About Running Out of Food in the Last Year: Never true    Ran Out of Food in the Last Year: Never true  Transportation Needs: No Transportation Needs (02/22/2024)   PRAPARE - Administrator, Civil Service (Medical): No    Lack of Transportation (Non-Medical): No  Physical Activity: Insufficiently Active (02/22/2024)   Exercise Vital Sign    Days of Exercise per Week: 3 days    Minutes of Exercise per Session: 30 min  Stress: No Stress Concern Present (02/22/2024)   [employer] of Occupational Health - Occupational Stress Questionnaire    Feeling of Stress: Not at all  Social Connections: Moderately Isolated (02/22/2024)   Social Connection and Isolation Panel    Frequency of Communication with Friends and Family: More than three times a week    Frequency of Social Gatherings with Friends and Family: More than three times a week    Attends Religious Services: More than 4 times per year    Active Member of [employer] or Organizations: No    Attends Banker Meetings: Never    Marital Status: Never married    MEDICATIONS:  Current Outpatient Medications  Medication Sig Dispense Refill   acetaminophen  (TYLENOL ) 650 MG CR tablet Take 650 mg by mouth every 8 (eight) hours as needed. arthritis     albuterol  (VENTOLIN  HFA) 108 (90 Base) MCG/ACT inhaler INHALE TWO PUFFS EVERY 6 HOURS AS NEEDED FOR WHEEZING OR SHORTNESS OF BREATH 8.5 g 0   Blood Glucose Monitoring Suppl DEVI 1 each  by Does not apply route daily. May substitute to any manufacturer covered by patient's insurance. 1 each 0   Calcium  Carbonate-Vitamin D (CALCIUM  + D PO) Take by mouth daily.      cetirizine (ZYRTEC) 10 MG tablet Take 10 mg by mouth daily.     cyclobenzaprine  (FLEXERIL ) 10 MG tablet Take 1 tablet (10 mg total) by mouth 3 (three) times daily as needed for muscle spasms. Muscle spasms 90 tablet 1   ferrous sulfate  dried (SLOW FE) 160 (50 FE) MG TBCR Take 160 mg by mouth 3 (three) times daily with meals.     fluticasone  (FLONASE ) 50 MCG/ACT nasal spray Place 2 sprays into both nostrils daily. 48 g 5   furosemide  (LASIX ) 20 MG tablet TAKE ONE TABLET DAILY FOR EDEMA/FLUID 90 tablet 1   gabapentin  (NEURONTIN ) 100 MG capsule TAKE ONE CAPSULE TWICE DAILY 180 capsule 1   Glucose Blood (BLOOD GLUCOSE TEST STRIPS) STRP 1 each by In Vitro route daily. May substitute to any manufacturer covered by patient's insurance. 100 each 3   hydroquinone  4 % cream Apply 1 Application topically 2 (two) times daily as needed (dark patches). 28.35 g 1   Lancet Device MISC 1 each by Does not apply route daily. May substitute to any manufacturer covered by patient's insurance. 1 each 0   latanoprost  (XALATAN ) 0.005 % ophthalmic solution INSTILL 1 DROP IN EACH EYE  AT BEDTIME 7.5 mL 5   lidocaine  (LIDODERM ) 5 % APPLY 1 PATCH ONCE A DAY FOR 12 HOURS ON AND 12 HOURS OFF 30 patch 0   metoprolol  tartrate (LOPRESSOR ) 50 MG tablet Take 1 tablet (50 mg total) by mouth 2 (two) times daily. 180 tablet 3   Multiple Vitamin (MULTIVITAMIN WITH MINERALS) TABS Take  1 tablet by mouth daily.     OVER THE COUNTER MEDICATION Take 1 tablet by mouth daily. Omega XL     Semaglutide , 1 MG/DOSE, 4 MG/3ML SOPN Inject 1 mg as directed once a week. 6 mL 3   traMADol  (ULTRAM ) 50 MG tablet Take 1 tablet (50 mg total) by mouth every 12 (twelve) hours as needed for moderate pain (pain score 4-6) or severe pain (pain score 7-10). 60 tablet 5    triamterene -hydrochlorothiazide  (DYAZIDE ) 37.5-25 MG capsule Take 1 each (1 capsule total) by mouth daily. 90 capsule 3   glipiZIDE  (GLUCOTROL ) 5 MG tablet Take 1 tablet (5 mg total) by mouth 2 (two) times daily before a meal. 180 tablet 3   No current facility-administered medications for this visit.    PHYSICAL EXAM: Vitals:   02/29/24 1038  BP: 138/82  Pulse: 76  Resp: 20  SpO2: 96%  Weight: 291 lb 3.2 oz (132.1 kg)  Height: 5' 6 (1.676 m)   Body mass index is 47 kg/m.  Wt Readings from Last 3 Encounters:  02/29/24 291 lb 3.2 oz (132.1 kg)  02/22/24 295 lb (133.8 kg)  02/14/24 295 lb (133.8 kg)    General: Well developed, well nourished female in no apparent distress.  HEENT: AT/[place], no external lesions.  Eyes: Conjunctiva clear and no icterus. Neck: Neck supple  Lungs: Respirations not labored Neurologic: Alert, oriented, normal speech Extremities / Skin: Dry.  Psychiatric: Does not appear depressed or anxious   Diabetic Foot Exam - Simple   No data filed     LABS Reviewed Lab Results  Component Value Date   HGBA1C 6.9 (A) 02/29/2024   HGBA1C 9.0 (H) 10/12/2023   HGBA1C 7.9 (H) 07/10/2023   No results found for: FRUCTOSAMINE Lab Results  Component Value Date   CHOL 162 10/12/2023   HDL 52 10/12/2023   LDLCALC 75 10/12/2023   TRIG 214 (H) 10/12/2023   CHOLHDL 3.1 10/12/2023   Lab Results  Component Value Date   MICRALBCREAT 17 07/10/2023   MICRALBCREAT 49 (H) 12/15/2022   Lab Results  Component Value Date   CREATININE 0.81 03/27/2023   No results found for: GFR  ASSESSMENT / PLAN  1. Type 2 diabetes mellitus with other specified complication, without long-term current use of insulin  (HCC)   2. Uncontrolled type 2 diabetes mellitus with hyperglycemia (HCC)     Diabetes Mellitus type 2, complicated by no other known complications. - Diabetic status / severity: Uncontrolled.  Lab Results  Component Value Date   HGBA1C 6.9 (A) 02/29/2024     - Hemoglobin A1c goal : <6.5%  Due to previous side effect has limited options for antidiabetic medications.  She had taken Victoza and Trulicity  few years ago, reports complaints of tiredness and weakness, denies sickness, GI upset.  Discussed about trying GLP-1 receptor agonist, she agreed.  She has no known contraindication for GLP-1 receptor agonist at this time.  - Medications: See below.  I) decrease glipizide  from 10 mg 2 times a day to 5 mg 2 times a day.  Occasional hypoglycemia.   II) increase Ozempic  from 0.5 to 1 mg weekly.   - Home glucose testing: Advised to check blood sugar in the morning fasting and occasionally at bedtime, sent prescription for test supplies.  - Discussed/ Gave Hypoglycemia treatment plan.  # Consult : Refer to diabetic educator for diet plan.  # Annual urine for microalbuminuria/ creatinine ratio, no microalbuminuria currently. Last  Lab Results  Component Value  Date   MICRALBCREAT 17 07/10/2023    # Foot check nightly.  # Annual dilated diabetic eye exams.   - Diet: Make healthy diabetic food choices - Life style / activity / exercise: Discussed  2. Blood pressure  -  BP Readings from Last 1 Encounters:  02/29/24 138/82    - Control is in target.  - No change in current plans.  3. Lipid status / Hyperlipidemia - Last  Lab Results  Component Value Date   LDLCALC 75 10/12/2023   - Currently not on a statin.  Patient reports she had muscle ache and joint pain from prior two statin therapy.  Diagnoses and all orders for this visit:  Type 2 diabetes mellitus with other specified complication, without long-term current use of insulin  (HCC) -     POCT glycosylated hemoglobin (Hb A1C)  Uncontrolled type 2 diabetes mellitus with hyperglycemia (HCC) -     glipiZIDE  (GLUCOTROL ) 5 MG tablet; Take 1 tablet (5 mg total) by mouth 2 (two) times daily before a meal. -     Semaglutide , 1 MG/DOSE, 4 MG/3ML SOPN; Inject 1 mg as directed  once a week.    DISPOSITION Follow up in clinic in 3  months suggested.   All questions answered and patient verbalized understanding of the plan.  Tracey Aria Jarrard, MD [facility] Endocrinology [facility] [address] [place], [address] [place], [place] [address] Phone # [phone]  At least part of this note was generated using voice recognition software. Inadvertent word errors may have occurred, which were not recognized during the proofreading process.

## 2024-03-10 DIAGNOSIS — H40053 Ocular hypertension, bilateral: Secondary | ICD-10-CM | POA: Diagnosis not present

## 2024-03-22 ENCOUNTER — Other Ambulatory Visit: Payer: Self-pay | Admitting: Family Medicine

## 2024-03-22 DIAGNOSIS — G8929 Other chronic pain: Secondary | ICD-10-CM

## 2024-04-09 ENCOUNTER — Other Ambulatory Visit: Payer: Self-pay | Admitting: Family Medicine

## 2024-04-11 ENCOUNTER — Ambulatory Visit: Admitting: Dietician

## 2024-05-06 ENCOUNTER — Other Ambulatory Visit: Payer: Self-pay | Admitting: Family Medicine

## 2024-05-06 DIAGNOSIS — G8929 Other chronic pain: Secondary | ICD-10-CM

## 2024-05-07 ENCOUNTER — Other Ambulatory Visit: Payer: Self-pay | Admitting: Family Medicine

## 2024-06-05 ENCOUNTER — Ambulatory Visit: Payer: Self-pay | Admitting: Endocrinology

## 2024-06-05 ENCOUNTER — Encounter: Payer: Self-pay | Admitting: Endocrinology

## 2024-06-05 ENCOUNTER — Ambulatory Visit: Admitting: Endocrinology

## 2024-06-05 ENCOUNTER — Other Ambulatory Visit

## 2024-06-05 VITALS — BP 138/80 | Ht 66.0 in | Wt 285.0 lb

## 2024-06-05 DIAGNOSIS — E785 Hyperlipidemia, unspecified: Secondary | ICD-10-CM

## 2024-06-05 DIAGNOSIS — E1169 Type 2 diabetes mellitus with other specified complication: Secondary | ICD-10-CM | POA: Diagnosis not present

## 2024-06-05 DIAGNOSIS — E1159 Type 2 diabetes mellitus with other circulatory complications: Secondary | ICD-10-CM

## 2024-06-05 DIAGNOSIS — E118 Type 2 diabetes mellitus with unspecified complications: Secondary | ICD-10-CM

## 2024-06-05 DIAGNOSIS — Z7985 Long-term (current) use of injectable non-insulin antidiabetic drugs: Secondary | ICD-10-CM | POA: Diagnosis not present

## 2024-06-05 LAB — POCT GLYCOSYLATED HEMOGLOBIN (HGB A1C): Hemoglobin A1C: 6.3 % — AB (ref 4.0–5.6)

## 2024-06-05 MED ORDER — BLOOD GLUCOSE TEST VI STRP: 1.0000 | ORAL_STRIP | Freq: Every day | 3 refills | Status: AC

## 2024-06-05 MED ORDER — LANCETS MISC: 1.0000 | 3 refills | Status: AC

## 2024-06-05 NOTE — Progress Notes (Signed)
 Outpatient Endocrinology Note Linsay Vogt, MD   Patient's Name: Tracey Turner    DOB: 26-Jun-1955    MRN: [id number]                                                    REASON OF VISIT: Follow-up for type 2 diabetes mellitus  REFERRING PROVIDER: Cook, Jayce G, DO  PCP: Cook, Jayce G, DO  HISTORY OF PRESENT ILLNESS:   Tracey Turner is a 68 y.o. old female with past medical history listed below, is here for follow-up for type 2 diabetes mellitus.   Pertinent Diabetes History: Patient was referred for evaluation and management of uncontrolled type 2 diabetes mellitus.  Initial consult on Nov 16, 2023.    Patient was diagnosed with type 2 diabetes mellitus in the timeframe of 2009/2010.  She has relatively acceptable control of diabetes mellitus in late 2021 with hemoglobin A1c in the range of 7%.  Starting 2022 she has worsening diabetes control with hemoglobin A1c in the range of 8 to 9%.  She has not been on insulin  therapy.  History of DKA or diabetes related hospitalizations: none  Previous diabetes education: Yes in the remote past.  Family h/o diabetes mellitus: type 2 DM in mother and brothers.    No personal history of pancreatitis and / or family history of medullary thyroid  carcinoma or MEN 2B syndrome.   Patient reports she had taken Victoza, Trulicity , Jardiance , Januvia  in the past as mentioned below were stopped due to side effects.  Chronic Diabetes Complications : Retinopathy: no. Last ophthalmology exam was done on annually 11/2022, following with ophthalmology regularly.  Nephropathy: no Peripheral neuropathy: yes, on gabapentin  Coronary artery disease: no Stroke: on  Relevant comorbidities and cardiovascular risk factors: Obesity: yes Body mass index is 46 kg/m.  Hypertension: Yes  Hyperlipidemia : Yes, not on statin   Current / Home Diabetic regimen includes:  Glipizide  5 mg two times a day.  Ozempic  1 mg weekly.  Prior diabetic medications: Metformin   : Stopped due to GI intolerance.  Jardiance   / Javuvia : Stopped due to muscle and joint pain.  Trulicity  / Victoza caused tires and weak. Stopped few years ago.   Glycemic data:   Accu-Chek guide me glucometer data from December 4 to December 18 , 2025 reviewed, average blood sugar 93.  She has been checking blood sugar 1-2 times a day usually in the morning fasting and in the afternoon.  Some of the fasting blood sugar 92, 81, 73, 130, 100, 90, 94, 117.  Blood sugar in the afternoon 79, 135, 86, occasional hypoglycemia in the afternoon 62, 66.  Hypoglycemia: Patient has minor  hypoglycemic episodes. Patient has hypoglycemia awareness.  Factors modifying glucose control: 1.  Diabetic diet assessment: 2-3 meals a day.  Occasional regular sodas.  2.  Staying active or exercising:   3.  Medication compliance: compliant all of the time.  Interval history  Hemoglobin A1c 6.3% today congratulated her.  Diabetes regimen as reviewed and noted above.  She has been tolerating Ozempic  well, denies any GI issues.  Glucometer data as reviewed above, occasional/rare hypoglycemia mild.  She lost 10 pounds of weight in last 3 months since last visit.  Patient reports she has been overall feeling good.  She has occasional numbness and tingling of the feet.  She has been taking gabapentin .  No other complaints today.  REVIEW OF SYSTEMS As per history of present illness.   PAST MEDICAL HISTORY: Past Medical History:  Diagnosis Date   Anemia    Arthritis    Back pain    Gastritis    Glaucoma    Hypertension    Knee pain    Type 2 diabetes mellitus (HCC)     PAST SURGICAL HISTORY: Past Surgical History:  Procedure Laterality Date   COLONOSCOPY  02/13/2011   internal hemorrhoids   ESOPHAGOGASTRODUODENOSCOPY  02/13/2011   mild gatritis   GIVENS CAPSULE STUDY  03/28/2012       GIVENS CAPSULE STUDY  03/26/2012   Procedure: GIVENS CAPSULE STUDY;  Surgeon: Margo LITTIE Haddock, MD;  Location: [facility];  Service: Endoscopy;  Laterality: N/A;  7:30   None      ALLERGIES: Allergies  Allergen Reactions   Ace Inhibitors Shortness Of Breath, Swelling, Anaphylaxis and Other (See Comments)   Penicillins Anaphylaxis   Protonix  [Pantoprazole ] Diarrhea and Itching    Diarrhea; can take Prevacid  without difficulty   Norvasc  [Amlodipine ] Other (See Comments)    Lower leg edema, worsening   Amoxicillin     Nausea/aggravates gastritis   Cholecalciferol Other (See Comments)    Other Reaction(s): Other (See Comments)   Empagliflozin  Other (See Comments)    Joint pain   Januvia  [Sitagliptin ] Other (See Comments)    Joint pain   Metformin  And Related Diarrhea   Nexium [Esomeprazole Magnesium] Diarrhea   Nsaids Other (See Comments)    Gastritis, sharp decrease in iron   Omeprazole  Other (See Comments)    diarrhea   Prevacid  [Lansoprazole ]     diarrhea   Victoza [Liraglutide] Other (See Comments)    out of it while taking med    FAMILY HISTORY:  Family History  Problem Relation Age of Onset   Prostate cancer Father    Stroke Father    Hypertension Father    Diabetes Mother    Heart disease Mother    Hypertension Mother    Heart disease Sister    Asthma Sister    Kidney disease Brother    Diabetes Brother    Hypertension Brother    Heart disease Brother    Diabetes Brother    Heart disease Brother    Diabetes Brother    Heart disease Brother    Heart disease Unknown    Arthritis Unknown    Cancer Unknown    Diabetes Unknown    Kidney disease Unknown    Colon cancer Neg Hx    Colon polyps Neg Hx     SOCIAL HISTORY: Social History   Socioeconomic History   Marital status: Single    Spouse name: Not on file   Number of children: Not on file   Years of education: some coll.   Highest education level: Not on file  Occupational History   Occupation: [employer]    Employer: [employer]  Tobacco Use   Smoking status: Former    Current packs/day: 0.00     Average packs/day: 1 pack/day for 36.9 years (36.9 ttl pk-yrs)    Types: Cigarettes    Start date: 07/22/1970    Quit date: 06/21/2007    Years since quitting: 16.9   Smokeless tobacco: Never  Vaping Use   Vaping status: Never Used  Substance and Sexual Activity   Alcohol use: No    Alcohol/week: 0.0 standard drinks of alcohol  Drug use: No   Sexual activity: Not Currently    Birth control/protection: Post-menopausal  Other Topics Concern   Not on file  Social History Narrative   Not on file   Social Drivers of Health   Tobacco Use: Medium Risk (06/05/2024)   Patient History    Smoking Tobacco Use: Former    Smokeless Tobacco Use: Never    Passive Exposure: Not on file  Financial Resource Strain: Low Risk (02/22/2024)   Overall Financial Resource Strain (CARDIA)    Difficulty of Paying Living Expenses: Not hard at all  Food Insecurity: No Food Insecurity (02/22/2024)   Epic    Worried About Radiation Protection Practitioner of Food in the Last Year: Never true    Ran Out of Food in the Last Year: Never true  Transportation Needs: No Transportation Needs (02/22/2024)   Epic    Lack of Transportation (Medical): No    Lack of Transportation (Non-Medical): No  Physical Activity: Insufficiently Active (02/22/2024)   Exercise Vital Sign    Days of Exercise per Week: 3 days    Minutes of Exercise per Session: 30 min  Stress: No Stress Concern Present (02/22/2024)   [employer] of Occupational Health - Occupational Stress Questionnaire    Feeling of Stress: Not at all  Social Connections: Moderately Isolated (02/22/2024)   Social Connection and Isolation Panel    Frequency of Communication with Friends and Family: More than three times a week    Frequency of Social Gatherings with Friends and Family: More than three times a week    Attends Religious Services: More than 4 times per year    Active Member of Clubs or Organizations: No    Attends Banker Meetings: Never    Marital Status: Never  married  Depression (PHQ2-9): Low Risk (02/22/2024)   Depression (PHQ2-9)    PHQ-2 Score: 0  Alcohol Screen: Low Risk (02/22/2024)   Alcohol Screen    Last Alcohol Screening Score (AUDIT): 0  Housing: Unknown (02/22/2024)   Epic    Unable to Pay for Housing in the Last Year: No    Number of Times Moved in the Last Year: Not on file    Homeless in the Last Year: No  Utilities: Not At Risk (02/22/2024)   Epic    Threatened with loss of utilities: No  Health Literacy: Adequate Health Literacy (02/22/2024)   B1300 Health Literacy    Frequency of need for help with medical instructions: Never    MEDICATIONS:  Current Outpatient Medications  Medication Sig Dispense Refill   acetaminophen  (TYLENOL ) 650 MG CR tablet Take 650 mg by mouth every 8 (eight) hours as needed. arthritis     albuterol  (VENTOLIN  HFA) 108 (90 Base) MCG/ACT inhaler INHALE TWO PUFFS EVERY 6 HOURS AS NEEDED FOR WHEEZING OR SHORTNESS OF BREATH 8.5 g 0   Blood Glucose Monitoring Suppl DEVI 1 each by Does not apply route daily. May substitute to any manufacturer covered by patient's insurance. 1 each 0   Calcium  Carbonate-Vitamin D (CALCIUM  + D PO) Take by mouth daily.      cetirizine (ZYRTEC) 10 MG tablet Take 10 mg by mouth daily.     cyclobenzaprine  (FLEXERIL ) 10 MG tablet Take 1 tablet (10 mg total) by mouth 3 (three) times daily as needed for muscle spasms. Muscle spasms 90 tablet 1   ferrous sulfate  dried (SLOW FE) 160 (50 FE) MG TBCR Take 160 mg by mouth 3 (three) times daily with meals.  fluticasone  (FLONASE ) 50 MCG/ACT nasal spray Place 2 sprays into both nostrils daily. 48 g 5   furosemide  (LASIX ) 20 MG tablet TAKE ONE TABLET DAILY FOR EDEMA/FLUID 90 tablet 1   gabapentin  (NEURONTIN ) 100 MG capsule TAKE ONE CAPSULE TWICE DAILY 180 capsule 1   Glucose Blood (BLOOD GLUCOSE TEST STRIPS) STRP 1 each by In Vitro route daily. May substitute to any manufacturer covered by patient's insurance. 100 each 3   Glucose Blood (BLOOD  GLUCOSE TEST STRIPS) STRP 1 each by In Vitro route daily. May substitute to any manufacturer covered by patient's insurance. 100 each 3   hydroquinone  4 % cream APPLY  CREAM TOPICALLY TO DARK PATCHES TWICE DAILY AS NEEDED 29 g 0   Lancet Device MISC 1 each by Does not apply route daily. May substitute to any manufacturer covered by patient's insurance. 1 each 0   Lancets MISC 1 each by Does not apply route as directed. Dispense based on patient and insurance preference. Use up to four times daily as directed. (FOR ICD-10 E10.9, E11.9). 100 each 3   latanoprost  (XALATAN ) 0.005 % ophthalmic solution INSTILL 1 DROP IN EACH EYE  AT BEDTIME 7.5 mL 5   lidocaine  (LIDODERM ) 5 % APPLY 1 PATCH ONCE A DAY FOR 12 HOURS ON AND 12 HOURS OFF 30 patch 0   metoprolol  tartrate (LOPRESSOR ) 50 MG tablet Take 1 tablet (50 mg total) by mouth 2 (two) times daily. 180 tablet 3   Multiple Vitamin (MULTIVITAMIN WITH MINERALS) TABS Take 1 tablet by mouth daily.     OVER THE COUNTER MEDICATION Take 1 tablet by mouth daily. Omega XL     Semaglutide , 2 MG/DOSE, (OZEMPIC , 2 MG/DOSE,) 8 MG/3ML SOPN Inject 2 mg into the skin once a week. 9 mL 3   traMADol  (ULTRAM ) 50 MG tablet Take 1 tablet (50 mg total) by mouth every 12 (twelve) hours as needed for moderate pain (pain score 4-6) or severe pain (pain score 7-10). 60 tablet 5   triamterene -hydrochlorothiazide  (DYAZIDE ) 37.5-25 MG capsule Take 1 each (1 capsule total) by mouth daily. 90 capsule 3   No current facility-administered medications for this visit.    PHYSICAL EXAM: Vitals:   06/05/24 1028  BP: 138/80  Weight: 285 lb (129.3 kg)  Height: 5' 6 (1.676 m)    Body mass index is 46 kg/m.  Wt Readings from Last 3 Encounters:  06/05/24 285 lb (129.3 kg)  02/29/24 291 lb 3.2 oz (132.1 kg)  02/22/24 295 lb (133.8 kg)    General: Well developed, well nourished female in no apparent distress.  HEENT: AT/[place], no external lesions.  Eyes: Conjunctiva clear and no  icterus. Neck: Neck supple  Lungs: Respirations not labored Neurologic: Alert, oriented, normal speech Extremities / Skin: Dry.  Psychiatric: Does not appear depressed or anxious   Diabetic Foot Exam - Simple   Simple Foot Form Diabetic Foot exam was performed with the following findings: Yes 06/05/2024 10:36 AM  Visual Inspection See comments: Yes Sensation Testing Intact to touch and monofilament testing bilaterally: Yes Pulse Check Posterior Tibialis and Dorsalis pulse intact bilaterally: Yes Comments Left callus on medial toe.      LABS Reviewed Lab Results  Component Value Date   HGBA1C 6.3 (A) 06/05/2024   HGBA1C 6.9 (A) 02/29/2024   HGBA1C 9.0 (H) 10/12/2023   No results found for: FRUCTOSAMINE Lab Results  Component Value Date   CHOL 162 10/12/2023   HDL 52 10/12/2023   LDLCALC 75 10/12/2023  TRIG 214 (H) 10/12/2023   CHOLHDL 3.1 10/12/2023   Lab Results  Component Value Date   MICRALBCREAT 17 07/10/2023   MICRALBCREAT 49 (H) 12/15/2022   Lab Results  Component Value Date   CREATININE 0.81 03/27/2023   No results found for: GFR  ASSESSMENT / PLAN  1. Type 2 diabetes mellitus with other specified complication, without long-term current use of insulin  (HCC)   2. Controlled diabetes mellitus type 2 with complications (HCC)      Diabetes Mellitus type 2, complicated by peripheral neuropathy. - Diabetic status / severity: Controlled, improved.  Lab Results  Component Value Date   HGBA1C 6.3 (A) 06/05/2024    - Hemoglobin A1c goal : <6.5%  Due to previous side effect has limited options for antidiabetic medications.  Patient has improvement on diabetes control, congratulated her.  She has also been losing weight on Ozempic .   - Medications: See below.  I) increase Ozempic  from 1 to 2 mg weekly. II) stop glipizide .  She has occasional hypoglycemia.  - Home glucose testing: Advised to check blood sugar in the morning fasting and  occasionally at bedtime, sent prescription for test supplies.  - Discussed/ Gave Hypoglycemia treatment plan.  # Consult : Refer to diabetic educator for diet plan.  # Annual urine for microalbuminuria/ creatinine ratio, no microalbuminuria currently. Last  Lab Results  Component Value Date   MICRALBCREAT 17 07/10/2023    # Foot check nightly, has peripheral neuropathy, on gabapentin , managed by PCP.  # Annual dilated diabetic eye exams.   - Diet: Make healthy diabetic food choices - Life style / activity / exercise: Discussed  2. Blood pressure  -  BP Readings from Last 1 Encounters:  06/05/24 138/80    - Control is in target.  - No change in current plans.  3. Lipid status / Hyperlipidemia - Last  Lab Results  Component Value Date   LDLCALC 75 10/12/2023   - Currently not on a statin.  Patient reports she had muscle ache and joint pain from prior two statin therapy.  Managed by PCP.  Tracey Turner was seen today for diabetes.  Diagnoses and all orders for this visit:  Type 2 diabetes mellitus with other specified complication, without long-term current use of insulin  (HCC) -     POCT glycosylated hemoglobin (Hb A1C) -     Semaglutide , 2 MG/DOSE, (OZEMPIC , 2 MG/DOSE,) 8 MG/3ML SOPN; Inject 2 mg into the skin once a week. -     Glucose Blood (BLOOD GLUCOSE TEST STRIPS) STRP; 1 each by In Vitro route daily. May substitute to any manufacturer covered by patient's insurance. -     Lancets MISC; 1 each by Does not apply route as directed. Dispense based on patient and insurance preference. Use up to four times daily as directed. (FOR ICD-10 E10.9, E11.9). -     Basic metabolic panel with GFR -     Microalbumin / creatinine urine ratio  Controlled diabetes mellitus type 2 with complications (HCC) -     POCT glycosylated hemoglobin (Hb A1C)     DISPOSITION Follow up in clinic in 4  months suggested.  Labs today as ordered.   All questions answered and patient  verbalized understanding of the plan.  Haneefah Venturini, MD [facility] [address] [place], [address] [place], [place] [address] Phone # [phone]  At least part of this note was generated using voice recognition software. Inadvertent word errors may have occurred, which  were not recognized during the proofreading process.

## 2024-06-05 NOTE — Patient Instructions (Signed)
 Stop glipize  Increase Ozempic  to 2 mg weekly.

## 2024-06-06 LAB — BASIC METABOLIC PANEL WITH GFR
BUN: 17 mg/dL (ref 7–25)
CO2: 31 mmol/L (ref 20–32)
Calcium: 9.2 mg/dL (ref 8.6–10.4)
Chloride: 101 mmol/L (ref 98–110)
Creat: 0.84 mg/dL (ref 0.50–1.05)
Glucose, Bld: 61 mg/dL — ABNORMAL LOW (ref 65–99)
Potassium: 4.4 mmol/L (ref 3.5–5.3)
Sodium: 140 mmol/L (ref 135–146)
eGFR: 75 mL/min/1.73m2

## 2024-06-06 LAB — MICROALBUMIN / CREATININE URINE RATIO
Creatinine, Urine: 101 mg/dL (ref 20–275)
Microalb Creat Ratio: 7 mg/g{creat}
Microalb, Ur: 0.7 mg/dL

## 2024-06-07 ENCOUNTER — Other Ambulatory Visit: Payer: Self-pay | Admitting: Family Medicine

## 2024-06-09 ENCOUNTER — Other Ambulatory Visit: Payer: Self-pay | Admitting: Family Medicine

## 2024-06-09 DIAGNOSIS — M545 Low back pain, unspecified: Secondary | ICD-10-CM

## 2024-07-07 ENCOUNTER — Other Ambulatory Visit: Payer: Self-pay | Admitting: Family Medicine

## 2024-07-07 DIAGNOSIS — G8929 Other chronic pain: Secondary | ICD-10-CM

## 2024-08-14 ENCOUNTER — Ambulatory Visit: Admitting: Family Medicine

## 2024-10-17 ENCOUNTER — Ambulatory Visit: Admitting: Endocrinology

## 2025-02-27 ENCOUNTER — Ambulatory Visit
